# Patient Record
Sex: Male | Born: 1955 | Race: White | Hispanic: No | Marital: Married | Smoking: Former smoker
Health system: Southern US, Community
[De-identification: ages and names within clinical notes are randomized; demographics above are authoritative.]

## PROBLEM LIST (undated history)

## (undated) DIAGNOSIS — L409 Psoriasis, unspecified: Secondary | ICD-10-CM

## (undated) DIAGNOSIS — R350 Frequency of micturition: Secondary | ICD-10-CM

## (undated) DIAGNOSIS — I10 Essential (primary) hypertension: Secondary | ICD-10-CM

## (undated) DIAGNOSIS — F1020 Alcohol dependence, uncomplicated: Secondary | ICD-10-CM

## (undated) DIAGNOSIS — M79671 Pain in right foot: Secondary | ICD-10-CM

## (undated) DIAGNOSIS — M1990 Unspecified osteoarthritis, unspecified site: Secondary | ICD-10-CM

## (undated) DIAGNOSIS — Z8619 Personal history of other infectious and parasitic diseases: Secondary | ICD-10-CM

## (undated) DIAGNOSIS — Z8709 Personal history of other diseases of the respiratory system: Secondary | ICD-10-CM

## (undated) DIAGNOSIS — R351 Nocturia: Secondary | ICD-10-CM

## (undated) DIAGNOSIS — M4800 Spinal stenosis, site unspecified: Secondary | ICD-10-CM

## (undated) HISTORY — PX: APPENDECTOMY: SHX54

## (undated) HISTORY — DX: Pain in right foot: M79.671

## (undated) HISTORY — DX: Alcohol dependence, uncomplicated: F10.20

## (undated) HISTORY — DX: Spinal stenosis, site unspecified: M48.00

## (undated) HISTORY — DX: Psoriasis, unspecified: L40.9

## (undated) HISTORY — PX: WISDOM TOOTH EXTRACTION: SHX21

## (undated) HISTORY — DX: Personal history of other infectious and parasitic diseases: Z86.19

## (undated) HISTORY — PX: KNEE SURGERY: SHX244

## (undated) HISTORY — PX: HERNIA REPAIR: SHX51

## (undated) HISTORY — DX: Essential (primary) hypertension: I10

---

## 2010-11-11 ENCOUNTER — Encounter: Payer: Self-pay | Admitting: Internal Medicine

## 2010-11-11 ENCOUNTER — Ambulatory Visit (INDEPENDENT_AMBULATORY_CARE_PROVIDER_SITE_OTHER): Payer: PRIVATE HEALTH INSURANCE | Admitting: Internal Medicine

## 2010-11-11 DIAGNOSIS — M79671 Pain in right foot: Secondary | ICD-10-CM | POA: Insufficient documentation

## 2010-11-11 DIAGNOSIS — M79609 Pain in unspecified limb: Secondary | ICD-10-CM

## 2010-11-11 DIAGNOSIS — L408 Other psoriasis: Secondary | ICD-10-CM

## 2010-11-11 DIAGNOSIS — L409 Psoriasis, unspecified: Secondary | ICD-10-CM

## 2010-11-11 DIAGNOSIS — Z79899 Other long term (current) drug therapy: Secondary | ICD-10-CM

## 2010-11-11 DIAGNOSIS — G8929 Other chronic pain: Secondary | ICD-10-CM | POA: Insufficient documentation

## 2010-11-11 DIAGNOSIS — E7800 Pure hypercholesterolemia, unspecified: Secondary | ICD-10-CM

## 2010-11-11 DIAGNOSIS — Z125 Encounter for screening for malignant neoplasm of prostate: Secondary | ICD-10-CM

## 2010-11-11 MED ORDER — CALCIPOTRIENE 0.005 % EX CREA: TOPICAL_CREAM | Freq: Two times a day (BID) | CUTANEOUS | Status: DC

## 2010-11-11 MED ORDER — BETAMETHASONE DIPROPIONATE 0.05 % EX OINT: TOPICAL_OINTMENT | Freq: Two times a day (BID) | CUTANEOUS | Status: DC

## 2010-11-11 NOTE — Assessment & Plan Note (Signed)
Stable. Continue celebrex prn. Cautione regarding possible gi adverse effect. Obtain cbc, chem7.

## 2010-11-11 NOTE — Progress Notes (Signed)
  Subjective:    Patient ID: Mario Carpenter, male    DOB: 1955-12-12, 55 y.o.   MRN: [id number]  HPI Pt presents to clinic to establish primary care and for evaluation of hypercholesterolemia. Notes h/o mildly elevated cholesterol without need for medication. No formal hx of HTN but BP are intermittently high normal. Notes 1 d h/o ST, subjective fever and sick exposure. 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 Involved in AA.  Has chronic right foot pain s/p xrays and podiatry evaluation. Takes celebrex prn without GI adverse effect. Has h/o psoriasis mainly affecting elbows, umbilicus, knees and is treated successfully with dovonex and betamethasone topical. Has been evaluated by dermatology in the past. No other complaints.  Reviewed PMH, PSH, medications, allergies, social hx and family hx.    Review of Systems  Constitutional: Negative.   HENT: Negative.   Eyes: Negative.   Respiratory: Negative.   Cardiovascular: Negative.   Gastrointestinal: Negative.   Genitourinary: Negative.   Musculoskeletal: Negative.   Skin: Positive for rash. Negative for color change and pallor.  Neurological: Negative.   Hematological: Negative.   Psychiatric/Behavioral: Negative.        Objective:   Physical Exam  Nursing note and vitals reviewed. Constitutional: He appears well-developed and well-nourished. No distress.  HENT:  Head: Normocephalic and atraumatic.  Right Ear: External ear normal.  Left Ear: External ear normal.  Nose: Nose normal.  Eyes: Conjunctivae are normal. Right eye exhibits no discharge. Left eye exhibits no discharge. No scleral icterus.  Neck: Neck supple. No JVD present. No thyromegaly present.  Cardiovascular: Normal rate, regular rhythm and normal heart sounds.  Exam reveals no gallop and no friction rub.   No murmur heard. Pulmonary/Chest: Effort normal and breath sounds normal. No respiratory distress. He has no wheezes. He has no rales.  Abdominal:  Soft. Bowel sounds are normal. He exhibits no distension. There is no tenderness. There is no rebound and no guarding.  Lymphadenopathy:    He has no cervical adenopathy.  Neurological: He is alert.  Skin: Skin is warm and dry. Rash noted. He is not diaphoretic.  Psychiatric: He has a normal mood and affect.          Assessment & Plan:

## 2010-11-11 NOTE — Assessment & Plan Note (Signed)
Reportedly mild. Discussed low fat diet and regular exercise. Obtain FLP

## 2010-11-11 NOTE — Assessment & Plan Note (Signed)
Stable and mild. RF dovonex and betamethasone

## 2010-11-11 NOTE — Assessment & Plan Note (Signed)
Obtain psa 

## 2010-11-12 DIAGNOSIS — Z125 Encounter for screening for malignant neoplasm of prostate: Secondary | ICD-10-CM

## 2010-11-12 DIAGNOSIS — Z79899 Other long term (current) drug therapy: Secondary | ICD-10-CM

## 2010-11-12 DIAGNOSIS — E7800 Pure hypercholesterolemia, unspecified: Secondary | ICD-10-CM

## 2010-11-12 LAB — BASIC METABOLIC PANEL
BUN: 14 mg/dL (ref 6–23)
Chloride: 105 mEq/L (ref 96–112)
Creatinine, Ser: 1.1 mg/dL (ref 0.4–1.5)
GFR: 74.69 mL/min (ref >60.00)
Glucose, Bld: 101 mg/dL — ABNORMAL HIGH (ref 70–99)
Potassium: 4.7 mEq/L (ref 3.5–5.1)

## 2010-11-12 LAB — CBC WITH DIFFERENTIAL/PLATELET
Basophils Relative: 0.9 % (ref 0.0–3.0)
Eosinophils Absolute: 0.2 10*3/uL (ref 0.0–0.7)
Eosinophils Relative: 4.2 % (ref 0.0–5.0)
Hemoglobin: 14.7 g/dL (ref 13.0–17.0)
Lymphocytes Relative: 29.9 % (ref 12.0–46.0)
MCHC: 34.5 g/dL (ref 30.0–36.0)
Monocytes Relative: 16.6 % — ABNORMAL HIGH (ref 3.0–12.0)
Neutro Abs: 2.8 10*3/uL (ref 1.4–7.7)
Neutrophils Relative %: 48.4 % (ref 43.0–77.0)
RBC: 4.71 Mil/uL (ref 4.22–5.81)
WBC: 5.8 10*3/uL (ref 4.5–10.5)

## 2010-11-12 LAB — LIPID PANEL
Total CHOL/HDL Ratio: 4
Triglycerides: 59 mg/dL (ref 0.0–149.0)

## 2010-11-12 NOTE — Progress Notes (Signed)
Addended by: Rita Ohara on: 11/12/2010 08:11 AM   Modules accepted: Orders

## 2010-11-15 ENCOUNTER — Telehealth: Payer: Self-pay

## 2010-11-15 NOTE — Telephone Encounter (Signed)
Pt aware.

## 2010-11-15 NOTE — Telephone Encounter (Signed)
Message copied by Kyung Rudd on Mon Nov 15, 2010  2:12 PM ------      Message from: Letitia Libra, [place]      Created: Sun Nov 14, 2010  9:28 AM       Labs nl

## 2011-02-10 ENCOUNTER — Telehealth: Payer: Self-pay | Admitting: Internal Medicine

## 2011-02-10 NOTE — Telephone Encounter (Signed)
Triage vm-------------having for 2 days now lasting 30 minutes each, squiggly lines in his vision. Please advise of what to do?

## 2011-02-11 ENCOUNTER — Ambulatory Visit: Payer: PRIVATE HEALTH INSURANCE | Admitting: Internal Medicine

## 2011-02-11 NOTE — Telephone Encounter (Signed)
appt made

## 2011-02-14 ENCOUNTER — Ambulatory Visit: Payer: PRIVATE HEALTH INSURANCE | Admitting: Internal Medicine

## 2011-06-21 ENCOUNTER — Ambulatory Visit (INDEPENDENT_AMBULATORY_CARE_PROVIDER_SITE_OTHER): Payer: PRIVATE HEALTH INSURANCE | Admitting: Internal Medicine

## 2011-06-21 ENCOUNTER — Encounter: Payer: Self-pay | Admitting: Internal Medicine

## 2011-06-21 DIAGNOSIS — J069 Acute upper respiratory infection, unspecified: Secondary | ICD-10-CM

## 2011-06-21 DIAGNOSIS — K121 Other forms of stomatitis: Secondary | ICD-10-CM

## 2011-06-21 DIAGNOSIS — G8929 Other chronic pain: Secondary | ICD-10-CM

## 2011-06-21 DIAGNOSIS — M79609 Pain in unspecified limb: Secondary | ICD-10-CM

## 2011-06-21 DIAGNOSIS — K1370 Unspecified lesions of oral mucosa: Secondary | ICD-10-CM

## 2011-06-21 MED ORDER — TRIAMCINOLONE ACETONIDE 0.1 % MT PSTE: PASTE | OROMUCOSAL | Status: DC

## 2011-06-21 MED ORDER — AMOXICILLIN 875 MG PO TABS: 875.0000 mg | ORAL_TABLET | Freq: Two times a day (BID) | ORAL | Status: AC

## 2011-06-21 MED ORDER — MELOXICAM 7.5 MG PO TABS: ORAL_TABLET | ORAL | Status: AC

## 2011-06-25 DIAGNOSIS — J069 Acute upper respiratory infection, unspecified: Secondary | ICD-10-CM | POA: Insufficient documentation

## 2011-06-25 DIAGNOSIS — K121 Other forms of stomatitis: Secondary | ICD-10-CM | POA: Insufficient documentation

## 2011-06-25 NOTE — Progress Notes (Signed)
  Subjective:    Patient ID: Mario Carpenter, male    DOB: 03/18/1956, 55 y.o.   MRN: [id number]  HPI Pt presents to clinic for evaluation of ST. Notes two week h/o bilateral ear discomfort, fatigue and an oral ulcer located on upper hard palate. Took otc alkaseltzer and tylenol with some relief. No other alleviating or exacerbating factors. No fever, chills, cough, or known sick exposures. Has h/o chronic foot pain for which takes celebrex. Finds celebrex expensive. Tolerates without gi adverse effect. No known h/o pud or cri. No other complaints.  Past Medical History  Diagnosis Date  . Hypertension    Past Surgical History  Procedure Date  . Appendectomy   . Hernia repair   . Knee surgery     reports that he has quit smoking. He has never used smokeless tobacco. He reports that he does not drink alcohol. His drug history not on file. family history includes Diabetes in his son and Heart disease in his mother. No Known Allergies     Review of Systems see hpi     Objective:   Physical Exam  Nursing note and vitals reviewed. Constitutional: He appears well-developed and well-nourished. No distress.  HENT:  Head: Normocephalic and atraumatic.  Right Ear: Tympanic membrane, external ear and ear canal normal.  Left Ear: Tympanic membrane, external ear and ear canal normal.  Nose: Nose normal.  Mouth/Throat: Uvula is midline, oropharynx is clear and moist and mucous membranes are normal. No oropharyngeal exudate.       2mm oral ulcer left upper hard palate.  Eyes: Conjunctivae are normal. Right eye exhibits no discharge. Left eye exhibits no discharge. No scleral icterus.  Neck: Neck supple.  Lymphadenopathy:    He has no cervical adenopathy.  Neurological: He is alert.  Skin: Skin is warm and dry. No rash noted. He is not diaphoretic.  Psychiatric: He has a normal mood and affect.          Assessment & Plan:

## 2011-06-25 NOTE — Assessment & Plan Note (Signed)
Change celebrex to mobic prn for cost consideration.

## 2011-06-25 NOTE — Assessment & Plan Note (Signed)
Given duration of sx's begin abx tx. Followup if no improvement or worsening.

## 2011-06-25 NOTE — Assessment & Plan Note (Signed)
Attempt kenalog in orabase. Followup if no improvement or worsening.

## 2011-08-26 ENCOUNTER — Telehealth: Payer: Self-pay | Admitting: *Deleted

## 2011-08-26 NOTE — Telephone Encounter (Signed)
Patient returned phone call and left voice message and stated that he has not had any ear pain or fever. He denies dizziness. He just has a sensation of fluid in his ear. Call was returned to patient at [phone], no answer.  A detailed voice message was left  Advising patient to [place] a antihistamine per Dr Rodena Medin, and if there is no improvement he was advised to schedule appointment  For evaluation.

## 2011-08-26 NOTE — Telephone Encounter (Signed)
Patient called and left voice message stating he has had fluid in his ear for the past  2 days, and would like to know if Dr Rodena Medin would prescribe something for him

## 2011-08-26 NOTE — Telephone Encounter (Signed)
Call returned to patient at [phone], no answer. A detailed voice message was left for patient to return call with more details regarding his symptoms.

## 2011-09-08 ENCOUNTER — Encounter: Payer: Self-pay | Admitting: Internal Medicine

## 2011-09-08 ENCOUNTER — Ambulatory Visit (INDEPENDENT_AMBULATORY_CARE_PROVIDER_SITE_OTHER): Payer: PRIVATE HEALTH INSURANCE | Admitting: Internal Medicine

## 2011-09-08 DIAGNOSIS — J40 Bronchitis, not specified as acute or chronic: Secondary | ICD-10-CM

## 2011-09-08 MED ORDER — DOXYCYCLINE HYCLATE 100 MG PO TABS: 100.0000 mg | ORAL_TABLET | Freq: Two times a day (BID) | ORAL | Status: AC

## 2011-09-08 NOTE — Assessment & Plan Note (Signed)
Begin doxycycline. Followup if no improvement or worsening.  

## 2011-09-08 NOTE — Progress Notes (Signed)
  Subjective:    Patient ID: Mario Carpenter, male    DOB: 02/17/1956, 56 y.o.   MRN: [id number]  HPI Pt presents to clinic for evaluation of cough. Notes one + week h/o right ear popping and discomfort-improved temporarily with antihistamine. Then developed throat irritation and NP cough for at least the last 4 days. Cough was productive this am for brown sputum without hemoptysis. Taking otc alkaseltzer with some improvement of sx's. No fever, chills, dyspnea or wheezing. No other complaints.  Past Medical History  Diagnosis Date  . Hypertension    Past Surgical History  Procedure Date  . Appendectomy   . Hernia repair   . Knee surgery     reports that he has quit smoking. He has never used smokeless tobacco. He reports that he does not drink alcohol. His drug history not on file. family history includes Diabetes in his son and Heart disease in his mother. No Known Allergies   Review of Systems see hpi     Objective:   Physical Exam  Nursing note and vitals reviewed. Constitutional: He appears well-developed. No distress.  HENT:  Head: Normocephalic and atraumatic.  Right Ear: External ear and ear canal normal. A middle ear effusion is present.  Left Ear: Tympanic membrane, external ear and ear canal normal.  Nose: Nose normal.  Mouth/Throat: Uvula is midline and mucous membranes are normal. Posterior oropharyngeal erythema present. No oropharyngeal exudate, posterior oropharyngeal edema or tonsillar abscesses.  Eyes: Conjunctivae are normal. No scleral icterus.  Neck: Neck supple.  Cardiovascular: Normal rate and normal heart sounds.   Pulmonary/Chest: Effort normal and breath sounds normal. No respiratory distress. He has no wheezes. He has no rales.  Neurological: He is alert.  Skin: Skin is warm and dry. He is not diaphoretic.  Psychiatric: He has a normal mood and affect.          Assessment & Plan:

## 2011-10-20 ENCOUNTER — Telehealth: Payer: Self-pay | Admitting: Internal Medicine

## 2011-10-20 MED ORDER — SILDENAFIL CITRATE 100 MG PO TABS: 100.0000 mg | ORAL_TABLET | Freq: Every day | ORAL | Status: DC | PRN

## 2011-10-20 NOTE — Telephone Encounter (Signed)
Rx refill sent to pharmacy. 

## 2011-10-20 NOTE — Telephone Encounter (Signed)
Please send in refill

## 2011-11-15 ENCOUNTER — Ambulatory Visit: Payer: PRIVATE HEALTH INSURANCE | Admitting: Internal Medicine

## 2012-03-04 ENCOUNTER — Other Ambulatory Visit: Payer: Self-pay | Admitting: Internal Medicine

## 2012-03-05 NOTE — Telephone Encounter (Signed)
Refill sent to pharmacy.   

## 2012-03-05 NOTE — Telephone Encounter (Signed)
Please advise if ok to provide refills and if so, how many? Pt last seen by [place] on 09/08/11 and cancelled 11/2011 appt.   Medication name:  Name from pharmacy:  meloxicam (MOBIC) 7.5 MG tablet  MELOXICAM 7.5MG  TAB ZYDU Sig: TAKE ONE OR TWO TABLETS BY MOUTH DAILY AS NEEDED FOR PAIN. TAKE WITHFOOD Dispense: 60 tablet Refills: 2 Start: 03/04/2012 Class: Normal Requested on: 06/21/2011 Last refill: 02/09/2012

## 2012-03-05 NOTE — Telephone Encounter (Signed)
Ok one month with [employer] [facility]

## 2012-05-29 ENCOUNTER — Ambulatory Visit: Payer: PRIVATE HEALTH INSURANCE | Admitting: Internal Medicine

## 2012-06-22 ENCOUNTER — Telehealth: Payer: Self-pay | Admitting: Internal Medicine

## 2012-06-22 MED ORDER — SILDENAFIL CITRATE 100 MG PO TABS: 100.0000 mg | ORAL_TABLET | Freq: Every day | ORAL | Status: DC | PRN

## 2012-06-22 NOTE — Telephone Encounter (Signed)
Refill viagra 100 mg tab qty 10 take 1 by mouth daily as needed last fill 05-02-2012

## 2012-06-22 NOTE — Telephone Encounter (Signed)
Please advise; re- refills

## 2012-06-22 NOTE — Telephone Encounter (Signed)
OK to send 1 month with 5 refills.

## 2012-09-04 ENCOUNTER — Encounter: Payer: PRIVATE HEALTH INSURANCE | Admitting: Internal Medicine

## 2012-09-12 ENCOUNTER — Encounter: Payer: Self-pay | Admitting: *Deleted

## 2012-09-12 ENCOUNTER — Ambulatory Visit (INDEPENDENT_AMBULATORY_CARE_PROVIDER_SITE_OTHER): Payer: PRIVATE HEALTH INSURANCE | Admitting: Internal Medicine

## 2012-09-12 ENCOUNTER — Encounter: Payer: Self-pay | Admitting: Internal Medicine

## 2012-09-12 VITALS — BP 142/94 | HR 72 | Temp 98.2°F | Resp 16 | Ht 73.25 in | Wt 248.5 lb

## 2012-09-12 DIAGNOSIS — Z23 Encounter for immunization: Secondary | ICD-10-CM

## 2012-09-12 DIAGNOSIS — J40 Bronchitis, not specified as acute or chronic: Secondary | ICD-10-CM

## 2012-09-12 DIAGNOSIS — Z0000 Encounter for general adult medical examination without abnormal findings: Secondary | ICD-10-CM

## 2012-09-12 LAB — HEPATIC FUNCTION PANEL
AST: 28 U/L (ref 0–37)
Alkaline Phosphatase: 47 U/L (ref 39–117)
Bilirubin, Direct: 0.2 mg/dL (ref 0.0–0.3)
Indirect Bilirubin: 1.2 mg/dL — ABNORMAL HIGH (ref 0.0–0.9)
Total Bilirubin: 1.4 mg/dL — ABNORMAL HIGH (ref 0.3–1.2)

## 2012-09-12 LAB — CBC WITH DIFFERENTIAL/PLATELET
Eosinophils Absolute: 0.2 10*3/uL (ref 0.0–0.7)
Eosinophils Relative: 3 % (ref 0–5)
Lymphs Abs: 1.8 10*3/uL (ref 0.7–4.0)
MCH: 29.5 pg (ref 26.0–34.0)
MCV: 84.7 fL (ref 78.0–100.0)
Platelets: 270 10*3/uL (ref 150–400)
RBC: 4.98 MIL/uL (ref 4.22–5.81)

## 2012-09-12 LAB — BASIC METABOLIC PANEL
CO2: 27 mEq/L (ref 19–32)
Calcium: 9.7 mg/dL (ref 8.4–10.5)
Creat: 0.99 mg/dL (ref 0.50–1.35)
Glucose, Bld: 105 mg/dL — ABNORMAL HIGH (ref 70–99)

## 2012-09-12 LAB — PSA: PSA: 1.64 ng/mL (ref <=4.00)

## 2012-09-12 LAB — LIPID PANEL: HDL: 53 mg/dL (ref >39)

## 2012-09-12 LAB — HEMOGLOBIN A1C: Mean Plasma Glucose: 120 mg/dL — ABNORMAL HIGH (ref <117)

## 2012-09-12 MED ORDER — VARDENAFIL HCL 20 MG PO TABS: 20.0000 mg | ORAL_TABLET | Freq: Every day | ORAL | Status: DC | PRN

## 2012-09-13 LAB — URINALYSIS, ROUTINE W REFLEX MICROSCOPIC
Nitrite: NEGATIVE
Specific Gravity, Urine: 1.022 (ref 1.005–1.030)
Urobilinogen, UA: 0.2 mg/dL (ref 0.0–1.0)

## 2012-09-16 DIAGNOSIS — Z0000 Encounter for general adult medical examination without abnormal findings: Secondary | ICD-10-CM | POA: Insufficient documentation

## 2012-09-16 NOTE — Assessment & Plan Note (Signed)
Nl exam. ekg shows nsr with rate of 67. Nl intervals and axis. Obtain cpe labs. Recommend outpt bp log to be submitted for review

## 2012-09-16 NOTE — Progress Notes (Signed)
  Subjective:    Patient ID: Mario Carpenter, male    DOB: Jun 03, 1956, 57 y.o.   MRN: [id number]  HPI Pt presents to clinic for annual exam. BP mildly high without symptoms. Past h/o HTN and took medication but was able to stop. Is beginning cardio exercise program soon.  Past Medical History  Diagnosis Date  . Hypertension    Past Surgical History  Procedure Date  . Appendectomy   . Hernia repair   . Knee surgery     reports that he has quit smoking. He has never used smokeless tobacco. He reports that he does not drink alcohol. His drug history not on file. family history includes Diabetes in his son and Heart disease in his mother. No Known Allergies   Review of Systems see hpi     Objective:   Physical Exam  Physical Exam  Nursing note and vitals reviewed. Constitutional: He appears well-developed and well-nourished. No distress.  HENT:  Head: Normocephalic and atraumatic.  Right Ear: Tympanic membrane and external ear normal.  Left Ear: Tympanic membrane and external ear normal.  Nose: Nose normal.  Mouth/Throat: Uvula is midline, oropharynx is clear and moist and mucous membranes are normal. No oropharyngeal exudate.  Eyes: Conjunctivae and EOM are normal. Pupils are equal, round, and reactive to light. Right eye exhibits no discharge. Left eye exhibits no discharge. No scleral icterus.  Neck: Neck supple. Carotid bruit is not present. No thyromegaly present.  Cardiovascular: Normal rate, regular rhythm and normal heart sounds.  Exam reveals no gallop and no friction rub.   No murmur heard. Pulmonary/Chest: Effort normal and breath sounds normal. No respiratory distress. He has no wheezes. He has no rales.  Abdominal: Soft. He exhibits no distension and no mass. There is no hepatosplenomegaly. There is no tenderness. There is no rebound. Hernia confirmed negative in the right inguinal area and confirmed negative in the left inguinal area.  Lymphadenopathy:    He has  no cervical adenopathy.  Neurological: He is alert.  Skin: Skin is warm and dry. He is not diaphoretic.  Psychiatric: He has a normal mood and affect.        Assessment & Plan:

## 2013-03-13 ENCOUNTER — Ambulatory Visit: Payer: PRIVATE HEALTH INSURANCE | Admitting: Internal Medicine

## 2013-03-20 ENCOUNTER — Encounter: Payer: Self-pay | Admitting: Family

## 2013-03-20 ENCOUNTER — Ambulatory Visit (INDEPENDENT_AMBULATORY_CARE_PROVIDER_SITE_OTHER): Payer: PRIVATE HEALTH INSURANCE | Admitting: Family

## 2013-03-20 ENCOUNTER — Ambulatory Visit (HOSPITAL_BASED_OUTPATIENT_CLINIC_OR_DEPARTMENT_OTHER)
Admission: RE | Admit: 2013-03-20 | Discharge: 2013-03-20 | Disposition: A | Discharge status: Home / Self Care | Payer: PRIVATE HEALTH INSURANCE | Source: Ambulatory Visit | Attending: Family | Admitting: Family

## 2013-03-20 VITALS — BP 140/90 | HR 68 | Temp 98.0°F | Resp 16 | Ht 73.25 in | Wt 243.1 lb

## 2013-03-20 DIAGNOSIS — M542 Cervicalgia: Secondary | ICD-10-CM | POA: Insufficient documentation

## 2013-03-20 DIAGNOSIS — Q762 Congenital spondylolisthesis: Secondary | ICD-10-CM | POA: Insufficient documentation

## 2013-03-20 DIAGNOSIS — M47812 Spondylosis without myelopathy or radiculopathy, cervical region: Secondary | ICD-10-CM | POA: Insufficient documentation

## 2013-03-20 DIAGNOSIS — R209 Unspecified disturbances of skin sensation: Secondary | ICD-10-CM

## 2013-03-20 DIAGNOSIS — W57XXXA Bitten or stung by nonvenomous insect and other nonvenomous arthropods, initial encounter: Secondary | ICD-10-CM

## 2013-03-20 DIAGNOSIS — R200 Anesthesia of skin: Secondary | ICD-10-CM

## 2013-03-20 DIAGNOSIS — M5090 Cervical disc disorder, unspecified, unspecified cervical region: Secondary | ICD-10-CM

## 2013-03-20 DIAGNOSIS — T148 Other injury of unspecified body region: Secondary | ICD-10-CM

## 2013-03-20 NOTE — Patient Instructions (Addendum)
Please complete your neck x ray on the first floor. Follow up in 6 months for complete physical.   Work on low fat/low cholesterol diet.

## 2013-03-20 NOTE — Progress Notes (Signed)
  Subjective:    Patient ID: Arlen Dupuis, male    DOB: 15-Oct-1955, 57 y.o.   MRN: [id number]  HPI  Mr.  Jerolyn Center is a 57 yr old male who presents today with several concerns:  R thumb numbness x 6 months.  His orthopedic doctor told him that he thinks this is due to his neck.  Tick bite 2 months ago.  Thinks that there may be a small piece of the tick imbedded in his skin.  Denies joint pain, fever, rashes.    Review of Systems See HPI  Past Medical History  Diagnosis Date  . Hypertension     History   Social History  . Marital Status: Married    Spouse Name: N/A    Number of Children: N/A  . Years of Education: N/A   Occupational History  . Not on file.   Social History Main Topics  . Smoking status: Former Games developer  . Smokeless tobacco: Never Used  . Alcohol Use: No  . Drug Use: Not on file  . Sexually Active: Not on file   Other Topics Concern  . Not on file   Social History Narrative  . No narrative on file    Past Surgical History  Procedure Laterality Date  . Appendectomy    . Hernia repair    . Knee surgery      Family History  Problem Relation Age of Onset  . Heart disease Mother   . Diabetes Son     type 1    No Known Allergies  Current Outpatient Prescriptions on File Prior to Visit  Medication Sig Dispense Refill  . betamethasone dipropionate (DIPROLENE) 0.05 % ointment Apply topically 2 (two) times daily.  60 g  6  . calcipotriene (DOVONOX) 0.005 % cream Apply topically 2 (two) times daily.  60 g  6  . meloxicam (MOBIC) 7.5 MG tablet TAKE ONE OR TWO TABLETS BY MOUTH DAILY AS NEEDED FOR PAIN. TAKE WITHFOOD  60 tablet  4  . Multiple Vitamin (MULTIVITAMIN) tablet Take 1 tablet by mouth daily.      . vardenafil (LEVITRA) 20 MG tablet Take 1 tablet (20 mg total) by mouth daily as needed for erectile dysfunction.  10 tablet  6   No current facility-administered medications on file prior to visit.    BP 140/90  Pulse 68  Temp(Src) 98  F (36.7 C) (Oral)  Resp 16  Ht 6' 1.25" (1.861 m)  Wt 243 lb 1.3 oz (110.26 kg)  BMI 31.84 kg/m2  SpO2 99%       Objective:   Physical Exam  Constitutional: He appears well-developed and well-nourished. No distress.  Cardiovascular: Normal rate and regular rhythm.   No murmur heard. Pulmonary/Chest: Effort normal and breath sounds normal. No respiratory distress. He has no wheezes. He has no rales. He exhibits no tenderness.  Neurological:  bilatater UE strength is 5/5  Skin:  Small lesion beneath right axilla with dark piece of solid matter in center          Assessment & Plan:

## 2013-03-21 ENCOUNTER — Telehealth: Payer: Self-pay | Admitting: Family

## 2013-03-21 DIAGNOSIS — M5090 Cervical disc disorder, unspecified, unspecified cervical region: Secondary | ICD-10-CM

## 2013-03-21 NOTE — Telephone Encounter (Signed)
Notified pt. He states he does not want to proceed with referral at this time as he doesn't feel his symptoms are "bad enough" at present.

## 2013-03-21 NOTE — Telephone Encounter (Signed)
Pls let pt know that x ray shows some pinched nerves in his neck as well as arthrititis.  Since he is symptomatic, I would recommend that he be evaluated by neurosurgery- pended.

## 2013-03-21 NOTE — Telephone Encounter (Signed)
Noted  

## 2013-03-24 DIAGNOSIS — W57XXXA Bitten or stung by nonvenomous insect and other nonvenomous arthropods, initial encounter: Secondary | ICD-10-CM | POA: Insufficient documentation

## 2013-03-24 DIAGNOSIS — M5090 Cervical disc disorder, unspecified, unspecified cervical region: Secondary | ICD-10-CM | POA: Insufficient documentation

## 2013-03-24 NOTE — Assessment & Plan Note (Signed)
Tic residue was removed using a 22 gauge needle. Monitor.

## 2013-03-24 NOTE — Assessment & Plan Note (Signed)
Likely cause for thumb numbness. C spine x ray as below.  Recommended referral to NS, but pt refuses. See phone note.  1. 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  Disease is less severe at C4-5 and C6-7.  2. Osseous foraminal narrowing is present bilaterally at each of  these levels, most severe at C3-4.

## 2013-05-16 ENCOUNTER — Other Ambulatory Visit: Payer: Self-pay | Admitting: Family

## 2013-05-16 NOTE — Telephone Encounter (Signed)
Received refill from pharmacy for Viagra. Pt currently has levitra listed on med list. Need to verify request. Left message for pt to return my call.

## 2013-05-30 NOTE — Telephone Encounter (Signed)
Pt never returned my call. Looks like he has taken viagra in the past.  Please advise re: request.

## 2013-09-17 ENCOUNTER — Encounter: Payer: PRIVATE HEALTH INSURANCE | Admitting: Family

## 2013-09-20 ENCOUNTER — Ambulatory Visit (INDEPENDENT_AMBULATORY_CARE_PROVIDER_SITE_OTHER): Payer: PRIVATE HEALTH INSURANCE | Admitting: Family

## 2013-09-20 ENCOUNTER — Encounter: Payer: Self-pay | Admitting: Family

## 2013-09-20 VITALS — BP 130/84 | HR 65 | Temp 97.7°F | Resp 16 | Ht 74.5 in | Wt 250.0 lb

## 2013-09-20 DIAGNOSIS — Z0000 Encounter for general adult medical examination without abnormal findings: Secondary | ICD-10-CM

## 2013-09-20 DIAGNOSIS — Z23 Encounter for immunization: Secondary | ICD-10-CM

## 2013-09-20 DIAGNOSIS — Z136 Encounter for screening for cardiovascular disorders: Secondary | ICD-10-CM

## 2013-09-20 LAB — CBC WITH DIFFERENTIAL/PLATELET
BASOS ABS: 0.1 10*3/uL (ref 0.0–0.1)
Basophils Relative: 1 % (ref 0–1)
EOS PCT: 4 % (ref 0–5)
Eosinophils Absolute: 0.2 10*3/uL (ref 0.0–0.7)
HEMATOCRIT: 42.4 % (ref 39.0–52.0)
Hemoglobin: 14.6 g/dL (ref 13.0–17.0)
LYMPHS ABS: 1.6 10*3/uL (ref 0.7–4.0)
LYMPHS PCT: 29 % (ref 12–46)
MCH: 29.5 pg (ref 26.0–34.0)
MCHC: 34.4 g/dL (ref 30.0–36.0)
MCV: 85.7 fL (ref 78.0–100.0)
Monocytes Absolute: 0.6 10*3/uL (ref 0.1–1.0)
Monocytes Relative: 12 % (ref 3–12)
NEUTROS ABS: 2.9 10*3/uL (ref 1.7–7.7)
Neutrophils Relative %: 54 % (ref 43–77)
PLATELETS: 269 10*3/uL (ref 150–400)
RBC: 4.95 MIL/uL (ref 4.22–5.81)
RDW: 13 % (ref 11.5–15.5)
WBC: 5.3 10*3/uL (ref 4.0–10.5)

## 2013-09-20 LAB — BASIC METABOLIC PANEL WITH GFR
BUN: 14 mg/dL (ref 6–23)
CALCIUM: 9.4 mg/dL (ref 8.4–10.5)
CHLORIDE: 101 meq/L (ref 96–112)
CO2: 29 meq/L (ref 19–32)
Creat: 1.03 mg/dL (ref 0.50–1.35)
GFR, EST NON AFRICAN AMERICAN: 80 mL/min
GFR, Est African American: >89 mL/min
Glucose, Bld: 100 mg/dL — ABNORMAL HIGH (ref 70–99)
Potassium: 5 mEq/L (ref 3.5–5.3)
SODIUM: 138 meq/L (ref 135–145)

## 2013-09-20 LAB — HEPATIC FUNCTION PANEL
ALK PHOS: 42 U/L (ref 39–117)
ALT: 32 U/L (ref 0–53)
AST: 24 U/L (ref 0–37)
Albumin: 4.5 g/dL (ref 3.5–5.2)
BILIRUBIN DIRECT: 0.2 mg/dL (ref 0.0–0.3)
BILIRUBIN INDIRECT: 1.2 mg/dL — AB (ref 0.0–0.9)
TOTAL PROTEIN: 6.6 g/dL (ref 6.0–8.3)
Total Bilirubin: 1.4 mg/dL — ABNORMAL HIGH (ref 0.3–1.2)

## 2013-09-20 LAB — LIPID PANEL
CHOL/HDL RATIO: 3.8 ratio
Cholesterol: 175 mg/dL (ref 0–200)
HDL: 46 mg/dL (ref >39)
LDL CALC: 115 mg/dL — AB (ref 0–99)
TRIGLYCERIDES: 68 mg/dL (ref <150)
VLDL: 14 mg/dL (ref 0–40)

## 2013-09-20 NOTE — Progress Notes (Signed)
Pre visit review using our clinic review tool, if applicable. No additional management support is needed unless otherwise documented below in the visit note. 

## 2013-09-20 NOTE — Progress Notes (Signed)
Subjective:    Patient ID: Mario Carpenter, male    DOB: 10/14/1955, 58 y.o.   MRN: [id number]  HPI  Mario Carpenter is a 58 yr old male who presents today for cpx.  Immunizations: ? Last tetanus.  Declines flu vaccine. Last  Diet:diet is unhealthy.   Reports recovering alcoholic x 12 yrs, now eats too much sugar Exercise: got a bike for christmas, joined the gym Colonoscopy: last colo 7 yrs ago- normal per pt.     Review of Systems  Constitutional:       Wt Readings from Last 3 Encounters: 09/20/13 : 250 lb (113.399 kg) 03/20/13 : 243 lb 1.3 oz (110.26 kg) 09/12/12 : 248 lb 8 oz (112.719 kg)   HENT: Negative for hearing loss and rhinorrhea.   Eyes: Negative for visual disturbance.  Respiratory: Negative for cough and shortness of breath.   Cardiovascular: Negative for chest pain.  Gastrointestinal: Negative for nausea, vomiting, diarrhea and blood in stool.  Genitourinary: Negative for difficulty urinating.       Nocturia 1-2 times  Musculoskeletal: Negative for back pain.  Skin: Negative for rash.  Neurological: Negative for headaches.  Hematological:       Notes that his skin tears easily on his forearms with easy bruisibility  Psychiatric/Behavioral:       Denies depression/anxiety.    Past Medical History  Diagnosis Date  . Hypertension     History   Social History  . Marital Status: Married    Spouse Name: N/A    Number of Children: N/A  . Years of Education: N/A   Occupational History  . Not on file.   Social History Main Topics  . Smoking status: Former Research scientist (life sciences)  . Smokeless tobacco: Never Used  . Alcohol Use: No  . Drug Use: Not on file  . Sexual Activity: Not on file   Other Topics Concern  . Not on file   Social History Narrative  . No narrative on file    Past Surgical History  Procedure Laterality Date  . Appendectomy    . Hernia repair    . Knee surgery      Family History  Problem Relation Age of Onset  . Heart disease  Mother   . Diabetes Son     type 1    No Known Allergies  Current Outpatient Prescriptions on File Prior to Visit  Medication Sig Dispense Refill  . betamethasone dipropionate (DIPROLENE) 0.05 % ointment Apply topically 2 (two) times daily.  60 g  6  . calcipotriene (DOVONOX) 0.005 % cream Apply topically 2 (two) times daily.  60 g  6  . meloxicam (MOBIC) 7.5 MG tablet TAKE ONE OR TWO TABLETS BY MOUTH DAILY AS NEEDED FOR PAIN. TAKE WITHFOOD  60 tablet  4  . Multiple Vitamin (MULTIVITAMIN) tablet Take 1 tablet by mouth daily.      . vardenafil (LEVITRA) 20 MG tablet Take 1 tablet (20 mg total) by mouth daily as needed for erectile dysfunction.  10 tablet  6  . VIAGRA 100 MG tablet Take one tablet by mouth daily as needed  10 tablet  4   No current facility-administered medications on file prior to visit.    BP 130/84  Pulse 65  Temp(Src) 97.7 F (36.5 C) (Oral)  Resp 16  Ht 6' 2.5" (1.892 m)  Wt 250 lb (113.399 kg)  BMI 31.68 kg/m2  SpO2 99%       Objective:   Physical Exam  Physical Exam  Constitutional: He is oriented to person, place, and time. He appears well-developed and well-nourished. No distress.  HENT:  Head: Normocephalic and atraumatic.  Right Ear: Tympanic membrane and ear canal normal.  Left Ear: Tympanic membrane and ear canal normal.  Mouth/Throat: Oropharynx is clear and moist.  Eyes: Pupils are equal, round, and reactive to light. No scleral icterus.  Neck: Normal range of motion. No thyromegaly present.  Cardiovascular: Normal rate and regular rhythm.   No murmur heard. Pulmonary/Chest: Effort normal and breath sounds normal. No respiratory distress. He has no wheezes. He has no rales. He exhibits no tenderness.  Abdominal: Soft. Bowel sounds are normal. He exhibits no distension and no mass. There is no tenderness. There is no rebound and no guarding.  Musculoskeletal: He exhibits no edema.  Lymphadenopathy:    He has no cervical adenopathy.    Neurological: He is alert and oriented to person, place, and time. He has normal reflexes. He exhibits normal muscle tone. Coordination normal.  Skin: Skin is warm and dry.  Psychiatric: He has a normal mood and affect. His behavior is normal. Judgment and thought content normal.          Assessment & Plan:         Assessment & Plan:

## 2013-09-20 NOTE — Patient Instructions (Signed)
Please complete lab work prior to leaving. Please work on [employer], weight loss and continue exercise.  Follow up in 1 year, sooner if problems/concern.

## 2013-09-20 NOTE — Assessment & Plan Note (Signed)
Discussed healthy diet, exercise, weight loss. Tdap today.  Declines flu shot. Obtain fasting labs including PSA (discussed pros/cons).

## 2013-09-21 LAB — URINALYSIS
BILIRUBIN URINE: NEGATIVE
Glucose, UA: NEGATIVE mg/dL
HGB URINE DIPSTICK: NEGATIVE
KETONES UR: NEGATIVE mg/dL
Leukocytes, UA: NEGATIVE
Nitrite: NEGATIVE
PROTEIN: NEGATIVE mg/dL
SPECIFIC GRAVITY, URINE: 1.017 (ref 1.005–1.030)
UROBILINOGEN UA: 0.2 mg/dL (ref 0.0–1.0)
pH: 5.5 (ref 5.0–8.0)

## 2013-09-21 LAB — PSA: PSA: 1.45 ng/mL (ref <=4.00)

## 2013-09-21 LAB — TSH: TSH: 2.531 u[IU]/mL (ref 0.350–4.500)

## 2013-09-22 ENCOUNTER — Encounter: Payer: Self-pay | Admitting: Family

## 2014-08-15 ENCOUNTER — Other Ambulatory Visit: Payer: Self-pay | Admitting: Family

## 2014-09-22 ENCOUNTER — Ambulatory Visit (INDEPENDENT_AMBULATORY_CARE_PROVIDER_SITE_OTHER): Payer: Commercial Managed Care - PPO | Admitting: Physician Assistant

## 2014-09-22 ENCOUNTER — Ambulatory Visit (HOSPITAL_BASED_OUTPATIENT_CLINIC_OR_DEPARTMENT_OTHER)
Admission: RE | Admit: 2014-09-22 | Discharge: 2014-09-22 | Disposition: A | Discharge status: Home / Self Care | Payer: Commercial Managed Care - PPO | Source: Ambulatory Visit | Attending: Physician Assistant | Admitting: Physician Assistant

## 2014-09-22 ENCOUNTER — Encounter: Payer: Self-pay | Admitting: Physician Assistant

## 2014-09-22 VITALS — BP 147/78 | HR 68 | Temp 98.2°F | Resp 16 | Ht 73.5 in | Wt 254.5 lb

## 2014-09-22 DIAGNOSIS — M169 Osteoarthritis of hip, unspecified: Secondary | ICD-10-CM | POA: Insufficient documentation

## 2014-09-22 DIAGNOSIS — M25552 Pain in left hip: Secondary | ICD-10-CM

## 2014-09-22 DIAGNOSIS — Z0000 Encounter for general adult medical examination without abnormal findings: Secondary | ICD-10-CM | POA: Insufficient documentation

## 2014-09-22 DIAGNOSIS — L409 Psoriasis, unspecified: Secondary | ICD-10-CM | POA: Diagnosis not present

## 2014-09-22 DIAGNOSIS — Z125 Encounter for screening for malignant neoplasm of prostate: Secondary | ICD-10-CM | POA: Diagnosis not present

## 2014-09-22 DIAGNOSIS — N401 Enlarged prostate with lower urinary tract symptoms: Secondary | ICD-10-CM | POA: Diagnosis not present

## 2014-09-22 DIAGNOSIS — N138 Other obstructive and reflux uropathy: Secondary | ICD-10-CM | POA: Insufficient documentation

## 2014-09-22 LAB — HEPATIC FUNCTION PANEL
ALBUMIN: 4.2 g/dL (ref 3.5–5.2)
ALT: 29 U/L (ref 0–53)
AST: 25 U/L (ref 0–37)
Alkaline Phosphatase: 52 U/L (ref 39–117)
Bilirubin, Direct: 0.2 mg/dL (ref 0.0–0.3)
Total Bilirubin: 1 mg/dL (ref 0.2–1.2)
Total Protein: 6.8 g/dL (ref 6.0–8.3)

## 2014-09-22 LAB — LIPID PANEL
CHOL/HDL RATIO: 4
CHOLESTEROL: 181 mg/dL (ref 0–200)
HDL: 49.6 mg/dL (ref >39.00)
LDL CALC: 120 mg/dL — AB (ref 0–99)
NONHDL: 131.4
Triglycerides: 55 mg/dL (ref 0.0–149.0)
VLDL: 11 mg/dL (ref 0.0–40.0)

## 2014-09-22 LAB — BASIC METABOLIC PANEL
BUN: 16 mg/dL (ref 6–23)
CALCIUM: 9.4 mg/dL (ref 8.4–10.5)
CO2: 27 mEq/L (ref 19–32)
CREATININE: 1.01 mg/dL (ref 0.40–1.50)
Chloride: 105 mEq/L (ref 96–112)
GFR: 80.44 mL/min (ref >60.00)
GLUCOSE: 119 mg/dL — AB (ref 70–99)
Potassium: 4.3 mEq/L (ref 3.5–5.1)
Sodium: 137 mEq/L (ref 135–145)

## 2014-09-22 LAB — URINALYSIS, ROUTINE W REFLEX MICROSCOPIC
Bilirubin Urine: NEGATIVE
HGB URINE DIPSTICK: NEGATIVE
KETONES UR: NEGATIVE
LEUKOCYTES UA: NEGATIVE
Nitrite: NEGATIVE
PH: 6 (ref 5.0–8.0)
RBC / HPF: NONE SEEN (ref 0–?)
Specific Gravity, Urine: 1.02 (ref 1.000–1.030)
TOTAL PROTEIN, URINE-UPE24: NEGATIVE
UROBILINOGEN UA: 0.2 (ref 0.0–1.0)
Urine Glucose: NEGATIVE

## 2014-09-22 LAB — CBC
HEMATOCRIT: 43.9 % (ref 39.0–52.0)
Hemoglobin: 14.5 g/dL (ref 13.0–17.0)
MCHC: 32.9 g/dL (ref 30.0–36.0)
MCV: 89 fl (ref 78.0–100.0)
Platelets: 264 10*3/uL (ref 150.0–400.0)
RBC: 4.94 Mil/uL (ref 4.22–5.81)
RDW: 13.3 % (ref 11.5–15.5)
WBC: 6.1 10*3/uL (ref 4.0–10.5)

## 2014-09-22 LAB — TSH: TSH: 2.67 u[IU]/mL (ref 0.35–4.50)

## 2014-09-22 MED ORDER — MELOXICAM 7.5 MG PO TABS: ORAL_TABLET | ORAL | Status: DC

## 2014-09-22 MED ORDER — CALCIPOTRIENE 0.005 % EX CREA: TOPICAL_CREAM | Freq: Two times a day (BID) | CUTANEOUS | Status: DC

## 2014-09-22 MED ORDER — TAMSULOSIN HCL 0.4 MG PO CAPS: 0.4000 mg | ORAL_CAPSULE | Freq: Every day | ORAL | Status: DC

## 2014-09-22 MED ORDER — BETAMETHASONE DIPROPIONATE 0.05 % EX OINT: TOPICAL_OINTMENT | Freq: Two times a day (BID) | CUTANEOUS | Status: DC

## 2014-09-22 NOTE — Progress Notes (Signed)
Pre visit review using our clinic review tool, if applicable. No additional management support is needed unless otherwise documented below in the visit note/SLS  

## 2014-09-22 NOTE — Progress Notes (Signed)
Patient presents to clinic today for annual exam.  Patient is fasting for labs.  Acute Concerns: Patient complains of left hip pain x 6 months.  Pain is throbbing in nature and intermittent.  No pain at present.  Pain occurring a few times per week.  Is not worsened with exercise or ROM.  Denies numbness or tingling of extremities.  Chronic Issues: Hyperlipidemia -- Noted in chart.  Patient not currently on any medications. Is watching diet and attempting to exercise.  Is fasting for labs today.  Health Maintenance: Dental -- up-to-date Vision -- up-to-date Immunizations -- Tetanus up-to-date.  Patient declines flu shot today. Colonoscopy --  Endorses Colonoscopy in 2007. Due in 2017.  Past Medical History  Diagnosis Date  . Hypertension   . Psoriasis   . Foot pain, right   . History of chicken pox   . Alcoholism     Resolved    Past Surgical History  Procedure Laterality Date  . Appendectomy    . Hernia repair    . Knee surgery    . Wisdom tooth extraction      Current Outpatient Prescriptions on File Prior to Visit  Medication Sig Dispense Refill  . Multiple Vitamin (MULTIVITAMIN) tablet Take 1 tablet by mouth daily.    Marland Kitchen VIAGRA 100 MG tablet TAKE ONE TABLET BY MOUTH DAILY AS NEEDED  10 tablet 3   No current facility-administered medications on file prior to visit.    No Known Allergies  Family History  Problem Relation Age of Onset  . Heart disease Mother 59    Deceased  . Diabetes Son     type 1  . Heart defect Mother   . Diabetes Father 26    Deceased  . Healthy Brother     x1  . Healthy Sister     x2    History   Social History  . Marital Status: Married    Spouse Name: N/A    Number of Children: N/A  . Years of Education: N/A   Occupational History  . Not on file.   Social History Main Topics  . Smoking status: Former Research scientist (life sciences)  . Smokeless tobacco: Never Used  . Alcohol Use: No  . Drug Use: Not on file  . Sexual Activity: Not on file    Other Topics Concern  . Not on file   Social History Narrative   Review of Systems  Constitutional: Negative for fever and weight loss.  HENT: Negative for ear discharge, ear pain, hearing loss and tinnitus.   Eyes: Negative for blurred vision, double vision, photophobia and pain.  Respiratory: Negative for cough and shortness of breath.   Cardiovascular: Negative for chest pain and palpitations.  Gastrointestinal: Negative.   Genitourinary: Positive for frequency. Negative for dysuria, urgency, hematuria and flank pain.       Nocturia x 4-5 per night.  Skin: Negative for rash.  Neurological: Negative for dizziness, loss of consciousness and headaches.  Psychiatric/Behavioral: Negative.    BP 147/78 mmHg  Pulse 68  Temp(Src) 98.2 F (36.8 C) (Oral)  Resp 16  Ht 6' 1.5" (1.867 m)  Wt 254 lb 8 oz (115.44 kg)  BMI 33.12 kg/m2  SpO2 99%  Physical Exam  Constitutional: He is oriented to person, place, and time and well-developed, well-nourished, and in no distress.  HENT:  Head: Normocephalic and atraumatic.  Right Ear: External ear normal.  Left Ear: External ear normal.  Nose: Nose normal.  Mouth/Throat: Oropharynx is  clear and moist. No oropharyngeal exudate.  Eyes: Conjunctivae and EOM are normal. Pupils are equal, round, and reactive to light.  Neck: Neck supple.  Cardiovascular: Normal rate, regular rhythm, normal heart sounds and intact distal pulses.   Pulmonary/Chest: Effort normal and breath sounds normal. No respiratory distress. He has no wheezes. He has no rales. He exhibits no tenderness.  Abdominal: Soft. Bowel sounds are normal. He exhibits no distension and no mass. There is no tenderness. There is no rebound and no guarding.  Musculoskeletal:       Left hip: He exhibits decreased range of motion. He exhibits normal strength, no tenderness, no bony tenderness and no swelling.  Lymphadenopathy:    He has no cervical adenopathy.  Neurological: He is alert  and oriented to person, place, and time. No cranial nerve deficit.  Skin: Skin is warm and dry. No rash noted.  Psychiatric: Affect normal.  Vitals reviewed.    Assessment/Plan: Psoriasis Well-controlled with current regimen.  Medications refilled. Has upcoming appointment with new Dermatologist.   BPH with obstruction/lower urinary tract symptoms Will check PSA today.  DRE within normal limits.  Definite symptoms of enlarged prostate.  Will start Flomax 0.4 mg daily.  Instructions given regarding common side effects of medication.  Follow-up in 1 month.   Visit for preventive health examination I have reviewed the patient's medical history in detail and updated the computerized patient record.  Health Maintenance up-to-date overall.  Declines flu shot.  Will obtain fasting labs today to include PSA. Preventive care discussed with patient. Handout given.  Follow-up 1 month for BPH.   Prostate cancer screening With LUTS.  Symptoms fit BPH. Treatment initiated.  Will check UA and PSA today.   Left hip pain > 6 month duration.  Exam within normal limits except for some decreased lateral ROM of left hip.  Will obtain x-ray today.  Continue Meloxicam as directed.  Apply Aspercreme to the affected area.

## 2014-09-22 NOTE — Assessment & Plan Note (Signed)
Will check PSA today.  DRE within normal limits.  Definite symptoms of enlarged prostate.  Will start Flomax 0.4 mg daily.  Instructions given regarding common side effects of medication.  Follow-up in 1 month.

## 2014-09-22 NOTE — Patient Instructions (Signed)
Please go to the lab for blood work. I will call you with your results.  Continue medications as directed.    For the enlarged prostate symptoms, please take Flomax daily.  I recommend the first dose be at bedtime as the first dose can cause lightheadedness. Continue Viagra as directed.  Follow-up in 1 month.  If PSA level is elevated, we will set you up with Urology.  Please read information below on the DASH diet which will help lower your BP. Will recheck your pressure at your follow-up in 1 month.  DASH Eating Plan DASH stands for "Dietary Approaches to Stop Hypertension." The DASH eating plan is a healthy eating plan that has been shown to reduce high blood pressure (hypertension). Additional health benefits may include reducing the risk of type 2 diabetes mellitus, heart disease, and stroke. The DASH eating plan may also help with weight loss. WHAT DO I NEED TO KNOW ABOUT THE DASH EATING PLAN? For the DASH eating plan, you will follow these general guidelines:  Choose foods with a percent daily value for sodium of less than 5% (as listed on the food label).  Use salt-free seasonings or herbs instead of table salt or sea salt.  Check with your health care provider or pharmacist before using salt substitutes.  Eat lower-sodium products, often labeled as "lower sodium" or "no salt added."  Eat fresh foods.  Eat more vegetables, fruits, and low-fat dairy products.  Choose whole grains. Look for the word "whole" as the first word in the ingredient list.  Choose fish and skinless chicken or [place] more often than red meat. Limit fish, poultry, and meat to 6 oz (170 g) each day.  Limit sweets, desserts, sugars, and sugary drinks.  Choose heart-healthy fats.  Limit cheese to 1 oz (28 g) per day.  Eat more home-cooked food and less restaurant, buffet, and fast food.  Limit fried foods.  Cook foods using methods other than frying.  Limit canned vegetables. If you do use them, rinse  them well to decrease the sodium.  When eating at a restaurant, ask that your food be prepared with less salt, or no salt if possible. WHAT FOODS CAN I EAT? Seek help from a dietitian for individual calorie needs. Grains Whole grain or whole wheat bread. Brown rice. Whole grain or whole wheat pasta. Quinoa, bulgur, and whole grain cereals. Low-sodium cereals. Corn or whole wheat flour tortillas. Whole grain cornbread. Whole grain crackers. Low-sodium crackers. Vegetables Fresh or frozen vegetables (raw, steamed, roasted, or grilled). Low-sodium or reduced-sodium tomato and vegetable juices. Low-sodium or reduced-sodium tomato sauce and paste. Low-sodium or reduced-sodium canned vegetables.  Fruits All fresh, canned (in natural juice), or frozen fruits. Meat and Other Protein Products Ground beef (85% or leaner), grass-fed beef, or beef trimmed of fat. Skinless chicken or [place]. Ground chicken or [place]. Pork trimmed of fat. All fish and seafood. Eggs. Dried beans, peas, or lentils. Unsalted nuts and seeds. Unsalted canned beans. Dairy Low-fat dairy products, such as skim or 1% milk, 2% or reduced-fat cheeses, low-fat ricotta or cottage cheese, or plain low-fat yogurt. Low-sodium or reduced-sodium cheeses. Fats and Oils Tub margarines without trans fats. Light or reduced-fat mayonnaise and salad dressings (reduced sodium). Avocado. Safflower, olive, or canola oils. Natural peanut or almond butter. Other Unsalted popcorn and pretzels. The items listed above may not be a complete list of recommended foods or beverages. Contact your dietitian for more options. WHAT FOODS ARE NOT RECOMMENDED? Grains White bread. White pasta.  White rice. Refined cornbread. Bagels and croissants. Crackers that contain trans fat. Vegetables Creamed or fried vegetables. Vegetables in a cheese sauce. Regular canned vegetables. Regular canned tomato sauce and paste. Regular tomato and vegetable juices. Fruits Dried  fruits. Canned fruit in light or heavy syrup. Fruit juice. Meat and Other Protein Products Fatty cuts of meat. Ribs, chicken wings, bacon, sausage, bologna, salami, chitterlings, fatback, hot dogs, bratwurst, and packaged luncheon meats. Salted nuts and seeds. Canned beans with salt. Dairy Whole or 2% milk, cream, half-and-half, and cream cheese. Whole-fat or sweetened yogurt. Full-fat cheeses or blue cheese. Nondairy creamers and whipped toppings. Processed cheese, cheese spreads, or cheese curds. Condiments Onion and garlic salt, seasoned salt, table salt, and sea salt. Canned and packaged gravies. Worcestershire sauce. Tartar sauce. Barbecue sauce. Teriyaki sauce. Soy sauce, including reduced sodium. Steak sauce. Fish sauce. Oyster sauce. Cocktail sauce. Horseradish. Ketchup and mustard. Meat flavorings and tenderizers. Bouillon cubes. Hot sauce. Tabasco sauce. Marinades. Taco seasonings. Relishes. Fats and Oils Butter, stick margarine, lard, shortening, ghee, and bacon fat. Coconut, palm kernel, or palm oils. Regular salad dressings. Other Pickles and olives. Salted popcorn and pretzels. The items listed above may not be a complete list of foods and beverages to avoid. Contact your dietitian for more information. WHERE CAN I FIND MORE INFORMATION? National Heart, Lung, and Blood Institute: travelstabloid.com Document Released: 08/11/2011 Document Revised: 01/06/2014 Document Reviewed: 06/26/2013 [facility] Patient Information 2015 [place], [place]. This information is not intended to replace advice given to you by your health care provider. Make sure you discuss any questions you have with your health care provider.

## 2014-09-22 NOTE — Assessment & Plan Note (Signed)
Well-controlled with current regimen.  Medications refilled. Has upcoming appointment with new Dermatologist.

## 2014-09-22 NOTE — Assessment & Plan Note (Signed)
I have reviewed the patient's medical history in detail and updated the computerized patient record.  Health Maintenance up-to-date overall.  Declines flu shot.  Will obtain fasting labs today to include PSA. Preventive care discussed with patient. Handout given.  Follow-up 1 month for BPH.

## 2014-09-22 NOTE — Assessment & Plan Note (Signed)
>   6 month duration.  Exam within normal limits except for some decreased lateral ROM of left hip.  Will obtain x-ray today.  Continue Meloxicam as directed.  Apply Aspercreme to the affected area.

## 2014-09-22 NOTE — Assessment & Plan Note (Signed)
With LUTS.  Symptoms fit BPH. Treatment initiated.  Will check UA and PSA today.

## 2014-09-23 ENCOUNTER — Telehealth: Payer: Self-pay | Admitting: *Deleted

## 2014-09-23 ENCOUNTER — Other Ambulatory Visit (INDEPENDENT_AMBULATORY_CARE_PROVIDER_SITE_OTHER): Payer: Commercial Managed Care - PPO

## 2014-09-23 DIAGNOSIS — R739 Hyperglycemia, unspecified: Secondary | ICD-10-CM | POA: Diagnosis not present

## 2014-09-23 LAB — PSA, TOTAL AND FREE
PSA, Free Pct: 18 % — ABNORMAL LOW (ref >25)
PSA, Free: 0.35 ng/mL
PSA: 1.96 ng/mL (ref <=4.00)

## 2014-09-23 LAB — HEMOGLOBIN A1C: HEMOGLOBIN A1C: 6 % (ref 4.6–6.5)

## 2014-09-23 NOTE — Telephone Encounter (Signed)
-----   Message from Brunetta Jeans, PA-C sent at 09/23/2014  7:02 AM EST ----- Labs look good overall.  His fasting glucose was slightly elevated.  Can we add-on an A1C to this?  I want him to limit intake of carbs and increase aerobic exercise. Follow-up as scheduled.  Will call with A1C result once in.

## 2014-09-23 NOTE — Telephone Encounter (Signed)
Add on form faxed to [employer] and detailed message left on pt's voicemail re: below results and instructions and to call if any further questions.  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 Continue Mobic as directed. Apply topical Aspercreme to the area. Follow-up with Orthopedist.

## 2014-10-24 ENCOUNTER — Ambulatory Visit: Payer: Commercial Managed Care - PPO | Admitting: Physician Assistant

## 2014-11-07 ENCOUNTER — Ambulatory Visit: Payer: Commercial Managed Care - PPO | Admitting: Physician Assistant

## 2014-12-08 ENCOUNTER — Ambulatory Visit (INDEPENDENT_AMBULATORY_CARE_PROVIDER_SITE_OTHER): Payer: Commercial Managed Care - PPO | Admitting: Physician Assistant

## 2014-12-08 ENCOUNTER — Encounter: Payer: Self-pay | Admitting: Physician Assistant

## 2014-12-08 VITALS — HR 70 | Temp 98.1°F | Resp 16 | Ht 73.25 in | Wt 250.2 lb

## 2014-12-08 DIAGNOSIS — N138 Other obstructive and reflux uropathy: Secondary | ICD-10-CM

## 2014-12-08 DIAGNOSIS — R030 Elevated blood-pressure reading, without diagnosis of hypertension: Secondary | ICD-10-CM | POA: Diagnosis not present

## 2014-12-08 DIAGNOSIS — R05 Cough: Secondary | ICD-10-CM

## 2014-12-08 DIAGNOSIS — IMO0001 Reserved for inherently not codable concepts without codable children: Secondary | ICD-10-CM

## 2014-12-08 DIAGNOSIS — R058 Other specified cough: Secondary | ICD-10-CM

## 2014-12-08 DIAGNOSIS — I10 Essential (primary) hypertension: Secondary | ICD-10-CM | POA: Insufficient documentation

## 2014-12-08 DIAGNOSIS — N401 Enlarged prostate with lower urinary tract symptoms: Secondary | ICD-10-CM | POA: Diagnosis not present

## 2014-12-08 NOTE — Progress Notes (Signed)
Pre visit review using our clinic review tool, if applicable. No additional management support is needed unless otherwise documented below in the visit note/SLS  

## 2014-12-08 NOTE — Assessment & Plan Note (Signed)
Markedly improved. Will continue Flomax daily. Supportive measures discussed. We'll monitor PSA yearly.

## 2014-12-08 NOTE — Assessment & Plan Note (Signed)
Resolved. Supportive measures discussed with patient. Follow-up if symptoms recur.

## 2014-12-08 NOTE — Patient Instructions (Signed)
Please continue current medication regimen. I'm glad your urinary symptoms are much improved with the Flomax.  For your blood pressure, it is still a bit elevated. Please make sure to limit her salt intake and increase your aerobic exercise. Our goal is 30 minutes 3 times a week for now. Please read the information below.  Follow-up in 6 months for blood pressure

## 2014-12-08 NOTE — Progress Notes (Signed)
Patient presents to clinic today c/o mild, intermittent dry cough noticed over the past week associated with some mild rhinorrhea. Patient states that symptoms were still present when he scheduled his appointment yesterday, but now have completely resolved.  Denies fever, chills, chest congestion or shortness of breath symptoms. Denies recent travel or sick contact.  Patient also followed for elevated blood pressure. Endorses he has been trying to watch his diet. Has not been exercising area blood pressure mildly elevated today in clinic. Patient denies chest pain, palpitations, lightheadedness, dizziness, vision changes or frequent headaches.  Patient also followed for BPH after starting Flomax 0.4 mg daily. Patient endorses marked improvement in symptoms with this medication. Nocturia has reduced from 4 times per night to 1 denies urinary hesitancy or postvoid dribbling.  Past Medical History  Diagnosis Date  . Hypertension   . Psoriasis   . Foot pain, right   . History of chicken pox   . Alcoholism     Resolved    Current Outpatient Prescriptions on File Prior to Visit  Medication Sig Dispense Refill  . betamethasone dipropionate (DIPROLENE) 0.05 % ointment Apply topically 2 (two) times daily. (Patient taking differently: Apply topically 2 (two) times daily as needed. ) 60 g 6  . calcipotriene (DOVONOX) 0.005 % cream Apply topically 2 (two) times daily. (Patient taking differently: Apply topically 2 (two) times daily as needed. ) 60 g 6  . meloxicam (MOBIC) 7.5 MG tablet TAKE ONE OR TWO TABLETS BY MOUTH DAILY AS NEEDED FOR PAIN. TAKE WITHFOOD 60 tablet 0  . Multiple Vitamin (MULTIVITAMIN) tablet Take 1 tablet by mouth daily.    . Nutritional Supplements (COMPLETE PROTEIN/VITAMIN SHAKE PO) Take by mouth as directed.    . Probiotic Product (PROBIOTIC DAILY) CAPS Take by mouth daily.    . tamsulosin (FLOMAX) 0.4 MG CAPS capsule Take 1 capsule (0.4 mg total) by mouth daily. 30 capsule 3    . VIAGRA 100 MG tablet TAKE ONE TABLET BY MOUTH DAILY AS NEEDED  10 tablet 3   No current facility-administered medications on file prior to visit.    No Known Allergies  Family History  Problem Relation Age of Onset  . Heart disease Mother 52    Deceased  . Diabetes Son     type 1  . Heart defect Mother   . Diabetes Father 41    Deceased  . Healthy Brother     x1  . Healthy Sister     x2    History   Social History  . Marital Status: Married    Spouse Name: N/A  . Number of Children: N/A  . Years of Education: N/A   Social History Main Topics  . Smoking status: Former Research scientist (life sciences)  . Smokeless tobacco: Never Used  . Alcohol Use: No  . Drug Use: Not on file  . Sexual Activity: Not on file   Other Topics Concern  . None   Social History Narrative    Review of Systems - See HPI.  All other ROS are negative.  Pulse 70  Temp(Src) 98.1 F (36.7 C) (Oral)  Resp 16  Ht 6' 1.25" (1.861 m)  Wt 250 lb 4 oz (113.513 kg)  BMI 32.78 kg/m2  SpO2 99%  Physical Exam  Constitutional: He is oriented to person, place, and time and well-developed, well-nourished, and in no distress.  HENT:  Head: Normocephalic and atraumatic.  Right Ear: External ear normal.  Left Ear: External ear normal.  Nose: Nose  normal.  Mouth/Throat: Oropharynx is clear and moist. No oropharyngeal exudate.  Tympanic membranes within normal limits bilaterally.  Eyes: Conjunctivae are normal.  Neck: Neck supple.  Cardiovascular: Normal rate, regular rhythm, normal heart sounds and intact distal pulses.   Pulmonary/Chest: Effort normal and breath sounds normal. No respiratory distress. He has no wheezes. He has no rales. He exhibits no tenderness.  Neurological: He is alert and oriented to person, place, and time.  Skin: Skin is warm and dry. No rash noted.  Psychiatric: Affect normal.  Vitals reviewed.   Recent Results (from the past 2160 hour(s))  CBC     Status: None   Collection Time:  09/22/14  8:21 AM  Result Value Ref Range   WBC 6.1 4.0 - 10.5 K/uL   RBC 4.94 4.22 - 5.81 Mil/uL   Platelets 264.0 150.0 - 400.0 K/uL   Hemoglobin 14.5 13.0 - 17.0 g/dL   HCT 43.9 39.0 - 52.0 %   MCV 89.0 78.0 - 100.0 fl   MCHC 32.9 30.0 - 36.0 g/dL   RDW 13.3 11.5 - 40.9 %  Basic metabolic panel     Status: Abnormal   Collection Time: 09/22/14  8:21 AM  Result Value Ref Range   Sodium 137 135 - 145 mEq/L   Potassium 4.3 3.5 - 5.1 mEq/L   Chloride 105 96 - 112 mEq/L   CO2 27 19 - 32 mEq/L   Glucose, Bld 119 (H) 70 - 99 mg/dL   BUN 16 6 - 23 mg/dL   Creatinine, Ser 1.01 0.40 - 1.50 mg/dL   Calcium 9.4 8.4 - 10.5 mg/dL   GFR 80.44 >60.00 mL/min  Hepatic function panel     Status: None   Collection Time: 09/22/14  8:21 AM  Result Value Ref Range   Total Bilirubin 1.0 0.2 - 1.2 mg/dL   Bilirubin, Direct 0.2 0.0 - 0.3 mg/dL   Alkaline Phosphatase 52 39 - 117 U/L   AST 25 0 - 37 U/L   ALT 29 0 - 53 U/L   Total Protein 6.8 6.0 - 8.3 g/dL   Albumin 4.2 3.5 - 5.2 g/dL  Lipid panel     Status: Abnormal   Collection Time: 09/22/14  8:21 AM  Result Value Ref Range   Cholesterol 181 0 - 200 mg/dL    Comment: ATP III Classification       Desirable:  < 200 mg/dL               Borderline High:  200 - 239 mg/dL          High:  > = 240 mg/dL   Triglycerides 55.0 0.0 - 149.0 mg/dL    Comment: Normal:  <150 mg/dLBorderline High:  150 - 199 mg/dL   HDL 49.60 >39.00 mg/dL   VLDL 11.0 0.0 - 40.0 mg/dL   LDL Cholesterol 120 (H) 0 - 99 mg/dL   Total CHOL/HDL Ratio 4     Comment:                Men          Women1/2 Average Risk     3.4          3.3Average Risk          5.0          4.42X Average Risk          9.6          7.13X Average Risk  15.0          11.0                       NonHDL 131.40     Comment: NOTE:  Non-HDL goal should be 30 mg/dL higher than patient's LDL goal (i.e. LDL goal of < 70 mg/dL, would have non-HDL goal of < 100 mg/dL)  TSH     Status: None   Collection Time:  09/22/14  8:21 AM  Result Value Ref Range   TSH 2.67 0.35 - 4.50 uIU/mL  Urinalysis, Routine w reflex microscopic     Status: None   Collection Time: 09/22/14  8:21 AM  Result Value Ref Range   Color, Urine YELLOW Yellow;Lt. Yellow   APPearance CLEAR Clear   Specific Gravity, Urine 1.020 1.000-1.030   pH 6.0 5.0 - 8.0   Total Protein, Urine NEGATIVE Negative   Urine Glucose NEGATIVE Negative   Ketones, ur NEGATIVE Negative   Bilirubin Urine NEGATIVE Negative   Hgb urine dipstick NEGATIVE Negative   Urobilinogen, UA 0.2 0.0 - 1.0   Leukocytes, UA NEGATIVE Negative   Nitrite NEGATIVE Negative   WBC, UA 0-2/hpf 0-2/hpf   RBC / HPF none seen 0-2/hpf   Squamous Epithelial / LPF Rare(0-4/hpf) Rare(0-4/hpf)  PSA, total and free     Status: Abnormal   Collection Time: 09/22/14  8:26 AM  Result Value Ref Range   PSA 1.96 <=4.00 ng/mL    Comment: Test Methodology: ECLIA PSA (Electrochemiluminescence Immunoassay)   For PSA values from 2.5-4.0, particularly in younger men <45 years old, the AUA and NCCN suggest testing for % Free PSA (3515) and evaluation of the rate of increase in PSA (PSA velocity).    PSA, Free 0.35 ng/mL   PSA, Free Pct 18 (L) >25 %    Comment:                        Probability of Prostate Cancer   (For Men with Non-Suspicious DRE Results and PSA Between 4 and   10 ng/mL, By Patient Age)     % free PSA                          Patient Age                          48 to 2 Years  22 to 56 Years  >70 Years    <=10%                  49.2%           57.5%          64.5%    11 - 18%               26.9%           33.9%          40.8%    19 - 25%               18.3%           23.9%          29.7%    >25%                    9.1%           12.2%  15.8%   Hemoglobin A1c     Status: None   Collection Time: 09/23/14  3:07 PM  Result Value Ref Range   Hgb A1c MFr Bld 6.0 4.6 - 6.5 %    Comment: Glycemic Control Guidelines for People with Diabetes:Non Diabetic:   <6%Goal of Therapy: <7%Additional Action Suggested:  >8%     Assessment/Plan: BPH with obstruction/lower urinary tract symptoms Markedly improved. Will continue Flomax daily. Supportive measures discussed. We'll monitor PSA yearly.   Elevated BP Increase aerobic exercise. Current goal is 30 minutes of exercise 3 times per week. DASH diet discussed. Handout given. Will routinely monitor.   Allergic cough Resolved. Supportive measures discussed with patient. Follow-up if symptoms recur.

## 2014-12-08 NOTE — Assessment & Plan Note (Signed)
Increase aerobic exercise. Current goal is 30 minutes of exercise 3 times per week. DASH diet discussed. Handout given. Will routinely monitor.

## 2015-02-08 ENCOUNTER — Other Ambulatory Visit: Payer: Self-pay | Admitting: Physician Assistant

## 2015-02-17 ENCOUNTER — Other Ambulatory Visit: Payer: Self-pay | Admitting: Physician Assistant

## 2015-02-19 ENCOUNTER — Telehealth: Payer: Self-pay | Admitting: *Deleted

## 2015-02-19 MED ORDER — TADALAFIL 10 MG PO TABS
10.0000 mg | ORAL_TABLET | Freq: Every day | ORAL | Status: DC | PRN
Start: 2015-02-19 — End: 2015-09-08

## 2015-02-19 NOTE — Telephone Encounter (Signed)
Notified pt. He is agreeable to try Cialis. Rx sent. Med list updated.

## 2015-02-19 NOTE — Telephone Encounter (Signed)
Ok to send in Cialis 10 mg Quantity 8 with 2 refills.

## 2015-02-19 NOTE — Telephone Encounter (Signed)
Left message for pt to return my call.

## 2015-02-19 NOTE — Telephone Encounter (Signed)
Received fax from [facility]. that Viagra is not covered by insurance. Spoke with Fara Olden at [facility], [phone]  ID# [id number]. Viagra is not covered. Preferred alternative is Cialis #8 per 30 days, any strength.  Please advise.

## 2015-03-17 ENCOUNTER — Other Ambulatory Visit: Payer: Self-pay | Admitting: Physician Assistant

## 2015-03-17 NOTE — Telephone Encounter (Signed)
Will defer further refills to PCP. 

## 2015-05-07 ENCOUNTER — Other Ambulatory Visit: Payer: Self-pay | Admitting: Physician Assistant

## 2015-05-07 NOTE — Telephone Encounter (Signed)
Last filled: 03/17/15 Amt: 60, 0  Last OV: 12/08/14 Next appt: 06/09/15  Med filled x 30 days.

## 2015-06-09 ENCOUNTER — Ambulatory Visit: Payer: Commercial Managed Care - PPO | Admitting: Physician Assistant

## 2015-06-14 ENCOUNTER — Other Ambulatory Visit: Payer: Self-pay | Admitting: Family

## 2015-06-15 NOTE — Telephone Encounter (Signed)
Please advise below request:  Name from pharmacy:  In chart as:  MELOXICAM 7.5 MG TABLET meloxicam (MOBIC) 7.5 MG tablet     Sig: TAKE 1 TABLET BY MOUTH ONCE TO TWICE A DAY AS NEEDED    Dispense: 60 tablet   Refills: 0   Start: 06/14/2015   Class: Normal    Requested on: 05/07/2015    Originally ordered on: 03/05/2012 05/07/2015

## 2015-06-16 NOTE — Telephone Encounter (Signed)
Refill was sent, however he is due for follow up to check his BP. thanks

## 2015-06-16 NOTE — Telephone Encounter (Signed)
Please call pt to schedule below appt.  Thanks!

## 2015-06-17 ENCOUNTER — Telehealth: Payer: Self-pay | Admitting: Family

## 2015-06-17 NOTE — Telephone Encounter (Signed)
Pt left VM 06/17/15 10:05am that he needs appt. Called and left msg for pt to call back.

## 2015-06-17 NOTE — Telephone Encounter (Signed)
Lm for pt to call and schedule follow up appt.

## 2015-07-18 ENCOUNTER — Other Ambulatory Visit: Payer: Self-pay | Admitting: Family

## 2015-08-25 ENCOUNTER — Telehealth: Payer: Self-pay | Admitting: Physician Assistant

## 2015-08-25 NOTE — Telephone Encounter (Signed)
LVM for pt to call back to schedule appt

## 2015-08-25 NOTE — Telephone Encounter (Signed)
Please call pt tomorrow if he has not called back to schedule appt. Thanks!

## 2015-08-25 NOTE — Telephone Encounter (Signed)
30 day supply of Flomax sent to pharmacy. Pt past due for follow up of BP. Please call pt to schedule appt w/ Melissa soon.

## 2015-08-25 NOTE — Telephone Encounter (Signed)
Will defer further refills of patient's medications to PCP  

## 2015-08-28 NOTE — Telephone Encounter (Signed)
Scheduled pt for 09/08/15 at 8a.

## 2015-09-08 ENCOUNTER — Ambulatory Visit (INDEPENDENT_AMBULATORY_CARE_PROVIDER_SITE_OTHER): Payer: Commercial Managed Care - PPO | Admitting: Family

## 2015-09-08 ENCOUNTER — Other Ambulatory Visit: Payer: Self-pay | Admitting: Family

## 2015-09-08 ENCOUNTER — Encounter: Payer: Self-pay | Admitting: Family

## 2015-09-08 VITALS — BP 160/90 | HR 68 | Temp 98.0°F | Resp 18

## 2015-09-08 DIAGNOSIS — G8929 Other chronic pain: Secondary | ICD-10-CM

## 2015-09-08 DIAGNOSIS — M79671 Pain in right foot: Secondary | ICD-10-CM

## 2015-09-08 DIAGNOSIS — N138 Other obstructive and reflux uropathy: Secondary | ICD-10-CM

## 2015-09-08 DIAGNOSIS — N401 Enlarged prostate with lower urinary tract symptoms: Secondary | ICD-10-CM

## 2015-09-08 DIAGNOSIS — E7800 Pure hypercholesterolemia, unspecified: Secondary | ICD-10-CM

## 2015-09-08 DIAGNOSIS — I10 Essential (primary) hypertension: Secondary | ICD-10-CM

## 2015-09-08 MED ORDER — AMLODIPINE BESYLATE 5 MG PO TABS: 5.0000 mg | ORAL_TABLET | Freq: Every day | ORAL | Status: DC

## 2015-09-08 MED ORDER — ASPIRIN EC 81 MG PO TBEC: 81.0000 mg | DELAYED_RELEASE_TABLET | Freq: Every day | ORAL | Status: DC

## 2015-09-08 MED ORDER — VARDENAFIL HCL 10 MG PO TABS: 10.0000 mg | ORAL_TABLET | Freq: Every day | ORAL | Status: DC | PRN

## 2015-09-08 NOTE — Patient Instructions (Addendum)
Start amlodipine once daily for blood pressure. Add aspirin 81mg  once daily. Try levitra- let us know if insurance does not cover. Work on low sodium diet, exercise, weight loss.

## 2015-09-08 NOTE — Assessment & Plan Note (Signed)
Stable on flomax, continue same.  

## 2015-09-08 NOTE — Progress Notes (Signed)
Subjective:    Patient ID: Mario Carpenter, male    DOB: 04-Nov-1955, 60 y.o.   MRN: [id number]  HPI  [name] is a 60 yr old male who presents today for follow up.  1) HTN- not currently on meds. Admits to non-compliance with diet and exercise. BP Readings from Last 3 Encounters:  09/08/15 160/90  09/22/14 147/78  09/20/13 130/84   2) Hyperlipidemia- Not currently on statin.   Lab Results  Component Value Date   CHOL 181 09/22/2014   HDL 49.60 09/22/2014   LDLCALC 120* 09/22/2014   TRIG 55.0 09/22/2014   CHOLHDL 4 09/22/2014   3) BPH- maintained on flomax. Reports that it helps his urinary symptoms.   4) R foot pain- uses meloxicam 1-2 times daily which helps.    5) ED- no improvement with cialis, has had success in the past with levitra.   Review of Systems See HPI  Past Medical History  Diagnosis Date  . Hypertension   . Psoriasis   . Foot pain, right   . History of chicken pox   . Alcoholism [facility])     Resolved    Social History   Social History  . Marital Status: Married    Spouse Name: N/A  . Number of Children: N/A  . Years of Education: N/A   Occupational History  . Not on file.   Social History Main Topics  . Smoking status: Former Research scientist (life sciences)  . Smokeless tobacco: Never Used  . Alcohol Use: No  . Drug Use: Not on file  . Sexual Activity: Not on file   Other Topics Concern  . Not on file   Social History Narrative    Past Surgical History  Procedure Laterality Date  . Appendectomy    . Hernia repair    . Knee surgery    . Wisdom tooth extraction      Family History  Problem Relation Age of Onset  . Heart disease Mother 27    Deceased  . Diabetes Son     type 1  . Heart defect Mother   . Diabetes Father 29    Deceased  . Healthy Brother     x1  . Healthy Sister     x2    No Known Allergies  Current Outpatient Prescriptions on File Prior to Visit  Medication Sig Dispense Refill  . betamethasone dipropionate  (DIPROLENE) 0.05 % ointment Apply topically 2 (two) times daily. (Patient taking differently: Apply topically 2 (two) times daily as needed. ) 60 g 6  . calcipotriene (DOVONOX) 0.005 % cream Apply topically 2 (two) times daily. (Patient taking differently: Apply topically 2 (two) times daily as needed. ) 60 g 6  . meloxicam (MOBIC) 7.5 MG tablet TAKE 1 TABLET BY MOUTH ONCE TO TWICE A DAY AS NEEDED 60 tablet 0  . Probiotic Product (PROBIOTIC DAILY) CAPS Take by mouth daily.    . tamsulosin (FLOMAX) 0.4 MG CAPS capsule TAKE ONE CAPSULE BY MOUTH ONE TIME DAILY 30 capsule 1  . Multiple Vitamin (MULTIVITAMIN) tablet Take 1 tablet by mouth daily. Reported on 09/08/2015    . Nutritional Supplements (COMPLETE PROTEIN/VITAMIN SHAKE PO) Take by mouth as directed. Reported on 09/08/2015     No current facility-administered medications on file prior to visit.    BP 160/90 mmHg  Pulse 68  Temp(Src) 98 F (36.7 C) (Oral)  Resp 18  SpO2 98%       Objective:   Physical Exam  Constitutional: He is oriented to person, place, and time. He appears well-developed and well-nourished. No distress.  HENT:  Head: Normocephalic and atraumatic.  Cardiovascular: Normal rate and regular rhythm.   No murmur heard. Pulmonary/Chest: Effort normal and breath sounds normal. No respiratory distress. He has no wheezes. He has no rales.  Neurological: He is alert and oriented to person, place, and time.  Skin: Skin is warm and dry.  Psychiatric: He has a normal mood and affect. His behavior is normal. Thought content normal.          Assessment & Plan:

## 2015-09-08 NOTE — Progress Notes (Signed)
Pre visit review using our clinic review tool, if applicable. No additional management support is needed unless otherwise documented below in the visit note. 

## 2015-09-08 NOTE — Assessment & Plan Note (Signed)
Stable on meloxicam, continue same.  

## 2015-09-08 NOTE — Assessment & Plan Note (Signed)
Discussed diet/exercise/weight loss.

## 2015-09-08 NOTE — Assessment & Plan Note (Addendum)
Deteriorated. Will rx with amlodipine. Add ASA for CV prevention.

## 2015-09-16 ENCOUNTER — Telehealth: Payer: Self-pay | Admitting: Family

## 2015-09-16 MED ORDER — TADALAFIL 5 MG PO TABS: 5.0000 mg | ORAL_TABLET | Freq: Every day | ORAL | Status: DC

## 2015-09-16 NOTE — Telephone Encounter (Signed)
Notified pt and placed co-pay savings card at front desk.

## 2015-09-16 NOTE — Telephone Encounter (Signed)
rx sent, do we have a coupon to give him for daily cialis?

## 2015-09-16 NOTE — Telephone Encounter (Signed)
Pt said that his insurance will not cover the levitra. He said he is ok to try the once daily cialis you mentioned before

## 2015-09-23 ENCOUNTER — Ambulatory Visit (INDEPENDENT_AMBULATORY_CARE_PROVIDER_SITE_OTHER): Payer: Commercial Managed Care - PPO | Admitting: Family

## 2015-09-23 VITALS — BP 116/68 | HR 85

## 2015-09-23 DIAGNOSIS — I10 Essential (primary) hypertension: Secondary | ICD-10-CM | POA: Diagnosis not present

## 2015-09-23 NOTE — Progress Notes (Signed)
BP improved, continue current meds 

## 2015-09-23 NOTE — Progress Notes (Signed)
Pre visit review using our clinic review tool, if applicable. No additional management support is needed unless otherwise documented below in the visit note.  Patient presents today for a blood pressure check per office visit note 09/08/15. Readings were as follow: B/P 116/68  P 85.  Per O[place]ullivan: Continue current medication regimen. Informed patient of the provider's recommendation. He understood and did not have any questions or concerns prior to leaving the office.

## 2015-10-15 ENCOUNTER — Other Ambulatory Visit: Payer: Self-pay | Admitting: Physician Assistant

## 2015-11-13 ENCOUNTER — Telehealth: Payer: Self-pay | Admitting: Family

## 2015-11-13 MED ORDER — SILDENAFIL CITRATE 50 MG PO TABS: 50.0000 mg | ORAL_TABLET | Freq: Every day | ORAL | Status: DC | PRN

## 2015-11-13 NOTE — Telephone Encounter (Signed)
rx sent

## 2015-11-13 NOTE — Telephone Encounter (Signed)
Caller name:Blaker, Harrell Gave E Relation to [phone]  Call back number:[phone] [id number] , [place] [phone] (Phone) [phone] (Fax)         Reason for call:  Patient states tadalafil (CIALIS) 5 MG tablet is not working requesting viagra

## 2015-11-16 NOTE — Telephone Encounter (Signed)
Notified pt. 

## 2015-12-21 ENCOUNTER — Encounter: Payer: Self-pay | Admitting: Family

## 2015-12-21 ENCOUNTER — Ambulatory Visit (INDEPENDENT_AMBULATORY_CARE_PROVIDER_SITE_OTHER): Payer: Commercial Managed Care - PPO | Admitting: Family

## 2015-12-21 VITALS — BP 140/86 | HR 71 | Temp 98.3°F | Resp 18 | Ht 73.25 in | Wt 249.0 lb

## 2015-12-21 DIAGNOSIS — M79671 Pain in right foot: Secondary | ICD-10-CM

## 2015-12-21 DIAGNOSIS — G8929 Other chronic pain: Secondary | ICD-10-CM

## 2015-12-21 DIAGNOSIS — N138 Other obstructive and reflux uropathy: Secondary | ICD-10-CM

## 2015-12-21 DIAGNOSIS — N401 Enlarged prostate with lower urinary tract symptoms: Secondary | ICD-10-CM | POA: Diagnosis not present

## 2015-12-21 DIAGNOSIS — N529 Male erectile dysfunction, unspecified: Secondary | ICD-10-CM | POA: Insufficient documentation

## 2015-12-21 DIAGNOSIS — I10 Essential (primary) hypertension: Secondary | ICD-10-CM | POA: Diagnosis not present

## 2015-12-21 DIAGNOSIS — E7800 Pure hypercholesterolemia, unspecified: Secondary | ICD-10-CM

## 2015-12-21 DIAGNOSIS — N400 Enlarged prostate without lower urinary tract symptoms: Secondary | ICD-10-CM

## 2015-12-21 LAB — PSA: PSA: 1.9 ng/mL (ref 0.10–4.00)

## 2015-12-21 MED ORDER — SILDENAFIL CITRATE 100 MG PO TABS: 100.0000 mg | ORAL_TABLET | Freq: Every day | ORAL | Status: DC | PRN

## 2015-12-21 MED ORDER — AMLODIPINE BESYLATE 5 MG PO TABS: 5.0000 mg | ORAL_TABLET | Freq: Every day | ORAL | Status: DC

## 2015-12-21 MED ORDER — MELOXICAM 7.5 MG PO TABS: ORAL_TABLET | ORAL | Status: DC

## 2015-12-21 NOTE — Assessment & Plan Note (Signed)
BP up a bit.  Restart amlodipine. He does not feel bothered enough to d/c amlodipine due to swelling.

## 2015-12-21 NOTE — Assessment & Plan Note (Addendum)
Stable on flomax continue same. Requests follow up PSA.

## 2015-12-21 NOTE — Progress Notes (Signed)
Subjective:    Patient ID: Mario Carpenter, male    DOB: 1956/07/22, 60 y.o.   MRN: [id number]  HPI  Mario Carpenter is a 60 yr old male who presents today for follow up.  1) HTN- currently maintained on amlodipine 5mg .  Reports some swelling in ankles. Also reports some fatigue. He has been out of amlodipine in 3-4 days.  BP Readings from Last 3 Encounters:  12/21/15 140/86  09/23/15 116/68  09/08/15 160/90   2) Hyperlipidemia- Not on statin.  Lab Results  Component Value Date   CHOL 181 09/22/2014   HDL 49.60 09/22/2014   LDLCALC 120* 09/22/2014   TRIG 55.0 09/22/2014   CHOLHDL 4 09/22/2014   3) BPH- maintained on flomax.  Notes nocturia is improved the most when he takes flomax in the AM.  Lab Results  Component Value Date   PSA 1.96 09/22/2014   PSA 1.45 09/20/2013   PSA 1.64 09/12/2012   4) ED- pt is currently using viagra 50mg . Reports that he has better response to two tabs of the viagra 50mg .  Would like to increase viagra to 100mg .   5) Joint pain- currently using meloxicam 7.5 mg. Reports some chronic right foot pain "for years."  Pain is on the dorsal aspect of the right foot.  meloxicam helps.    Review of Systems See HPI  Past Medical History  Diagnosis Date  . Hypertension   . Psoriasis   . Foot pain, right   . History of chicken pox   . Alcoholism [facility])     Resolved     Social History   Social History  . Marital Status: Married    Spouse Name: N/A  . Number of Children: N/A  . Years of Education: N/A   Occupational History  . Not on file.   Social History Main Topics  . Smoking status: Former Research scientist (life sciences)  . Smokeless tobacco: Never Used  . Alcohol Use: No  . Drug Use: Not on file  . Sexual Activity: Not on file   Other Topics Concern  . Not on file   Social History Narrative    Past Surgical History  Procedure Laterality Date  . Appendectomy    . Hernia repair    . Knee surgery    . Wisdom tooth extraction      Family  History  Problem Relation Age of Onset  . Heart disease Mother 70    Deceased  . Diabetes Son     type 1  . Heart defect Mother   . Diabetes Father 56    Deceased  . Healthy Brother     x1  . Healthy Sister     x2    No Known Allergies  Current Outpatient Prescriptions on File Prior to Visit  Medication Sig Dispense Refill  . amLODipine (NORVASC) 5 MG tablet Take 1 tablet (5 mg total) by mouth daily. 30 tablet 2  . aspirin EC 81 MG tablet Take 1 tablet (81 mg total) by mouth daily.    . betamethasone dipropionate (DIPROLENE) 0.05 % ointment Apply topically 2 (two) times daily. (Patient taking differently: Apply topically 2 (two) times daily as needed. ) 60 g 6  . calcipotriene (DOVONOX) 0.005 % cream Apply topically 2 (two) times daily. (Patient taking differently: Apply topically 2 (two) times daily as needed. ) 60 g 6  . meloxicam (MOBIC) 7.5 MG tablet TAKE 1 TABLET BY MOUTH ONCE TO TWICE A DAY AS NEEDED 60 tablet  2  . Multiple Vitamin (MULTIVITAMIN) tablet Take 1 tablet by mouth daily. Reported on 09/08/2015    . Nutritional Supplements (COMPLETE PROTEIN/VITAMIN SHAKE PO) Take by mouth as directed. Reported on 09/08/2015    . Probiotic Product (PROBIOTIC DAILY) CAPS Take by mouth daily.    . sildenafil (VIAGRA) 50 MG tablet Take 1 tablet (50 mg total) by mouth daily as needed for erectile dysfunction. 10 tablet 2  . tamsulosin (FLOMAX) 0.4 MG CAPS capsule TAKE ONE CAPSULE BY MOUTH ONE TIME DAILY 30 capsule 5   No current facility-administered medications on file prior to visit.    BP 140/86 mmHg  Pulse 71  Temp(Src) 98.3 F (36.8 C) (Oral)  Resp 18  Ht 6' 1.25" (1.861 m)  Wt 249 lb (112.946 kg)  BMI 32.61 kg/m2  SpO2 97%       Objective:   Physical Exam  Constitutional: He is oriented to person, place, and time. He appears well-developed and well-nourished. No distress.  HENT:  Head: Normocephalic and atraumatic.  Cardiovascular: Normal rate and regular rhythm.   No  murmur heard. Pulmonary/Chest: Effort normal and breath sounds normal. No respiratory distress. He has no wheezes. He has no rales.  Musculoskeletal:  1+ bilateral LE edema.   Neurological: He is alert and oriented to person, place, and time.  Skin: Skin is warm and dry.  Psychiatric: He has a normal mood and affect. His behavior is normal. Thought content normal.          Assessment & Plan:

## 2015-12-21 NOTE — Patient Instructions (Addendum)
Please complete lab work prior to leaving. Increase viagra from 50mg  to 100mg . Change meloxicam from twice daily to once daily. Add tylenol as needed for pain. Restart amlodipine.

## 2015-12-21 NOTE — Assessment & Plan Note (Signed)
He notes that tylenol does help his pain. Advised pt to d/c second dose of meloxicam 7.5mg .  Instead use tylenol prn breakthrough pain but not to exceed 3000mg /24 hrs.

## 2015-12-21 NOTE — Assessment & Plan Note (Signed)
Last lipid panel looked good.  Continue low cholesterol diet.

## 2015-12-21 NOTE — Assessment & Plan Note (Signed)
Increase viagra from 50mg  to 100mg .

## 2015-12-28 ENCOUNTER — Telehealth: Payer: Self-pay | Admitting: Family

## 2015-12-28 NOTE — Telephone Encounter (Signed)
Can be reached: [phone]   Reason for call: Pt calling in for lab results from last week.

## 2015-12-28 NOTE — Telephone Encounter (Signed)
Notified pt per 12/21/15 lab letter.

## 2016-06-03 ENCOUNTER — Other Ambulatory Visit: Payer: Self-pay | Admitting: Family

## 2016-06-07 ENCOUNTER — Other Ambulatory Visit: Payer: Self-pay | Admitting: Family

## 2016-06-20 ENCOUNTER — Ambulatory Visit: Payer: Commercial Managed Care - PPO | Admitting: Family

## 2016-06-21 ENCOUNTER — Ambulatory Visit: Payer: Commercial Managed Care - PPO | Admitting: Family

## 2016-06-24 ENCOUNTER — Ambulatory Visit (INDEPENDENT_AMBULATORY_CARE_PROVIDER_SITE_OTHER): Payer: Commercial Managed Care - PPO | Admitting: Family

## 2016-06-24 ENCOUNTER — Encounter: Payer: Self-pay | Admitting: Family

## 2016-06-24 VITALS — BP 140/80 | HR 70 | Temp 97.9°F | Ht 73.0 in | Wt 248.0 lb

## 2016-06-24 DIAGNOSIS — N138 Other obstructive and reflux uropathy: Secondary | ICD-10-CM | POA: Diagnosis not present

## 2016-06-24 DIAGNOSIS — I10 Essential (primary) hypertension: Secondary | ICD-10-CM | POA: Diagnosis not present

## 2016-06-24 DIAGNOSIS — M1612 Unilateral primary osteoarthritis, left hip: Secondary | ICD-10-CM | POA: Diagnosis not present

## 2016-06-24 DIAGNOSIS — N401 Enlarged prostate with lower urinary tract symptoms: Secondary | ICD-10-CM | POA: Diagnosis not present

## 2016-06-24 LAB — BASIC METABOLIC PANEL
BUN: 18 mg/dL (ref 6–23)
CO2: 30 mEq/L (ref 19–32)
Calcium: 9.9 mg/dL (ref 8.4–10.5)
Chloride: 104 mEq/L (ref 96–112)
Creatinine, Ser: 1.02 mg/dL (ref 0.40–1.50)
GFR: 79.06 mL/min (ref >60.00)
Glucose, Bld: 92 mg/dL (ref 70–99)
Potassium: 4.5 mEq/L (ref 3.5–5.1)
Sodium: 140 mEq/L (ref 135–145)

## 2016-06-24 NOTE — Assessment & Plan Note (Addendum)
BP acceptable, continue current dose of amlodipine. Obtain follow up bmet.

## 2016-06-24 NOTE — Progress Notes (Signed)
Subjective:    Patient ID: Mario Carpenter, male    DOB: 10/03/1955, 60 y.o.   MRN: [id number]  HPI  [name] is a 60 yr old male who presents today for follow up.  DJD left hip- reports that he is scheduled for a Left THA in 2/18.  He continues meloxicam once daily in the AM. Uses tylenol as needed.    HTN- He continues amlodipine but reports that swelling is mild.  BP Readings from Last 3 Encounters:  06/24/16 140/80  12/21/15 140/86  09/23/15 116/68   BPH- continues flomax.  Notes that he is having nocturia 3-4 times a night. Reports intermittent urgency/frequency.  Generally feels like he can empty his bladder but stream is weak.    Review of Systems  Respiratory: Negative for shortness of breath.   Cardiovascular: Negative for chest pain.   Past Medical History:  Diagnosis Date  . Alcoholism ([place])    Resolved  . Foot pain, right   . History of chicken pox   . Hypertension   . Psoriasis      Social History   Social History  . Marital status: Married    Spouse name: N/A  . Number of children: N/A  . Years of education: N/A   Occupational History  . Not on file.   Social History Main Topics  . Smoking status: Former Research scientist (life sciences)  . Smokeless tobacco: Never Used  . Alcohol use No  . Drug use: Unknown  . Sexual activity: Not on file   Other Topics Concern  . Not on file   Social History Narrative  . No narrative on file    Past Surgical History:  Procedure Laterality Date  . APPENDECTOMY    . HERNIA REPAIR    . KNEE SURGERY    . WISDOM TOOTH EXTRACTION      Family History  Problem Relation Age of Onset  . Heart disease Mother 102    Deceased  . Diabetes Son     type 1  . Heart defect Mother   . Diabetes Father 19    Deceased  . Healthy Brother     x1  . Healthy Sister     x2    No Known Allergies  Current Outpatient Prescriptions on File Prior to Visit  Medication Sig Dispense Refill  . amLODipine (NORVASC) 5 MG tablet Take 1  tablet (5 mg total) by mouth daily. 30 tablet 5  . aspirin EC 81 MG tablet Take 1 tablet (81 mg total) by mouth daily.    . betamethasone dipropionate (DIPROLENE) 0.05 % ointment Apply topically 2 (two) times daily. (Patient taking differently: Apply topically 2 (two) times daily as needed. ) 60 g 6  . calcipotriene (DOVONOX) 0.005 % cream Apply topically 2 (two) times daily. (Patient taking differently: Apply topically 2 (two) times daily as needed. ) 60 g 6  . meloxicam (MOBIC) 7.5 MG tablet TAKE 1 TABLET BY MOUTH ONCE DAILY AS NEEDED 30 tablet 5  . Multiple Vitamin (MULTIVITAMIN) tablet Take 1 tablet by mouth daily. Reported on 09/08/2015    . Nutritional Supplements (COMPLETE PROTEIN/VITAMIN SHAKE PO) Take by mouth as directed. Reported on 09/08/2015    . Probiotic Product (PROBIOTIC DAILY) CAPS Take by mouth daily.    . sildenafil (VIAGRA) 100 MG tablet Take 1 tablet (100 mg total) by mouth daily as needed for erectile dysfunction. 5 tablet 11  . tamsulosin (FLOMAX) 0.4 MG CAPS capsule TAKE ONE CAPSULE BY MOUTH  EVERY DAY 30 capsule 5   No current facility-administered medications on file prior to visit.     BP 140/80 (BP Location: Left Arm, Patient Position: Sitting, Cuff Size: Normal)   Pulse 70   Temp 97.9 F (36.6 C) (Oral)   Ht 6\' 1"  (1.854 m)   Wt 248 lb (112.5 kg)   SpO2 98%   BMI 32.72 kg/m       Objective:   Physical Exam  Constitutional: He is oriented to person, place, and time. He appears well-developed and well-nourished. No distress.  HENT:  Head: Normocephalic and atraumatic.  Cardiovascular: Normal rate and regular rhythm.   No murmur heard. Pulmonary/Chest: Effort normal and breath sounds normal. No respiratory distress. He has no wheezes. He has no rales.  Musculoskeletal: He exhibits no edema.  Neurological: He is alert and oriented to person, place, and time.  Skin: Skin is warm and dry.  Psychiatric: He has a normal mood and affect. His behavior is normal.  Thought content normal.          Assessment & Plan:

## 2016-06-24 NOTE — Patient Instructions (Addendum)
You will be contacted about your referral to urology.  Please schedule a surgical clearance within 30 days from your scheduled surgery.  Complete lab work prior to leaving.

## 2016-06-24 NOTE — Progress Notes (Signed)
Pre visit review using our clinic review tool, if applicable. No additional management support is needed unless otherwise documented below in the visit note. 

## 2016-06-24 NOTE — Assessment & Plan Note (Signed)
Will have THA with Dr. Maureen Ralphs in February. Advised pt schedule a surgical clearance within 30 days of his anticipated surgery. He is agreeable.

## 2016-06-24 NOTE — Assessment & Plan Note (Signed)
Uncontrolled symptoms on flomax. Will refer to urology for consultation.

## 2016-06-28 ENCOUNTER — Other Ambulatory Visit: Payer: Self-pay | Admitting: Family

## 2016-07-07 ENCOUNTER — Telehealth: Payer: Self-pay | Admitting: Family

## 2016-07-07 NOTE — Telephone Encounter (Signed)
Patient called letting us know that he has switched pharmacies. He is now with [facility]. He would like 90 day refills from now on.   Patient's ID #: [id number] [facility] Phone: [phone] [facility] Fax: [phone]  Patient is out of meloxicam (MOBIC) 7.5 MG tablet  And [id number] [place], [address] one [facility] time because he needs it before [facility] can get it to him. Please advise.    Patient phone: [phone]

## 2016-07-08 MED ORDER — MELOXICAM 7.5 MG PO TABS: 7.5000 mg | ORAL_TABLET | Freq: Every day | ORAL | 1 refills | Status: DC | PRN

## 2016-07-08 MED ORDER — AMLODIPINE BESYLATE 5 MG PO TABS: 5.0000 mg | ORAL_TABLET | Freq: Every day | ORAL | 1 refills | Status: DC

## 2016-07-08 MED ORDER — TAMSULOSIN HCL 0.4 MG PO CAPS: 0.4000 mg | ORAL_CAPSULE | Freq: Every day | ORAL | 1 refills | Status: DC

## 2016-07-08 NOTE — Addendum Note (Signed)
Addended by: Magdalene Molly A on: 07/08/2016 02:01 PM   Modules accepted: Orders

## 2016-07-08 NOTE — Telephone Encounter (Signed)
Ok to refill meloxicam 

## 2016-07-08 NOTE — Telephone Encounter (Signed)
Meloxicam has been sent per PCP

## 2016-07-08 NOTE — Telephone Encounter (Signed)
Spoke with pt. [facility]. Requests that amlodipine, tamsulosin and meloxicam be sent to mail order. I sent amodipine and tamsulosin. Please advise meloxicam?

## 2016-07-08 NOTE — Addendum Note (Signed)
Addended by: Kelle Darting A on: 07/08/2016 08:29 AM   Modules accepted: Orders

## 2016-07-22 ENCOUNTER — Encounter: Payer: Self-pay | Admitting: Family

## 2016-07-22 ENCOUNTER — Ambulatory Visit (INDEPENDENT_AMBULATORY_CARE_PROVIDER_SITE_OTHER): Payer: Commercial Managed Care - PPO | Admitting: Family

## 2016-07-22 ENCOUNTER — Ambulatory Visit (HOSPITAL_BASED_OUTPATIENT_CLINIC_OR_DEPARTMENT_OTHER)
Admission: RE | Admit: 2016-07-22 | Discharge: 2016-07-22 | Disposition: A | Discharge status: Home / Self Care | Payer: Commercial Managed Care - PPO | Source: Ambulatory Visit | Attending: Family | Admitting: Family

## 2016-07-22 VITALS — BP 135/84 | HR 74 | Temp 98.0°F | Resp 18 | Ht 73.0 in | Wt 246.0 lb

## 2016-07-22 DIAGNOSIS — Z01818 Encounter for other preprocedural examination: Secondary | ICD-10-CM | POA: Insufficient documentation

## 2016-07-22 DIAGNOSIS — M1612 Unilateral primary osteoarthritis, left hip: Secondary | ICD-10-CM

## 2016-07-22 DIAGNOSIS — Z01810 Encounter for preprocedural cardiovascular examination: Secondary | ICD-10-CM

## 2016-07-22 DIAGNOSIS — Z23 Encounter for immunization: Secondary | ICD-10-CM

## 2016-07-22 LAB — BASIC METABOLIC PANEL
BUN: 14 mg/dL (ref 6–23)
CALCIUM: 9.7 mg/dL (ref 8.4–10.5)
CO2: 29 mEq/L (ref 19–32)
Chloride: 103 mEq/L (ref 96–112)
Creatinine, Ser: 1.01 mg/dL (ref 0.40–1.50)
GFR: 79.94 mL/min (ref >60.00)
Glucose, Bld: 78 mg/dL (ref 70–99)
Potassium: 4.6 mEq/L (ref 3.5–5.1)
Sodium: 138 mEq/L (ref 135–145)

## 2016-07-22 LAB — HEPATIC FUNCTION PANEL
ALT: 20 U/L (ref 0–53)
AST: 19 U/L (ref 0–37)
Albumin: 4.5 g/dL (ref 3.5–5.2)
Alkaline Phosphatase: 55 U/L (ref 39–117)
BILIRUBIN DIRECT: 0.1 mg/dL (ref 0.0–0.3)
Total Bilirubin: 0.9 mg/dL (ref 0.2–1.2)
Total Protein: 7 g/dL (ref 6.0–8.3)

## 2016-07-22 LAB — PROTIME-INR
INR: 1 ratio (ref 0.8–1.0)
PROTHROMBIN TIME: 10.6 s (ref 9.6–13.1)

## 2016-07-22 LAB — CBC WITH DIFFERENTIAL/PLATELET
BASOS ABS: 0.1 10*3/uL (ref 0.0–0.1)
Basophils Relative: 0.9 % (ref 0.0–3.0)
EOS ABS: 0.2 10*3/uL (ref 0.0–0.7)
Eosinophils Relative: 2.8 % (ref 0.0–5.0)
HEMATOCRIT: 42.8 % (ref 39.0–52.0)
Hemoglobin: 14.5 g/dL (ref 13.0–17.0)
LYMPHS PCT: 27.7 % (ref 12.0–46.0)
Lymphs Abs: 1.9 10*3/uL (ref 0.7–4.0)
MCHC: 33.8 g/dL (ref 30.0–36.0)
MCV: 86.8 fl (ref 78.0–100.0)
MONO ABS: 0.8 10*3/uL (ref 0.1–1.0)
Monocytes Relative: 11.4 % (ref 3.0–12.0)
NEUTROS ABS: 3.9 10*3/uL (ref 1.4–7.7)
NEUTROS PCT: 57.2 % (ref 43.0–77.0)
PLATELETS: 284 10*3/uL (ref 150.0–400.0)
RBC: 4.94 Mil/uL (ref 4.22–5.81)
RDW: 12.8 % (ref 11.5–15.5)
WBC: 6.9 10*3/uL (ref 4.0–10.5)

## 2016-07-22 LAB — APTT: APTT: 30.9 s (ref 23.4–32.7)

## 2016-07-22 NOTE — Addendum Note (Signed)
Addended by: Kelle Darting A on: 07/22/2016 12:02 PM   Modules accepted: Orders

## 2016-07-22 NOTE — Progress Notes (Signed)
Subjective:    Patient ID: Mario Carpenter, male    DOB: 24-May-1956, 60 y.o.   MRN: [id number]  HPI  Mario Carpenter is a 60 yr old male who presents today for pre-operative evaluation. He is scheduled for a left total hip arthroplasty with Dr. Wynelle Link on 08/22/16.    Review of Systems  Constitutional: Negative for unexpected weight change.  HENT: Negative for hearing loss.   Eyes: Negative for visual disturbance.  Respiratory: Negative for cough and shortness of breath.   Cardiovascular: Negative for chest pain.  Gastrointestinal: Negative for diarrhea, nausea and vomiting.  Genitourinary: Negative for dysuria and hematuria.       + frequency of urination due to bph- has upcoming consult with urology  Musculoskeletal: Positive for arthralgias. Negative for myalgias.  Skin: Negative for rash.  Neurological: Negative for headaches.  Hematological: Negative for adenopathy.  Psychiatric/Behavioral:       Denies depression/anxiety   See HPI  Past Medical History:  Diagnosis Date  . Alcoholism ([place])    Resolved  . Foot pain, right   . History of chicken pox   . Hypertension   . Psoriasis      Social History   Social History  . Marital status: Married    Spouse name: N/A  . Number of children: N/A  . Years of education: N/A   Occupational History  . Not on file.   Social History Main Topics  . Smoking status: Former Research scientist (life sciences)  . Smokeless tobacco: Never Used  . Alcohol use No  . Drug use: Unknown  . Sexual activity: Not on file   Other Topics Concern  . Not on file   Social History Narrative  . No narrative on file    Past Surgical History:  Procedure Laterality Date  . APPENDECTOMY    . HERNIA REPAIR    . KNEE SURGERY    . WISDOM TOOTH EXTRACTION      Family History  Problem Relation Age of Onset  . Heart disease Mother 49    Deceased  . Heart defect Mother   . Diabetes Son     type 1  . Diabetes Father 47    Deceased  . Healthy Brother    x1  . Healthy Sister     x2    No Known Allergies  Current Outpatient Prescriptions on File Prior to Visit  Medication Sig Dispense Refill  . amLODipine (NORVASC) 5 MG tablet Take 1 tablet (5 mg total) by mouth daily. 90 tablet 1  . aspirin EC 81 MG tablet Take 1 tablet (81 mg total) by mouth daily.    . betamethasone dipropionate (DIPROLENE) 0.05 % ointment Apply topically 2 (two) times daily. (Patient taking differently: Apply topically 2 (two) times daily as needed. ) 60 g 6  . calcipotriene (DOVONOX) 0.005 % cream Apply topically 2 (two) times daily. (Patient taking differently: Apply topically 2 (two) times daily as needed. ) 60 g 6  . meloxicam (MOBIC) 7.5 MG tablet Take 1 tablet (7.5 mg total) by mouth daily as needed. 90 tablet 1  . Multiple Vitamin (MULTIVITAMIN) tablet Take 1 tablet by mouth daily. Reported on 09/08/2015    . Nutritional Supplements (COMPLETE PROTEIN/VITAMIN SHAKE PO) Take by mouth as directed. Reported on 09/08/2015    . sildenafil (VIAGRA) 100 MG tablet Take 1 tablet (100 mg total) by mouth daily as needed for erectile dysfunction. 5 tablet 11  . tamsulosin (FLOMAX) 0.4 MG CAPS capsule Take 1  capsule (0.4 mg total) by mouth daily. 90 capsule 1   No current facility-administered medications on file prior to visit.     BP 135/84 (BP Location: Right Arm, Patient Position: Sitting, Cuff Size: Large)   Pulse 74   Temp 98 F (36.7 C) (Oral)   Resp 18   Ht 6\' 1"  (1.854 m)   Wt 246 lb (111.6 kg)   SpO2 98% Comment: RA  BMI 32.46 kg/m       Objective:   Physical Exam  Physical Exam  Constitutional: He is oriented to person, place, and time. He appears well-developed and well-nourished. No distress.  HENT:  Head: Normocephalic and atraumatic.  Right Ear: Tympanic membrane and ear canal normal.  Left Ear: Tympanic membrane and ear canal normal.  Mouth/Throat: Oropharynx is clear and moist.  Eyes: Pupils are equal, round, and reactive to light. No scleral  icterus.  Neck: Normal range of motion. No thyromegaly present.  Cardiovascular: Normal rate and regular rhythm.   No murmur heard. Pulmonary/Chest: Effort normal and breath sounds normal. No respiratory distress. He has no wheezes. He has no rales. He exhibits no tenderness.  Abdominal: Soft. Bowel sounds are normal. He exhibits no distension and no mass. There is no tenderness. There is no rebound and no guarding.  Musculoskeletal: He exhibits no edema.  Lymphadenopathy:    He has no cervical adenopathy.  Neurological: He is alert and oriented to person, place, and time. He has normal patellar reflexes. He exhibits normal muscle tone. Coordination normal.  Skin: Skin is warm and dry.  Psychiatric: He has a normal mood and affect. His behavior is normal. Judgment and thought content normal.           Assessment & Plan:   Degenerative joint disease of left hip- for THA scheduled on 08/12/16.  Will obtain baseline lab work and chest x-ray.  EKG tracing is personally reviewed.  EKG notes NSR.  No acute changes. Surgical clearance pending review of these tests.  He knows to hold aspirin/multivitamin and meloxicam x 5 days pre-op. Flu shot today.       Assessment & Plan:

## 2016-07-22 NOTE — Patient Instructions (Addendum)
Please complete lab work prior to leaving. Complete chest x-ray prior to leaving.  

## 2016-07-22 NOTE — Progress Notes (Signed)
Pre visit review using our clinic review tool, if applicable. No additional management support is needed unless otherwise documented below in the visit note. 

## 2016-07-23 ENCOUNTER — Telehealth: Payer: Self-pay | Admitting: Family

## 2016-07-23 DIAGNOSIS — S22000A Wedge compression fracture of unspecified thoracic vertebra, initial encounter for closed fracture: Secondary | ICD-10-CM

## 2016-07-23 NOTE — Telephone Encounter (Signed)
Please let pt know that I reviewed his CXR- it notes a mild compression fracture in his lower back. Is he having low back pain?  I think it would be a good idea to have him complete a bone density to look for osteoporosis.

## 2016-07-25 ENCOUNTER — Telehealth: Payer: Self-pay | Admitting: Family

## 2016-07-25 NOTE — Telephone Encounter (Signed)
Notified pt and he states he wants to wait until after the 1st of the year. Order signed.

## 2016-07-25 NOTE — Telephone Encounter (Signed)
Message sent to lab to check on status.  °

## 2016-07-25 NOTE — Telephone Encounter (Signed)
Reviewed lab work for his pre-op clearance. It looks like his Urine studies did not get sent. Can you please check on this?

## 2016-07-25 NOTE — Telephone Encounter (Signed)
Left message for pt to return my call.

## 2016-07-27 ENCOUNTER — Telehealth: Payer: Self-pay | Admitting: Family

## 2016-07-27 ENCOUNTER — Ambulatory Visit (INDEPENDENT_AMBULATORY_CARE_PROVIDER_SITE_OTHER): Payer: Commercial Managed Care - PPO | Admitting: Medical

## 2016-07-27 ENCOUNTER — Encounter: Payer: Self-pay | Admitting: Medical

## 2016-07-27 VITALS — BP 140/80 | HR 79 | Temp 98.2°F | Ht 73.0 in | Wt 249.0 lb

## 2016-07-27 DIAGNOSIS — H6690 Otitis media, unspecified, unspecified ear: Secondary | ICD-10-CM

## 2016-07-27 DIAGNOSIS — J029 Acute pharyngitis, unspecified: Secondary | ICD-10-CM

## 2016-07-27 LAB — POCT RAPID STREP A (OFFICE): RAPID STREP A SCREEN: NEGATIVE

## 2016-07-27 MED ORDER — AMOXICILLIN-POT CLAVULANATE 875-125 MG PO TABS: 1.0000 | ORAL_TABLET | Freq: Two times a day (BID) | ORAL | 0 refills | Status: DC

## 2016-07-27 NOTE — Progress Notes (Signed)
Subjective:    Patient ID: Mario Carpenter, male    DOB: 14-Sep-1955, 60 y.o.   MRN: [id number]  HPI  Pt in for feeling sick for about one week. He states he has mostly left ear pain and st. Both low level pain. Ear pain was first. No nasal congestion. No fever, no chills, no sweats, and body aches.   No sinus pain.  Does have history of occasional sinus infections.  Pt has tender left side neck lymph node.   Review of Systems  Constitutional: Negative for chills, fatigue and fever.  HENT: Positive for ear pain and sore throat. Negative for congestion, postnasal drip, rhinorrhea, sinus pain and sinus pressure.   Respiratory: Negative for cough.   Gastrointestinal: Negative for abdominal pain.  Musculoskeletal: Negative for back pain.  Neurological: Negative for dizziness, weakness, light-headedness, numbness and headaches.  Hematological: Positive for adenopathy. Does not bruise/bleed easily.    Past Medical History:  Diagnosis Date  . Alcoholism ([place])    Resolved  . Foot pain, right   . History of chicken pox   . Hypertension   . Psoriasis      Social History   Social History  . Marital status: Married    Spouse name: N/A  . Number of children: N/A  . Years of education: N/A   Occupational History  . Not on file.   Social History Main Topics  . Smoking status: Former Research scientist (life sciences)  . Smokeless tobacco: Never Used  . Alcohol use No  . Drug use: Unknown  . Sexual activity: Not on file   Other Topics Concern  . Not on file   Social History Narrative  . No narrative on file    Past Surgical History:  Procedure Laterality Date  . APPENDECTOMY    . HERNIA REPAIR    . KNEE SURGERY    . WISDOM TOOTH EXTRACTION      Family History  Problem Relation Age of Onset  . Heart disease Mother 79    Deceased  . Heart defect Mother   . Diabetes Son     type 1  . Diabetes Father 68    Deceased  . Healthy Brother     x1  . Healthy Sister     x2    No  Known Allergies  Current Outpatient Prescriptions on File Prior to Visit  Medication Sig Dispense Refill  . amLODipine (NORVASC) 5 MG tablet Take 1 tablet (5 mg total) by mouth daily. 90 tablet 1  . aspirin EC 81 MG tablet Take 1 tablet (81 mg total) by mouth daily.    . betamethasone dipropionate (DIPROLENE) 0.05 % ointment Apply topically 2 (two) times daily. (Patient taking differently: Apply topically 2 (two) times daily as needed. ) 60 g 6  . calcipotriene (DOVONOX) 0.005 % cream Apply topically 2 (two) times daily. (Patient taking differently: Apply topically 2 (two) times daily as needed. ) 60 g 6  . meloxicam (MOBIC) 7.5 MG tablet Take 1 tablet (7.5 mg total) by mouth daily as needed. 90 tablet 1  . Multiple Vitamin (MULTIVITAMIN) tablet Take 1 tablet by mouth daily. Reported on 09/08/2015    . Nutritional Supplements (COMPLETE PROTEIN/VITAMIN SHAKE PO) Take by mouth as directed. Reported on 09/08/2015    . sildenafil (VIAGRA) 100 MG tablet Take 1 tablet (100 mg total) by mouth daily as needed for erectile dysfunction. 5 tablet 11  . tamsulosin (FLOMAX) 0.4 MG CAPS capsule Take 1 capsule (0.4 mg total)  by mouth daily. 90 capsule 1   No current facility-administered medications on file prior to visit.     BP 140/80 (BP Location: Right Arm, Patient Position: Sitting, Cuff Size: Normal)   Pulse 79   Temp 98.2 F (36.8 C) (Oral)   Ht 6\' 1"  (1.854 m)   Wt 249 lb (112.9 kg)   SpO2 97%   BMI 32.85 kg/m       Objective:   Physical Exam  General  Mental Status - Alert. General Appearance - Well groomed. Not in acute distress.  Skin Rashes- No Rashes.  HEENT Head- Normal. Ear Auditory Canal - Left- Normal. Right - Normal.Tympanic Membrane- Left- moderate bright red central portion. Right- moderate bright red central portion. Eye Sclera/Conjunctiva- Left- Normal. Right- Normal. Nose & Sinuses Nasal Mucosa- Left-  Boggy and Congested. Right-  Boggy and  Congested.Bilatera no  maxillary and no  frontal sinus pressure. Mouth & Throat Lips: Upper Lip- Normal: no dryness, cracking, pallor, cyanosis, or vesicular eruption. Lower Lip-Normal: no dryness, cracking, pallor, cyanosis or vesicular eruption. Buccal Mucosa- Bilateral- No Aphthous ulcers. Oropharynx- No Discharge or Erythema. Tonsils: Characteristics- Bilateral- moderate  Erythema . Size/Enlargement- Bilateral- 1+ enlargement. Discharge- bilateral-None.  Neck Neck- Supple. No Masses. Moderate large and tender left submandibular node.   Chest and Lung Exam Auscultation: Breath Sounds:-Clear even and unlabored.  Cardiovascular Auscultation:Rythm- Regular, rate and rhythm. Murmurs & Other Heart Sounds:Ausculatation of the heart reveal- No Murmurs.  Lymphatic Head & Neck General Head & Neck Lymphatics: Bilateral: Description- No Localized lymphadenopathy.       Assessment & Plan:  Both your throat and ears are suspicious for infection despite rapid strep test negative. Also your enlarged tender neck lymph node is indicator of infection. We are approaching 4 day holiday and will go ahead and rx augmentin antibiotic.  Also will rx flonase to decrease pressure in your ear tubes.  Follow up in 7 days or as needed  Doc Mandala, Percell Miller, [employer]

## 2016-07-27 NOTE — Telephone Encounter (Signed)
Caller name: Gerald Stabs Relationship to patient: self Can be reached: [phone] Pharmacy: [id number] - [place], [place]  Reason for call: Pt said that he was here last week and since flu shot he is having sore throat and earache. Pt is requesting abx be sent in for him.

## 2016-07-27 NOTE — Telephone Encounter (Signed)
Spoke with pt. Also has sore throat and ear ache. Will see PA, Saguier today at 2pm and collect urine at that time.

## 2016-07-27 NOTE — Telephone Encounter (Signed)
I left message for patient to call me back yesterday so he could come back for recollect on urine. I heard from him yet, I will call him back. KMP

## 2016-07-27 NOTE — Progress Notes (Signed)
Has pre op scheduled for 08/04/16- please place surgical orders in epic  thanks

## 2016-07-27 NOTE — Telephone Encounter (Signed)
Spoke with pt and scheduled appt with Mackie Pai at 2pm today. He will also complete urine testing for surgicall clearance after that visit.

## 2016-07-27 NOTE — Patient Instructions (Signed)
Both your throat and ears are suspicious for infection despite rapid strep test negative. Also your enlarged tender neck lymph node is indicator of infection. We are approaching 4 day holiday and will go ahead and rx augmentin antibiotic.  Also will rx flonase to decrease pressure in your ear tubes.  Follow up in 7 days or as needed

## 2016-07-27 NOTE — Progress Notes (Signed)
Pre visit review using our clinic review tool, if applicable. No additional management support is needed unless otherwise documented below in the visit note. 

## 2016-07-27 NOTE — Telephone Encounter (Signed)
Received call from lab on 07/26/16. Specimen did not get sent our for testing. They will contact pt to re-collect.

## 2016-07-28 LAB — URINALYSIS, ROUTINE W REFLEX MICROSCOPIC
Bilirubin Urine: NEGATIVE
GLUCOSE, UA: NEGATIVE
HGB URINE DIPSTICK: NEGATIVE
KETONES UR: NEGATIVE
Leukocytes, UA: NEGATIVE
NITRITE: NEGATIVE
PH: 7 (ref 5.0–8.0)
Protein, ur: NEGATIVE
Specific Gravity, Urine: 1.01 (ref 1.001–1.035)

## 2016-07-29 ENCOUNTER — Telehealth: Payer: Self-pay | Admitting: Medical

## 2016-07-29 LAB — URINE CULTURE: ORGANISM ID, BACTERIA: NO GROWTH

## 2016-07-29 LAB — CULTURE, GROUP A STREP: Organism ID, Bacteria: NORMAL

## 2016-07-29 NOTE — Telephone Encounter (Signed)
Rapid strep done and then send out culture done. Did not request send out culture. This was second send one done without request. Is this new protocol?

## 2016-08-01 NOTE — Progress Notes (Signed)
Surgery on 12/8.  Proep on 11/30.  Need orders in EPIC Thank You

## 2016-08-01 NOTE — Telephone Encounter (Signed)
Noted  

## 2016-08-02 ENCOUNTER — Ambulatory Visit: Payer: Self-pay | Admitting: Orthopedic Surgery

## 2016-08-04 ENCOUNTER — Encounter (HOSPITAL_COMMUNITY)
Admission: RE | Admit: 2016-08-04 | Discharge: 2016-08-04 | Disposition: A | Discharge status: Home / Self Care | Payer: Commercial Managed Care - PPO | Source: Ambulatory Visit | Attending: Orthopedic Surgery | Admitting: Orthopedic Surgery

## 2016-08-04 ENCOUNTER — Encounter (HOSPITAL_COMMUNITY): Payer: Self-pay

## 2016-08-04 DIAGNOSIS — M8788 Other osteonecrosis, other site: Secondary | ICD-10-CM | POA: Insufficient documentation

## 2016-08-04 DIAGNOSIS — Z01818 Encounter for other preprocedural examination: Secondary | ICD-10-CM | POA: Diagnosis not present

## 2016-08-04 DIAGNOSIS — M1612 Unilateral primary osteoarthritis, left hip: Secondary | ICD-10-CM | POA: Diagnosis not present

## 2016-08-04 HISTORY — DX: Frequency of micturition: R35.0

## 2016-08-04 HISTORY — DX: Unspecified osteoarthritis, unspecified site: M19.90

## 2016-08-04 HISTORY — DX: Nocturia: R35.1

## 2016-08-04 HISTORY — DX: Personal history of other diseases of the respiratory system: Z87.09

## 2016-08-04 LAB — COMPREHENSIVE METABOLIC PANEL
ALBUMIN: 4.3 g/dL (ref 3.5–5.0)
ALK PHOS: 71 U/L (ref 38–126)
ALT: 47 U/L (ref 17–63)
AST: 35 U/L (ref 15–41)
Anion gap: 4 — ABNORMAL LOW (ref 5–15)
BILIRUBIN TOTAL: 1 mg/dL (ref 0.3–1.2)
BUN: 15 mg/dL (ref 6–20)
CALCIUM: 9.2 mg/dL (ref 8.9–10.3)
CO2: 28 mmol/L (ref 22–32)
Chloride: 105 mmol/L (ref 101–111)
Creatinine, Ser: 1.06 mg/dL (ref 0.61–1.24)
GFR calc Af Amer: >60 mL/min (ref >60)
GFR calc non Af Amer: >60 mL/min (ref >60)
GLUCOSE: 85 mg/dL (ref 65–99)
Potassium: 4.6 mmol/L (ref 3.5–5.1)
Sodium: 137 mmol/L (ref 135–145)
TOTAL PROTEIN: 7.5 g/dL (ref 6.5–8.1)

## 2016-08-04 LAB — SURGICAL PCR SCREEN
MRSA, PCR: NEGATIVE
STAPHYLOCOCCUS AUREUS: NEGATIVE

## 2016-08-04 LAB — ABO/RH: ABO/RH(D): O POS

## 2016-08-04 NOTE — Progress Notes (Signed)
Your patient has screened at an elevated risk for Obstructive Sleep Apnea using the Stop-Bang Tool during a pre-surgical visit. Pt scored at high risk.

## 2016-08-04 NOTE — Patient Instructions (Signed)
ERVEN WINKLE  08/04/2016   Your procedure is scheduled on: Friday August 12, 2016  Report to [facility] Main  Entrance take [place]  elevators to 3rd floor to  [place] at 12:30 PM.  Call this number if you have problems the morning of surgery [phone]   Remember: ONLY 1 PERSON MAY GO WITH YOU TO SHORT STAY TO GET  READY MORNING OF [place].  Do not eat food After Midnight but may take clear liquids till 8:30 am day of surgery then nothing by mouth.      Take these medicines the morning of surgery with A SIP OF WATER: Amlodipine; Tamsulosin (Flomax)                                You may not have any metal on your body including hair pins and              piercings  Do not wear jewelry, lotions, powders or colognes, deodorant                      Men may shave face and neck.   Do not bring valuables to the hospital. [place].  Contacts, dentures or bridgework may not be worn into surgery.  Leave suitcase in the car. After surgery it may be brought to your room.                Please read over the following fact sheets you were given:MRSA INFORMATION SHEET; ;INCENTIVE SPIROMETER; BLOOD TRANSFUSION INFORMATION SHEET  _____________________________________________________________________             [facility] - Preparing for Surgery Before surgery, you can play an important role.  Because skin is not sterile, your skin needs to be as free of germs as possible.  You can reduce the number of germs on your skin by washing with CHG (chlorahexidine gluconate) soap before surgery.  CHG is an antiseptic cleaner which kills germs and bonds with the skin to continue killing germs even after washing. Please DO NOT use if you have an allergy to CHG or antibacterial soaps.  If your skin becomes reddened/irritated stop using the CHG and inform your nurse when you arrive at Short Stay. Do not shave  (including legs and underarms) for at least 48 hours prior to the first CHG shower.  You may shave your face/neck. Please follow these instructions carefully:  1.  Shower with CHG Soap the night before surgery and the  morning of Surgery.  2.  If you choose to wash your hair, wash your hair first as usual with your  normal  shampoo.  3.  After you shampoo, rinse your hair and body thoroughly to remove the  shampoo.                           4.  Use CHG as you would any other liquid soap.  You can apply chg directly  to the skin and wash                       Gently with a scrungie or clean washcloth.  5.  Apply the CHG Soap to your body ONLY FROM THE NECK DOWN.   Do not use on face/ open                           Wound or open sores. Avoid contact with eyes, ears mouth and genitals (private parts).                       Wash face,  Genitals (private parts) with your normal soap.             6.  Wash thoroughly, paying special attention to the area where your surgery  will be performed.  7.  Thoroughly rinse your body with warm water from the neck down.  8.  DO NOT shower/wash with your normal soap after using and rinsing off  the CHG Soap.                9.  Pat yourself dry with a clean towel.            10.  Wear clean pajamas.            11.  Place clean sheets on your bed the night of your first shower and do not  sleep with pets. Day of Surgery : Do not apply any lotions/deodorants the morning of surgery.  Please wear clean clothes to the hospital/surgery center.  FAILURE TO FOLLOW THESE INSTRUCTIONS MAY RESULT IN THE CANCELLATION OF YOUR SURGERY PATIENT SIGNATURE_________________________________  NURSE SIGNATURE__________________________________  ________________________________________________________________________   Adam Phenix  An incentive spirometer is a tool that can help keep your lungs clear and active. This tool measures how well you are filling your lungs with  each breath. Taking long deep breaths may help reverse or decrease the chance of developing breathing (pulmonary) problems (especially infection) following:  A long period of time when you are unable to move or be active. BEFORE THE PROCEDURE   If the spirometer includes an indicator to show your best effort, your nurse or respiratory therapist will set it to a desired goal.  If possible, sit up straight or lean slightly forward. Try not to slouch.  Hold the incentive spirometer in an upright position. INSTRUCTIONS FOR USE  1. Sit on the edge of your bed if possible, or sit up as far as you can in bed or on a chair. 2. Hold the incentive spirometer in an upright position. 3. Breathe out normally. 4. Place the mouthpiece in your mouth and seal your lips tightly around it. 5. Breathe in slowly and as deeply as possible, raising the piston or the ball toward the top of the column. 6. Hold your breath for 3-5 seconds or for as long as possible. Allow the piston or ball to fall to the bottom of the column. 7. Remove the mouthpiece from your mouth and breathe out normally. 8. Rest for a few seconds and repeat Steps 1 through 7 at least 10 times every 1-2 hours when you are awake. Take your time and take a few normal breaths between deep breaths. 9. The spirometer may include an indicator to show your best effort. Use the indicator as a goal to work toward during each repetition. 10. After each set of 10 deep breaths, practice coughing to be sure your lungs are clear. If you have an incision (the cut made at the time of surgery), support your incision when coughing by placing a pillow or  rolled up towels firmly against it. Once you are able to get out of bed, walk around indoors and cough well. You may stop using the incentive spirometer when instructed by your caregiver.  RISKS AND COMPLICATIONS  Take your time so you do not get dizzy or light-headed.  If you are in pain, you may need to take or  ask for pain medication before doing incentive spirometry. It is harder to take a deep breath if you are having pain. AFTER USE  Rest and breathe slowly and easily.  It can be helpful to keep track of a log of your progress. Your caregiver can provide you with a simple table to help with this. If you are using the spirometer at home, follow these instructions: [place] IF:   You are having difficultly using the spirometer.  You have trouble using the spirometer as often as instructed.  Your pain medication is not giving enough relief while using the spirometer.  You develop fever of 100.5 F (38.1 C) or higher. SEEK IMMEDIATE MEDICAL CARE IF:   You cough up bloody sputum that had not been present before.  You develop fever of 102 F (38.9 C) or greater.  You develop worsening pain at or near the incision site. MAKE SURE YOU:   Understand these instructions.  Will watch your condition.  Will get help right away if you are not doing well or get worse. Document Released: 01/02/2007 Document Revised: 11/14/2011 Document Reviewed: 03/05/2007 ExitCare Patient Information 2014 ExitCare, [place].   ________________________________________________________________________  WHAT IS A BLOOD TRANSFUSION? Blood Transfusion Information  A transfusion is the replacement of blood or some of its parts. Blood is made up of multiple cells which provide different functions.  Red blood cells carry oxygen and are used for blood loss replacement.  White blood cells fight against infection.  Platelets control bleeding.  Plasma helps clot blood.  Other blood products are available for specialized needs, such as hemophilia or other clotting disorders. BEFORE THE TRANSFUSION  Who gives blood for transfusions?   Healthy volunteers who are fully evaluated to make sure their blood is safe. This is blood bank blood. Transfusion therapy is the safest it has ever been in the practice of  medicine. Before blood is taken from a donor, a complete history is taken to make sure that person has no history of diseases nor engages in risky social behavior (examples are intravenous drug use or sexual activity with multiple partners). The donor's travel history is screened to minimize risk of transmitting infections, such as malaria. The donated blood is tested for signs of infectious diseases, such as HIV and hepatitis. The blood is then tested to be sure it is compatible with you in order to minimize the chance of a transfusion reaction. If you or a relative donates blood, this is often done in anticipation of surgery and is not appropriate for emergency situations. It takes many days to process the donated blood. RISKS AND COMPLICATIONS Although transfusion therapy is very safe and saves many lives, the main dangers of transfusion include:   Getting an infectious disease.  Developing a transfusion reaction. This is an allergic reaction to something in the blood you were given. Every precaution is taken to prevent this. The decision to have a blood transfusion has been considered carefully by your caregiver before blood is given. Blood is not given unless the benefits outweigh the risks. AFTER THE TRANSFUSION  Right after receiving a blood transfusion, you will usually  feel much better and more energetic. This is especially true if your red blood cells have gotten low (anemic). The transfusion raises the level of the red blood cells which carry oxygen, and this usually causes an energy increase.  The nurse administering the transfusion will monitor you carefully for complications. HOME CARE INSTRUCTIONS  No special instructions are needed after a transfusion. You may find your energy is better. Speak with your caregiver about any limitations on activity for underlying diseases you may have. SEEK MEDICAL CARE IF:   Your condition is not improving after your transfusion.  You develop  redness or irritation at the intravenous (IV) site. SEEK IMMEDIATE MEDICAL CARE IF:  Any of the following symptoms occur over the next 12 hours:  Shaking chills.  You have a temperature by mouth above 102 F (38.9 C), not controlled by medicine.  Chest, back, or muscle pain.  People around you feel you are not acting correctly or are confused.  Shortness of breath or difficulty breathing.  Dizziness and fainting.  You get a rash or develop hives.  You have a decrease in urine output.  Your urine turns a dark color or changes to pink, red, or brown. Any of the following symptoms occur over the next 10 days:  You have a temperature by mouth above 102 F (38.9 C), not controlled by medicine.  Shortness of breath.  Weakness after normal activity.  The white part of the eye turns yellow (jaundice).  You have a decrease in the amount of urine or are urinating less often.  Your urine turns a dark color or changes to pink, red, or brown. Document Released: 08/19/2000 Document Revised: 11/14/2011 Document Reviewed: 04/07/2008 ExitCare Patient Information 2014 ExitCare, [place].  _______________________________________________________________________   CLEAR LIQUID DIET   Foods Allowed                                                                     Foods Excluded  Coffee and tea, regular and decaf                             liquids that you cannot  Plain Jell-O in any flavor                                             see through such as: Fruit ices (not with fruit pulp)                                     milk, soups, orange juice  Iced Popsicles                                    All solid food Carbonated beverages, regular and diet                                    Cranberry, grape  and apple juices Sports drinks like Gatorade Lightly seasoned clear broth or consume(fat free) Sugar, honey syrup  Sample Menu Breakfast                                Lunch                                      Supper Cranberry juice                    Beef broth                            Chicken broth Jell-O                                     Grape juice                           Apple juice Coffee or tea                        Jell-O                                      Popsicle                                                Coffee or tea                        Coffee or tea  _____________________________________________________________________

## 2016-08-05 NOTE — Progress Notes (Signed)
Clearance note per chart per Lemar Livings NP

## 2016-08-10 NOTE — H&P (Signed)
TOTAL HIP ADMISSION H&P  Patient is admitted for left total hip arthroplasty.  Subjective:  Chief Complaint: left hip pain  HPI: Mario Carpenter, 60 y.o. male, has a history of pain and functional disability in the left hip(s) due to avascular necrosis and patient has failed non-surgical conservative treatments for greater than 12 weeks to include NSAID's and/or analgesics, flexibility and strengthening excercises and activity modification.  Onset of symptoms was gradual starting 2 years ago with gradually worsening course since that time.The patient noted no past surgery on the left hip(s).  Patient currently rates pain in the left hip at 8 out of 10 with activity. Patient has night pain, worsening of pain with activity and weight bearing, pain that interfers with activities of daily living, pain with passive range of motion and crepitus. Patient has evidence of subchondral cysts, subchondral sclerosis, joint space narrowing and necrosis of the femoral head by imaging studies. This condition presents safety issues increasing the risk of falls. This patient has had avascular necrosis of the hip.  There is no current active infection.  Patient Active Problem List   Diagnosis Date Noted  . Erectile dysfunction 12/21/2015  . HTN (hypertension) 12/08/2014  . BPH with obstruction/lower urinary tract symptoms 09/22/2014  . Visit for preventive health examination 09/22/2014  . Degenerative joint disease (DJD) of hip 09/22/2014  . Routine general medical examination at a health care facility 09/20/2013  . Cervical disc disease 03/24/2013  . Annual physical exam 09/16/2012  . Pure hypercholesterolemia 11/11/2010  . Psoriasis 11/11/2010  . Chronic pain in right foot 11/11/2010   Past Medical History:  Diagnosis Date  . Alcoholism ([place])    Resolved  . Arthritis   . Foot pain, right   . History of bronchitis   . History of chicken pox   . Hypertension   . Nocturia   . Psoriasis   .  Urinary frequency     Past Surgical History:  Procedure Laterality Date  . APPENDECTOMY    . HERNIA REPAIR    . KNEE SURGERY     right times 2  . WISDOM TOOTH EXTRACTION       Current Outpatient Prescriptions:  .  amLODipine (NORVASC) 5 MG tablet, Take 1 tablet (5 mg total) by mouth daily., Disp: 90 tablet, Rfl: 1 .  amoxicillin-clavulanate (AUGMENTIN) 875-125 MG tablet, Take 1 tablet by mouth 2 (two) times daily., Disp: 20 tablet, Rfl: 0 .  aspirin EC 81 MG tablet, Take 1 tablet (81 mg total) by mouth daily., Disp: , Rfl:  .  betamethasone dipropionate (DIPROLENE) 0.05 % ointment, Apply topically 2 (two) times daily. (Patient taking differently: Apply 1 application topically 2 (two) times daily as needed (psoriasis). ), Disp: 60 g, Rfl: 6 .  calcipotriene (DOVONOX) 0.005 % cream, Apply topically 2 (two) times daily. (Patient taking differently: Apply 1 application topically 2 (two) times daily as needed (psoriasis). ), Disp: 60 g, Rfl: 6 .  meloxicam (MOBIC) 7.5 MG tablet, Take 1 tablet (7.5 mg total) by mouth daily as needed. (Patient taking differently: Take 7.5 mg by mouth 2 (two) times daily as needed for pain. ), Disp: 90 tablet, Rfl: 1 .  sildenafil (VIAGRA) 100 MG tablet, Take 1 tablet (100 mg total) by mouth daily as needed for erectile dysfunction., Disp: 5 tablet, Rfl: 11 .  tamsulosin (FLOMAX) 0.4 MG CAPS capsule, Take 1 capsule (0.4 mg total) by mouth daily., Disp: 90 capsule, Rfl: 1  No Known Allergies  Social History  Substance Use Topics  . Smoking status: Former Smoker    Packs/day: 2.00    Years: 25.00    Types: Cigarettes  . Smokeless tobacco: Never Used  . Alcohol use No     Comment: hx of alcoholism; quit 15 years ago     Family History  Problem Relation Age of Onset  . Heart disease Mother 28    Deceased  . Heart defect Mother   . Diabetes Son     type 1  . Diabetes Father 37    Deceased  . Healthy Brother     x1  . Healthy Sister     x2     Review  of Systems  Constitutional: Negative.   HENT: Negative.   Eyes: Negative.   Respiratory: Negative.   Cardiovascular: Negative.   Gastrointestinal: Positive for constipation. Negative for abdominal pain, blood in stool, diarrhea, heartburn, melena, nausea and vomiting.  Genitourinary: Positive for frequency. Negative for dysuria, flank pain, hematuria and urgency.  Musculoskeletal: Positive for joint pain and myalgias. Negative for back pain, falls and neck pain.  Skin: Negative.   Neurological: Negative.   Endo/Heme/Allergies: Negative.   Psychiatric/Behavioral: Negative.     Objective:  Physical Exam  Constitutional: He is oriented to person, place, and time. He appears well-developed. No distress.  Obese  HENT:  Head: Normocephalic and atraumatic.  Right Ear: External ear normal.  Left Ear: External ear normal.  Nose: Nose normal.  Mouth/Throat: Oropharynx is clear and moist.  Eyes: Conjunctivae and EOM are normal.  Neck: Normal range of motion. Neck supple.  Cardiovascular: Normal rate, regular rhythm, normal heart sounds and intact distal pulses.   No murmur heard. Respiratory: Effort normal and breath sounds normal. No respiratory distress. He has no wheezes.  GI: Soft. Bowel sounds are normal. He exhibits no distension. There is no tenderness.  Musculoskeletal:       Right hip: Normal.       Left hip: He exhibits decreased range of motion and crepitus.       Right knee: Normal.       Left knee: Normal.  His left hip can be flexed to 100, no internal rotation, about 20 external rotation and 20 abduction. He has slight tenderness over the greater trochanter. He has a significantly antalgic gait pattern.  Neurological: He is alert and oriented to person, place, and time. He has normal strength. No sensory deficit.  Skin: No rash noted. He is not diaphoretic. No erythema.  Psychiatric: He has a normal mood and affect. His behavior is normal.    Vitals  Weight: 250 lb  Height: 74in Body Surface Area: 2.39 m Body Mass Index: 32.1 kg/m  Pulse: 76 (Regular)  BP: 142/78 (Sitting, Left Arm, Standard)  Imaging Review Plain radiographs demonstrate severe degenerative joint disease of the left hip(s). The bone quality appears to be fair for age and reported activity level.  Assessment/Plan:  Avascular necrosis of femoral head, left hip(s)  The patient history, physical examination, clinical judgement of the provider and imaging studies are consistent with end stage degenerative joint disease of the left hip(s) and total hip arthroplasty is deemed medically necessary. The treatment options including medical management, injection therapy, arthroscopy and arthroplasty were discussed at length. The risks and benefits of total hip arthroplasty were presented and reviewed. The risks due to aseptic loosening, infection, stiffness, dislocation/subluxation,  thromboembolic complications and other imponderables were discussed.  The patient acknowledged the explanation, agreed to proceed with the plan  and consent was signed. Patient is being admitted for inpatient treatment for surgery, pain control, PT, OT, prophylactic antibiotics, VTE prophylaxis, progressive ambulation and ADL's and discharge planning.The patient is planning to be discharged home with home health services    PCP: Dr. [name] Home with wife Therapy Plans: HHPT Patient prefers no IV in hands   [employer], [place]

## 2016-08-12 ENCOUNTER — Encounter (HOSPITAL_COMMUNITY): Payer: Self-pay | Admitting: *Deleted

## 2016-08-12 ENCOUNTER — Inpatient Hospital Stay (HOSPITAL_COMMUNITY): Payer: Commercial Managed Care - PPO | Admitting: Anesthesiology

## 2016-08-12 ENCOUNTER — Inpatient Hospital Stay (HOSPITAL_COMMUNITY)
Admission: RE | Admit: 2016-08-12 | Discharge: 2016-08-13 | DRG: 470 | Disposition: A | Discharge status: Home Health | Payer: Commercial Managed Care - PPO | Source: Ambulatory Visit | Attending: Orthopedic Surgery | Admitting: Orthopedic Surgery

## 2016-08-12 ENCOUNTER — Inpatient Hospital Stay (HOSPITAL_COMMUNITY): Payer: Commercial Managed Care - PPO

## 2016-08-12 ENCOUNTER — Encounter (HOSPITAL_COMMUNITY)
Admission: RE | Disposition: A | Discharge status: Home Health | Payer: Self-pay | Source: Ambulatory Visit | Attending: Orthopedic Surgery

## 2016-08-12 DIAGNOSIS — M879 Osteonecrosis, unspecified: Principal | ICD-10-CM | POA: Diagnosis present

## 2016-08-12 DIAGNOSIS — N401 Enlarged prostate with lower urinary tract symptoms: Secondary | ICD-10-CM | POA: Diagnosis present

## 2016-08-12 DIAGNOSIS — N138 Other obstructive and reflux uropathy: Secondary | ICD-10-CM | POA: Diagnosis present

## 2016-08-12 DIAGNOSIS — M169 Osteoarthritis of hip, unspecified: Secondary | ICD-10-CM | POA: Diagnosis present

## 2016-08-12 DIAGNOSIS — Z7982 Long term (current) use of aspirin: Secondary | ICD-10-CM

## 2016-08-12 DIAGNOSIS — Z87891 Personal history of nicotine dependence: Secondary | ICD-10-CM

## 2016-08-12 DIAGNOSIS — M25552 Pain in left hip: Secondary | ICD-10-CM | POA: Diagnosis present

## 2016-08-12 DIAGNOSIS — M1612 Unilateral primary osteoarthritis, left hip: Secondary | ICD-10-CM | POA: Diagnosis present

## 2016-08-12 DIAGNOSIS — L409 Psoriasis, unspecified: Secondary | ICD-10-CM

## 2016-08-12 DIAGNOSIS — E7800 Pure hypercholesterolemia, unspecified: Secondary | ICD-10-CM | POA: Diagnosis present

## 2016-08-12 DIAGNOSIS — Z79899 Other long term (current) drug therapy: Secondary | ICD-10-CM

## 2016-08-12 DIAGNOSIS — I10 Essential (primary) hypertension: Secondary | ICD-10-CM | POA: Diagnosis present

## 2016-08-12 DIAGNOSIS — Z96649 Presence of unspecified artificial hip joint: Secondary | ICD-10-CM

## 2016-08-12 DIAGNOSIS — M87052 Idiopathic aseptic necrosis of left femur: Secondary | ICD-10-CM | POA: Diagnosis present

## 2016-08-12 HISTORY — PX: TOTAL HIP ARTHROPLASTY: SHX124

## 2016-08-12 SURGERY — ARTHROPLASTY, HIP, TOTAL, ANTERIOR APPROACH
Anesthesia: Monitor Anesthesia Care | Site: Hip | Laterality: Left

## 2016-08-12 MED ORDER — PHENOL 1.4 % MT LIQD
1.0000 | OROMUCOSAL | Status: DC | PRN
Start: 2016-08-12 — End: 2016-08-13

## 2016-08-12 MED ORDER — METOCLOPRAMIDE HCL 5 MG PO TABS: 5.0000 mg | ORAL_TABLET | Freq: Three times a day (TID) | ORAL | Status: DC | PRN

## 2016-08-12 MED ORDER — METHOCARBAMOL 500 MG PO TABS
500.0000 mg | ORAL_TABLET | Freq: Four times a day (QID) | ORAL | Status: DC | PRN
  Administered 2016-08-13: 500 mg via ORAL
  Filled 2016-08-12: qty 1

## 2016-08-12 MED ORDER — DIPHENHYDRAMINE HCL 12.5 MG/5ML PO ELIX: 12.5000 mg | ORAL_SOLUTION | ORAL | Status: DC | PRN

## 2016-08-12 MED ORDER — MIDAZOLAM HCL 2 MG/2ML IJ SOLN
INTRAMUSCULAR | Status: AC
  Filled 2016-08-12: qty 2

## 2016-08-12 MED ORDER — OXYCODONE HCL 5 MG/5ML PO SOLN: 5.0000 mg | Freq: Once | ORAL | Status: DC | PRN

## 2016-08-12 MED ORDER — PROPOFOL 10 MG/ML IV BOLUS
INTRAVENOUS | Status: AC
  Filled 2016-08-12: qty 20

## 2016-08-12 MED ORDER — PHENYLEPHRINE 40 MCG/ML (10ML) SYRINGE FOR IV PUSH (FOR BLOOD PRESSURE SUPPORT)
PREFILLED_SYRINGE | INTRAVENOUS | Status: AC
  Filled 2016-08-12: qty 10

## 2016-08-12 MED ORDER — PROPOFOL 10 MG/ML IV BOLUS
INTRAVENOUS | Status: DC | PRN
  Administered 2016-08-12: 10 mg via INTRAVENOUS
  Administered 2016-08-12: 20 mg via INTRAVENOUS

## 2016-08-12 MED ORDER — CHLORHEXIDINE GLUCONATE 4 % EX LIQD: 60.0000 mL | Freq: Once | CUTANEOUS | Status: DC

## 2016-08-12 MED ORDER — ACETAMINOPHEN 325 MG PO TABS: 650.0000 mg | ORAL_TABLET | Freq: Four times a day (QID) | ORAL | Status: DC | PRN

## 2016-08-12 MED ORDER — ONDANSETRON HCL 4 MG/2ML IJ SOLN: 4.0000 mg | Freq: Four times a day (QID) | INTRAMUSCULAR | Status: DC | PRN

## 2016-08-12 MED ORDER — ONDANSETRON HCL 4 MG PO TABS: 4.0000 mg | ORAL_TABLET | Freq: Four times a day (QID) | ORAL | Status: DC | PRN

## 2016-08-12 MED ORDER — ACETAMINOPHEN 650 MG RE SUPP: 650.0000 mg | Freq: Four times a day (QID) | RECTAL | Status: DC | PRN

## 2016-08-12 MED ORDER — FENTANYL CITRATE (PF) 100 MCG/2ML IJ SOLN
INTRAMUSCULAR | Status: DC | PRN
  Administered 2016-08-12 (×2): 50 ug via INTRAVENOUS

## 2016-08-12 MED ORDER — BUPIVACAINE HCL (PF) 0.25 % IJ SOLN
INTRAMUSCULAR | Status: AC
  Filled 2016-08-12: qty 30

## 2016-08-12 MED ORDER — ACETAMINOPHEN 500 MG PO TABS
1000.0000 mg | ORAL_TABLET | Freq: Four times a day (QID) | ORAL | Status: DC
  Administered 2016-08-12 – 2016-08-13 (×3): 1000 mg via ORAL
  Filled 2016-08-12 (×3): qty 2

## 2016-08-12 MED ORDER — CEFAZOLIN SODIUM-DEXTROSE 2-4 GM/100ML-% IV SOLN
INTRAVENOUS | Status: AC
  Filled 2016-08-12: qty 100

## 2016-08-12 MED ORDER — BUPIVACAINE HCL (PF) 0.5 % IJ SOLN
INTRAMUSCULAR | Status: DC | PRN
  Administered 2016-08-12: 3 mL via INTRATHECAL

## 2016-08-12 MED ORDER — OXYCODONE HCL 5 MG PO TABS: 5.0000 mg | ORAL_TABLET | Freq: Once | ORAL | Status: DC | PRN

## 2016-08-12 MED ORDER — TRANEXAMIC ACID 1000 MG/10ML IV SOLN
1000.0000 mg | INTRAVENOUS | Status: AC
  Administered 2016-08-12: 1000 mg via INTRAVENOUS
  Filled 2016-08-12: qty 1100

## 2016-08-12 MED ORDER — DEXAMETHASONE SODIUM PHOSPHATE 10 MG/ML IJ SOLN
10.0000 mg | Freq: Once | INTRAMUSCULAR | Status: AC
  Administered 2016-08-13: 10 mg via INTRAVENOUS
  Filled 2016-08-12: qty 1

## 2016-08-12 MED ORDER — ACETAMINOPHEN 10 MG/ML IV SOLN
INTRAVENOUS | Status: AC
  Filled 2016-08-12: qty 100

## 2016-08-12 MED ORDER — OXYCODONE HCL 5 MG PO TABS
5.0000 mg | ORAL_TABLET | ORAL | Status: DC | PRN
  Administered 2016-08-12: 5 mg via ORAL
  Administered 2016-08-13: 10 mg via ORAL
  Administered 2016-08-13 (×2): 5 mg via ORAL
  Filled 2016-08-12: qty 1
  Filled 2016-08-12: qty 2
  Filled 2016-08-12: qty 1
  Filled 2016-08-12: qty 2

## 2016-08-12 MED ORDER — LACTATED RINGERS IV SOLN: INTRAVENOUS | Status: DC

## 2016-08-12 MED ORDER — DOCUSATE SODIUM 100 MG PO CAPS
100.0000 mg | ORAL_CAPSULE | Freq: Two times a day (BID) | ORAL | Status: DC
  Administered 2016-08-12 – 2016-08-13 (×2): 100 mg via ORAL
  Filled 2016-08-12 (×2): qty 1

## 2016-08-12 MED ORDER — SODIUM CHLORIDE 0.9 % IV SOLN
INTRAVENOUS | Status: DC
  Administered 2016-08-12: 22:00:00 via INTRAVENOUS

## 2016-08-12 MED ORDER — PROPOFOL 10 MG/ML IV BOLUS
INTRAVENOUS | Status: AC
  Filled 2016-08-12: qty 40

## 2016-08-12 MED ORDER — FLEET ENEMA 7-19 GM/118ML RE ENEM: 1.0000 | ENEMA | Freq: Once | RECTAL | Status: DC | PRN

## 2016-08-12 MED ORDER — MENTHOL 3 MG MT LOZG
1.0000 | LOZENGE | OROMUCOSAL | Status: DC | PRN
Start: 2016-08-12 — End: 2016-08-13

## 2016-08-12 MED ORDER — ONDANSETRON HCL 4 MG/2ML IJ SOLN
INTRAMUSCULAR | Status: DC | PRN
Start: 2016-08-12 — End: 2016-08-12
  Administered 2016-08-12: 4 mg via INTRAVENOUS

## 2016-08-12 MED ORDER — METHOCARBAMOL 1000 MG/10ML IJ SOLN
500.0000 mg | Freq: Four times a day (QID) | INTRAVENOUS | Status: DC | PRN
  Administered 2016-08-12: 500 mg via INTRAVENOUS
  Filled 2016-08-12: qty 550
  Filled 2016-08-12: qty 5

## 2016-08-12 MED ORDER — POLYETHYLENE GLYCOL 3350 17 G PO PACK: 17.0000 g | PACK | Freq: Every day | ORAL | Status: DC | PRN

## 2016-08-12 MED ORDER — MORPHINE SULFATE (PF) 2 MG/ML IV SOLN: 1.0000 mg | INTRAVENOUS | Status: DC | PRN

## 2016-08-12 MED ORDER — LACTATED RINGERS IV SOLN
INTRAVENOUS | Status: DC
  Administered 2016-08-12 (×3): via INTRAVENOUS

## 2016-08-12 MED ORDER — HYDROMORPHONE HCL 1 MG/ML IJ SOLN: 0.2500 mg | INTRAMUSCULAR | Status: DC | PRN

## 2016-08-12 MED ORDER — CEFAZOLIN SODIUM-DEXTROSE 2-4 GM/100ML-% IV SOLN
2.0000 g | INTRAVENOUS | Status: AC
  Administered 2016-08-12: 2 g via INTRAVENOUS

## 2016-08-12 MED ORDER — AMLODIPINE BESYLATE 5 MG PO TABS
5.0000 mg | ORAL_TABLET | Freq: Every day | ORAL | Status: DC
  Administered 2016-08-13: 5 mg via ORAL
  Filled 2016-08-12: qty 1

## 2016-08-12 MED ORDER — BUPIVACAINE HCL (PF) 0.5 % IJ SOLN
INTRAMUSCULAR | Status: AC
  Filled 2016-08-12: qty 30

## 2016-08-12 MED ORDER — TRANEXAMIC ACID 1000 MG/10ML IV SOLN
1000.0000 mg | Freq: Once | INTRAVENOUS | Status: AC
  Administered 2016-08-12: 1000 mg via INTRAVENOUS
  Filled 2016-08-12: qty 10

## 2016-08-12 MED ORDER — TRAMADOL HCL 50 MG PO TABS: 50.0000 mg | ORAL_TABLET | Freq: Four times a day (QID) | ORAL | Status: DC | PRN

## 2016-08-12 MED ORDER — ACETAMINOPHEN 10 MG/ML IV SOLN
1000.0000 mg | Freq: Once | INTRAVENOUS | Status: AC
  Administered 2016-08-12: 1000 mg via INTRAVENOUS

## 2016-08-12 MED ORDER — DEXAMETHASONE SODIUM PHOSPHATE 10 MG/ML IJ SOLN
INTRAMUSCULAR | Status: AC
  Filled 2016-08-12: qty 1

## 2016-08-12 MED ORDER — BISACODYL 10 MG RE SUPP: 10.0000 mg | Freq: Every day | RECTAL | Status: DC | PRN

## 2016-08-12 MED ORDER — MIDAZOLAM HCL 5 MG/5ML IJ SOLN
INTRAMUSCULAR | Status: DC | PRN
  Administered 2016-08-12: 2 mg via INTRAVENOUS

## 2016-08-12 MED ORDER — CEFAZOLIN SODIUM-DEXTROSE 2-4 GM/100ML-% IV SOLN
2.0000 g | Freq: Four times a day (QID) | INTRAVENOUS | Status: AC
  Administered 2016-08-12 – 2016-08-13 (×2): 2 g via INTRAVENOUS
  Filled 2016-08-12 (×2): qty 100

## 2016-08-12 MED ORDER — BUPIVACAINE HCL (PF) 0.25 % IJ SOLN
INTRAMUSCULAR | Status: DC | PRN
  Administered 2016-08-12: 30 mL

## 2016-08-12 MED ORDER — PROPOFOL 500 MG/50ML IV EMUL
INTRAVENOUS | Status: DC | PRN
  Administered 2016-08-12: 75 ug/kg/min via INTRAVENOUS

## 2016-08-12 MED ORDER — PHENYLEPHRINE 40 MCG/ML (10ML) SYRINGE FOR IV PUSH (FOR BLOOD PRESSURE SUPPORT)
PREFILLED_SYRINGE | INTRAVENOUS | Status: DC | PRN
  Administered 2016-08-12: 120 ug via INTRAVENOUS
  Administered 2016-08-12 (×5): 80 ug via INTRAVENOUS

## 2016-08-12 MED ORDER — FENTANYL CITRATE (PF) 100 MCG/2ML IJ SOLN
INTRAMUSCULAR | Status: AC
  Filled 2016-08-12: qty 2

## 2016-08-12 MED ORDER — METOCLOPRAMIDE HCL 5 MG/ML IJ SOLN: 5.0000 mg | Freq: Three times a day (TID) | INTRAMUSCULAR | Status: DC | PRN

## 2016-08-12 MED ORDER — RIVAROXABAN 10 MG PO TABS
10.0000 mg | ORAL_TABLET | Freq: Every day | ORAL | Status: DC
  Administered 2016-08-13: 10 mg via ORAL
  Filled 2016-08-12: qty 1

## 2016-08-12 MED ORDER — DEXAMETHASONE SODIUM PHOSPHATE 10 MG/ML IJ SOLN
10.0000 mg | Freq: Once | INTRAMUSCULAR | Status: AC
Start: 2016-08-12 — End: 2016-08-12
  Administered 2016-08-12: 10 mg via INTRAVENOUS

## 2016-08-12 MED ORDER — 0.9 % SODIUM CHLORIDE (POUR BTL) OPTIME
TOPICAL | Status: DC | PRN
  Administered 2016-08-12: 1000 mL

## 2016-08-12 MED ORDER — TAMSULOSIN HCL 0.4 MG PO CAPS
0.4000 mg | ORAL_CAPSULE | Freq: Every day | ORAL | Status: DC
  Administered 2016-08-13: 0.4 mg via ORAL
  Filled 2016-08-12: qty 1

## 2016-08-12 SURGICAL SUPPLY — 32 items
BAG DECANTER FOR FLEXI CONT (MISCELLANEOUS) ×2 IMPLANT
BAG ZIPLOCK 12X15 (MISCELLANEOUS) IMPLANT
BLADE SAG 18X100X1.27 (BLADE) ×2 IMPLANT
CAPT HIP TOTAL 2 ×2 IMPLANT
CLOTH BEACON ORANGE TIMEOUT ST (SAFETY) ×2 IMPLANT
COVER PERINEAL POST (MISCELLANEOUS) ×2 IMPLANT
DECANTER SPIKE VIAL GLASS SM (MISCELLANEOUS) ×2 IMPLANT
DRAPE STERI IOBAN 125X83 (DRAPES) ×2 IMPLANT
DRAPE U-SHAPE 47X51 STRL (DRAPES) ×4 IMPLANT
DRSG ADAPTIC 3X8 NADH LF (GAUZE/BANDAGES/DRESSINGS) ×2 IMPLANT
DRSG MEPILEX BORDER 4X4 (GAUZE/BANDAGES/DRESSINGS) ×2 IMPLANT
DRSG MEPILEX BORDER 4X8 (GAUZE/BANDAGES/DRESSINGS) ×2 IMPLANT
DURAPREP 26ML APPLICATOR (WOUND CARE) ×2 IMPLANT
ELECT REM PT RETURN 9FT ADLT (ELECTROSURGICAL) ×2
ELECTRODE REM PT RTRN 9FT ADLT (ELECTROSURGICAL) ×1 IMPLANT
EVACUATOR 1/8 PVC DRAIN (DRAIN) ×2 IMPLANT
GLOVE BIO SURGEON STRL SZ7.5 (GLOVE) ×2 IMPLANT
GLOVE BIO SURGEON STRL SZ8 (GLOVE) ×2 IMPLANT
GLOVE BIOGEL PI IND STRL 8 (GLOVE) ×2 IMPLANT
GLOVE BIOGEL PI INDICATOR 8 (GLOVE) ×2
GOWN STRL REUS W/TWL LRG LVL3 (GOWN DISPOSABLE) ×2 IMPLANT
GOWN STRL REUS W/TWL XL LVL3 (GOWN DISPOSABLE) ×2 IMPLANT
PACK ANTERIOR HIP CUSTOM (KITS) ×2 IMPLANT
STRIP CLOSURE SKIN 1/2X4 (GAUZE/BANDAGES/DRESSINGS) ×4 IMPLANT
SUT ETHIBOND NAB CT1 #1 30IN (SUTURE) ×2 IMPLANT
SUT MNCRL AB 4-0 PS2 18 (SUTURE) ×2 IMPLANT
SUT VIC AB 2-0 CT1 27 (SUTURE) ×2
SUT VIC AB 2-0 CT1 TAPERPNT 27 (SUTURE) ×2 IMPLANT
SUT VLOC 180 0 24IN GS25 (SUTURE) ×2 IMPLANT
SYR 50ML LL SCALE MARK (SYRINGE) IMPLANT
TRAY FOLEY W/METER SILVER 16FR (SET/KITS/TRAYS/PACK) ×2 IMPLANT
YANKAUER SUCT BULB TIP 10FT TU (MISCELLANEOUS) ×2 IMPLANT

## 2016-08-12 NOTE — Anesthesia Procedure Notes (Signed)
Spinal  Patient location during procedure: OR Start time: 08/12/2016 3:05 PM End time: 08/12/2016 3:12 PM Staffing Anesthesiologist: Marcie Bal, ADAM Resident/CRNA: Darlys Gales R Performed: resident/CRNA  Preanesthetic Checklist Completed: patient identified, site marked, surgical consent, pre-op evaluation, timeout performed, IV checked, risks and benefits discussed and monitors and equipment checked Spinal Block Patient position: sitting Prep: DuraPrep Patient monitoring: heart rate, continuous pulse ox and blood pressure Approach: midline Location: L2-3 Needle Needle type: Pencan  Needle gauge: 24 G Needle length: 10 cm Needle insertion depth: 7.5 cm Assessment Sensory level: T6 Additional Notes Lot [id number] Exp 2017-09-04

## 2016-08-12 NOTE — Interval H&P Note (Signed)
History and Physical Interval Note:  08/12/2016 2:51 PM  Mario Carpenter  has presented today for surgery, with the diagnosis of LEFT HIP OA WITH AVN  The various methods of treatment have been discussed with the patient and family. After consideration of risks, benefits and other options for treatment, the patient has consented to  Procedure(s): LEFT TOTAL HIP ARTHROPLASTY ANTERIOR APPROACH (Left) as a surgical intervention .  The patient's history has been reviewed, patient examined, no change in status, stable for surgery.  I have reviewed the patient's chart and labs.  Questions were answered to the patient's satisfaction.     [name]

## 2016-08-12 NOTE — Anesthesia Preprocedure Evaluation (Signed)
Anesthesia Evaluation  Patient identified by MRN, date of birth, ID band Patient awake    Reviewed: Allergy & Precautions, H&P , NPO status , Patient's Chart, lab work & pertinent test results  Airway Mallampati: II   Neck ROM: full    Dental   Pulmonary former smoker,    breath sounds clear to auscultation       Cardiovascular hypertension,  Rhythm:regular Rate:Normal     Neuro/Psych    GI/Hepatic (+)     substance abuse  alcohol use,   Endo/Other    Renal/GU      Musculoskeletal  (+) Arthritis ,   Abdominal   Peds  Hematology   Anesthesia Other Findings   Reproductive/Obstetrics                             Anesthesia Physical Anesthesia Plan  ASA: II  Anesthesia Plan: MAC and Spinal   Post-op Pain Management:    Induction: Intravenous  Airway Management Planned: Simple Face Mask  Additional Equipment:   Intra-op Plan:   Post-operative Plan:   Informed Consent: I have reviewed the patients History and Physical, chart, labs and discussed the procedure including the risks, benefits and alternatives for the proposed anesthesia with the patient or authorized representative who has indicated his/her understanding and acceptance.     Plan Discussed with: CRNA, Anesthesiologist and Surgeon  Anesthesia Plan Comments:         Anesthesia Quick Evaluation

## 2016-08-12 NOTE — Transfer of Care (Signed)
Immediate Anesthesia Transfer of Care Note  Patient: Mario Carpenter  Procedure(s) Performed: Procedure(s): LEFT TOTAL HIP ARTHROPLASTY ANTERIOR APPROACH (Left)  Patient Location: PACU  Anesthesia Type:Spinal  Level of Consciousness:  sedated, patient cooperative and responds to stimulation  Airway & Oxygen Therapy:Patient Spontanous Breathing and Patient connected to face mask oxgen  Post-op Assessment:  Report given to PACU RN and Post -op Vital signs reviewed and stable  Post vital signs:  Reviewed and stable  Last Vitals:  Vitals:   08/12/16 1230  BP: (!) 152/90  Pulse: 89  Resp: 18  Temp: [id number] C    Complications: No apparent anesthesia complications

## 2016-08-12 NOTE — Op Note (Signed)
OPERATIVE REPORT- TOTAL HIP ARTHROPLASTY   PREOPERATIVE DIAGNOSIS: Osteonecrosis of the Left hip.   POSTOPERATIVE DIAGNOSIS: Osteonecrosis of the Left  hip.   PROCEDURE: Left total hip arthroplasty, anterior approach.   SURGEON: Gaynelle Arabian, MD   ASSISTANT: Arlee Muslim, PA-C  ANESTHESIA:  Spinal  ESTIMATED BLOOD LOSS:-300 ml   DRAINS: Hemovac x1.   COMPLICATIONS: None   CONDITION: PACU - hemodynamically stable.   BRIEF CLINICAL NOTE: Mario Carpenter is a 60 y.o. male who has advanced end-  stage osteonecrosis of their Left  hip with progressively worsening pain and  dysfunction.The patient has failed nonoperative management and presents for  total hip arthroplasty.   PROCEDURE IN DETAIL: After successful administration of spinal  anesthetic, the traction boots for the [facility] bed were placed on both  feet and the patient was placed onto the [facility] bed, boots placed into the leg  holders. The Left hip was then isolated from the perineum with plastic  drapes and prepped and draped in the usual sterile fashion. ASIS and  greater trochanter were marked and a oblique incision was made, starting  at about 1 cm lateral and 2 cm distal to the ASIS and coursing towards  the anterior cortex of the femur. The skin was cut with a 10 blade  through subcutaneous tissue to the level of the fascia overlying the  tensor fascia lata muscle. The fascia was then incised in line with the  incision at the junction of the anterior third and posterior 2/3rd. The  muscle was teased off the fascia and then the interval between the TFL  and the rectus was developed. The Hohmann retractor was then placed at  the top of the femoral neck over the capsule. The vessels overlying the  capsule were cauterized and the fat on top of the capsule was removed.  A Hohmann retractor was then placed anterior underneath the rectus  femoris to give exposure to the entire anterior capsule. A T-shaped   capsulotomy was performed. The edges were tagged and the femoral head  was identified.       Osteophytes are removed off the superior acetabulum.  The femoral neck was then cut in situ with an oscillating saw. Traction  was then applied to the left lower extremity utilizing the [facility]  traction. The femoral head was then removed. Retractors were placed  around the acetabulum and then circumferential removal of the labrum was  performed. Osteophytes were also removed. Reaming starts at 47 mm to  medialize and  Increased in 2 mm increments to 53 mm. We reamed in  approximately 40 degrees of abduction, 20 degrees anteversion. A 54 mm  pinnacle acetabular shell was then impacted in anatomic position under  fluoroscopic guidance with excellent purchase. We did not need to place  any additional dome screws. A 36 mm neutral + 4 marathon liner was then  placed into the acetabular shell.       The femoral lift was then placed along the lateral aspect of the femur  just distal to the vastus ridge. The leg was  externally rotated and capsule  was stripped off the inferior aspect of the femoral neck down to the  level of the lesser trochanter, this was done with electrocautery. The femur was lifted after this was performed. The  leg was then placed in an extended and adducted position essentially delivering the femur. We also removed the capsule superiorly and the piriformis from the piriformis fossa  to gain excellent exposure of the  proximal femur. Rongeur was used to remove some cancellous bone to get  into the lateral portion of the proximal femur for placement of the  initial starter reamer. The starter broaches was placed  the starter broach  and was shown to go down the center of the canal. Broaching  with the  Corail system was then performed starting at size 8, coursing  Up to size 13. A size 13 had excellent torsional and rotational  and axial stability. The trial high offset neck was then  placed  with a 36 + 1.5 trial head. The hip was then reduced. We confirmed that  the stem was in the canal both on AP and lateral x-rays. It also has excellent sizing. The hip was reduced with outstanding stability through full extension and full external rotation.. AP pelvis was taken and the leg lengths were measured and found to be equal. Hip was then dislocated again and the femoral head and neck removed. The  femoral broach was removed. Size 13 Corail stem with a high offset collarless (KHO) neck was then impacted into the femur following native anteversion. Has  excellent purchase in the canal. Excellent torsional and rotational and  axial stability. It is confirmed to be in the canal on AP and lateral  fluoroscopic views. The 36 + 1.5 ceramic head was placed and the hip  reduced with outstanding stability. Again AP pelvis was taken and it  confirmed that the leg lengths were equal. The wound was then copiously  irrigated with saline solution and the capsule reattached and repaired  with Ethibond suture. 30 ml of .25% Bupivicaine was  injected into the capsule and into the edge of the tensor fascia lata as well as subcutaneous tissue. The fascia overlying the tensor fascia lata was then closed with a running #1 V-Loc. Subcu was closed with interrupted 2-0 Vicryl and subcuticular running 4-0 Monocryl. Incision was cleaned  and dried. Steri-Strips and a bulky sterile dressing applied. Hemovac  drain was hooked to suction and then the patient was awakened and transported to  recovery in stable condition.        Please note that a surgical assistant was a medical necessity for this procedure to perform it in a safe and expeditious manner. Assistant was necessary to provide appropriate retraction of vital neurovascular structures and to prevent femoral fracture and allow for anatomic placement of the prosthesis.  Gaynelle Arabian, M.D.

## 2016-08-12 NOTE — Interval H&P Note (Signed)
History and Physical Interval Note:  08/12/2016 2:52 PM  Mario Carpenter  has presented today for surgery, with the diagnosis of LEFT HIP OA WITH AVN  The various methods of treatment have been discussed with the patient and family. After consideration of risks, benefits and other options for treatment, the patient has consented to  Procedure(s): LEFT TOTAL HIP ARTHROPLASTY ANTERIOR APPROACH (Left) as a surgical intervention .  The patient's history has been reviewed, patient examined, no change in status, stable for surgery.  I have reviewed the patient's chart and labs.  Questions were answered to the patient's satisfaction.     [name]

## 2016-08-13 LAB — BASIC METABOLIC PANEL WITH GFR
Anion gap: 7 (ref 5–15)
BUN: 14 mg/dL (ref 6–20)
CO2: 24 mmol/L (ref 22–32)
Calcium: 8.7 mg/dL — ABNORMAL LOW (ref 8.9–10.3)
Chloride: 104 mmol/L (ref 101–111)
Creatinine, Ser: 0.91 mg/dL (ref 0.61–1.24)
GFR calc Af Amer: >60 mL/min (ref >60)
GFR calc non Af Amer: >60 mL/min (ref >60)
Glucose, Bld: 171 mg/dL — ABNORMAL HIGH (ref 65–99)
Potassium: 4.4 mmol/L (ref 3.5–5.1)
Sodium: 135 mmol/L (ref 135–145)

## 2016-08-13 LAB — CBC
HEMATOCRIT: 38.3 % — AB (ref 39.0–52.0)
HEMOGLOBIN: 13 g/dL (ref 13.0–17.0)
MCH: 29.6 pg (ref 26.0–34.0)
MCHC: 33.9 g/dL (ref 30.0–36.0)
MCV: 87.2 fL (ref 78.0–100.0)
Platelets: 294 10*3/uL (ref 150–400)
RBC: 4.39 MIL/uL (ref 4.22–5.81)
RDW: 12.4 % (ref 11.5–15.5)
WBC: 12.7 10*3/uL — AB (ref 4.0–10.5)

## 2016-08-13 MED ORDER — CALCIPOTRIENE 0.005 % EX CREA: 1.0000 "application " | TOPICAL_CREAM | Freq: Two times a day (BID) | CUTANEOUS | 0 refills | Status: DC | PRN

## 2016-08-13 MED ORDER — RIVAROXABAN 10 MG PO TABS: 10.0000 mg | ORAL_TABLET | Freq: Every day | ORAL | 0 refills | Status: DC

## 2016-08-13 MED ORDER — METHOCARBAMOL 500 MG PO TABS: 500.0000 mg | ORAL_TABLET | Freq: Four times a day (QID) | ORAL | 1 refills | Status: DC | PRN

## 2016-08-13 MED ORDER — BETAMETHASONE DIPROPIONATE 0.05 % EX OINT: 1.0000 "application " | TOPICAL_OINTMENT | Freq: Two times a day (BID) | CUTANEOUS | 0 refills | Status: DC | PRN

## 2016-08-13 MED ORDER — OXYCODONE HCL 5 MG PO TABS: 5.0000 mg | ORAL_TABLET | ORAL | 0 refills | Status: DC | PRN

## 2016-08-13 MED ORDER — TRAMADOL HCL 50 MG PO TABS: 50.0000 mg | ORAL_TABLET | Freq: Four times a day (QID) | ORAL | 1 refills | Status: DC | PRN

## 2016-08-13 NOTE — Progress Notes (Signed)
   Subjective: 1 Day Post-Op Procedure(s) (LRB): LEFT TOTAL HIP ARTHROPLASTY ANTERIOR APPROACH (Left) Patient reports pain as mild.   We will start therapy today.  Plan is to go Home after hospital stay.  Objective: Vital signs in last 24 hours: Temp:  [97.8 F (36.6 C)-98.8 F (37.1 C)] 97.8 F (36.6 C) (12/09 0400) Pulse Rate:  [63-89] 74 (12/09 0400) Resp:  [11-20] 16 (12/09 0400) BP: (107-152)/(70-90) 143/75 (12/09 0400) SpO2:  [95 %-100 %] 96 % (12/09 0400) Weight:  [111.1 kg (245 lb)] 111.1 kg (245 lb) (12/08 1253)  Intake/Output from previous day:  Intake/Output Summary (Last 24 hours) at 08/13/16 0720 Last data filed at 08/13/16 0612  Gross per 24 hour  Intake          3513.33 ml  Output             3520 ml  Net            -6.67 ml    Intake/Output this shift: No intake/output data recorded.  Labs:  Recent Labs  08/13/16 0444  HGB 13.0    Recent Labs  08/13/16 0444  WBC 12.7*  RBC 4.39  HCT 38.3*  PLT 294    Recent Labs  08/13/16 0444  NA 135  K 4.4  CL 104  CO2 24  BUN 14  CREATININE 0.91  GLUCOSE 171*  CALCIUM 8.7*   No results for input(s): LABPT, INR in the last 72 hours.  EXAM General - Patient is Alert, Appropriate and Oriented Extremity - Neurologically intact Neurovascular intact No cellulitis present Compartment soft Dressing - dressing C/D/I Motor Function - intact, moving foot and toes well on exam.  Hemovac pulled without difficulty.  Past Medical History:  Diagnosis Date  . Alcoholism ([place])    Resolved  . Arthritis   . Foot pain, right   . History of bronchitis   . History of chicken pox   . Hypertension   . Nocturia   . Psoriasis   . Urinary frequency     Assessment/Plan: 1 Day Post-Op Procedure(s) (LRB): LEFT TOTAL HIP ARTHROPLASTY ANTERIOR APPROACH (Left) Principal Problem:   Avascular necrosis of bone of left hip (HCC) Active Problems:   OA (osteoarthritis) of hip   [facility]vance diet Up with  therapy D/C IV fluids Discharge home with home health  DVT Prophylaxis - Xarelto Weight Bearing As Tolerated left Leg Hemovac Pulled Begin Therapy  [name]

## 2016-08-13 NOTE — Progress Notes (Signed)
Patient set up with [facility] , ordered tall rolling walker from [facility] per Reggie.  Rayburn Ma RN, BSN, CM

## 2016-08-13 NOTE — Care Management Note (Signed)
Case Management Note  Patient Details  Name: [name] MRN: [id number] Date of Birth: 1955/09/20  Subjective/Objective:                  POSTOPERATIVE DIAGNOSIS: Osteonecrosis of the Left  hip.   PROCEDURE: Left total hip arthroplasty, anterior approach.   Action/Plan:  Discharge home with HHPT  Expected Discharge Date:  08/13/16               Expected Discharge Plan:  [place]  In-House Referral:  NA  Discharge planning Services  CM Consult  Post Acute Care Choice:  Durable Medical Equipment Choice offered to:  Patient  DME Arranged:  [facility]unda DME Agency:  [place]:  PT [place] Agency:  [facility] ([facility])  Status of Service:  Completed, signed off  If discussed at [employer] of Stay Meetings, dates discussed:    Additional Comments:  Spoke to patient and wife, verified contact information [facility] health rep contacted for HHPT set up. Reggie called and delivered tall rolling walker.  No other CM needs at this time.   Rayburn Ma RN, BSN, CM   Corine Shelter, RN 08/13/2016, 12:09 PM

## 2016-08-13 NOTE — Progress Notes (Signed)
Physical Therapy Treatment Patient Details Name: Mario Carpenter MRN: [id number] DOB: Nov 16, 1955 Today's Date: 08/13/2016    History of Present Illness L DATHA    PT Comments    Ready for  DC.  Follow Up Recommendations  Home health PT;Supervision/Assistance - 24 hour     Equipment Recommendations  Rolling walker with 5" wheels    Recommendations for Other Services       Precautions / Restrictions Precautions Precautions: Fall    Mobility  Bed Mobility Overal bed mobility: Needs Assistance Bed Mobility: Supine to Sit;Sit to Supine     Supine to sit: Supervision Sit to supine: Supervision   General bed mobility comments: used sheet to self assist the left leg.  Transfers   Equipment used: Rolling walker (2 wheeled) Transfers: Sit to/from Stand Sit to Stand: Supervision            Ambulation/Gait Ambulation/Gait assistance: Supervision Ambulation Distance (Feet): 80 Feet Assistive device: Rolling walker (2 wheeled) Gait Pattern/deviations: Step-to pattern;Step-through pattern     General Gait Details: cues for posture   Stairs Stairs: Yes   Stair Management: Two rails;Step to pattern;Forwards Number of Stairs: 4 General stair comments: cues for sequence  Wheelchair Mobility    Modified Rankin (Stroke Patients Only)       Balance                                    Cognition                            Exercises Total Joint Exercises Ankle Circles/Pumps: AROM;Left;10 reps Short Arc Quad: AROM;Left;10 reps Heel Slides: AROM;Left;10 reps Hip ABduction/ADduction: AROM;Left;10 reps Long Arc Quad: AROM;Left;10 reps    General Comments        Pertinent Vitals/Pain Pain Score: 4  Pain Location: at times  in groin Pain Descriptors / Indicators: Sharp Pain Intervention(s): Monitored during session;Premedicated before session    Home Living                      Prior Function             PT Goals (current goals can now be found in the care plan section) Acute Rehab PT Goals Patient Stated Goal: tp go home PT Goal Formulation: With patient/family Time For Goal Achievement: 08/14/16 Potential to Achieve Goals: Good Progress towards PT goals: Progressing toward goals    Frequency    7X/week      PT Plan Current plan remains appropriate    Co-evaluation             End of Session   Activity Tolerance: Patient tolerated treatment well Patient left: in bed;with call bell/phone within reach     Time: 1400-1430 PT Time Calculation (min) (ACUTE ONLY): 30 min  Charges:  $Gait Training: 8-22 mins                    G Codes:      Claretha Cooper 08/13/2016, 3:53 PM

## 2016-08-13 NOTE — Discharge Instructions (Signed)
° °Dr. Frank Aluisio °Total Joint Specialist °[facility] °[address] °[place], [place] [address] °[phone] ° °ANTERIOR APPROACH TOTAL HIP REPLACEMENT POSTOPERATIVE DIRECTIONS ° ° °Hip Rehabilitation, Guidelines Following Surgery  °The results of a hip operation are greatly improved after range of motion and muscle strengthening exercises. Follow all safety measures which are given to protect your hip. If any of these exercises cause increased pain or swelling in your joint, decrease the amount until you are comfortable again. Then slowly increase the exercises. Call your caregiver if you have problems or questions.  ° °HOME CARE INSTRUCTIONS  °Remove items at home which could result in a fall. This includes throw rugs or furniture in walking pathways.  °· ICE to the affected hip every three hours for 30 minutes at a time and then as needed for pain and swelling.  Continue to use ice on the hip for pain and swelling from surgery. You may notice swelling that will progress down to the foot and ankle.  This is normal after surgery.  Elevate the leg when you are not up walking on it.   °· Continue to use the breathing machine which will help keep your temperature down.  It is common for your temperature to cycle up and down following surgery, especially at night when you are not up moving around and exerting yourself.  The breathing machine keeps your lungs expanded and your temperature down. ° ° °DIET °You may resume your previous home diet once your are discharged from the hospital. ° °DRESSING / WOUND CARE / SHOWERING °You may start showering once you are discharged home but do not submerge the incision under water. Just pat the incision dry and apply a dry gauze dressing on daily. °Change the surgical dressing daily and reapply a dry dressing each time. ° °ACTIVITY °Walk with your walker as instructed. °Use walker as long as suggested by your caregivers. °Avoid periods of inactivity  such as sitting longer than an hour when not asleep. This helps prevent blood clots.  °You may resume a sexual relationship in one month or when given the OK by your doctor.  °You may return to work once you are cleared by your doctor.  °Do not drive a car for 6 weeks or until released by you surgeon.  °Do not drive while taking narcotics. ° °WEIGHT BEARING °Weight bearing as tolerated with assist device (walker, cane, etc) as directed, use it as long as suggested by your surgeon or therapist, typically at least 4-6 weeks. ° °POSTOPERATIVE CONSTIPATION PROTOCOL °Constipation - defined medically as fewer than three stools per week and severe constipation as less than one stool per week. ° °One of the most common issues patients have following surgery is constipation.  Even if you have a regular bowel pattern at home, your normal regimen is likely to be disrupted due to multiple reasons following surgery.  Combination of anesthesia, postoperative narcotics, change in appetite and fluid intake all can affect your bowels.  In order to avoid complications following surgery, here are some recommendations in order to help you during your recovery period. ° °Colace (docusate) - Pick up an over-the-counter form of Colace or another stool softener and take twice a day as long as you are requiring postoperative pain medications.  Take with a full glass of water daily.  If you experience loose stools or diarrhea, hold the colace until you stool forms back up.  If your symptoms do not get better within 1   week or if they get worse, check with your doctor. ° °Dulcolax (bisacodyl) - Pick up over-the-counter and take as directed by the product packaging as needed to assist with the movement of your bowels.  Take with a full glass of water.  Use this product as needed if not relieved by Colace only.  ° °MiraLax (polyethylene glycol) - Pick up over-the-counter to have on hand.  MiraLax is a solution that will increase the amount of  water in your bowels to assist with bowel movements.  Take as directed and can mix with a glass of water, juice, soda, coffee, or tea.  Take if you go more than two days without a movement. °Do not use MiraLax more than once per day. Call your doctor if you are still constipated or irregular after using this medication for 7 days in a row. ° °If you continue to have problems with postoperative constipation, please contact the office for further assistance and recommendations.  If you experience "the worst abdominal pain ever" or develop nausea or vomiting, please contact the office immediatly for further recommendations for treatment. ° °ITCHING ° If you experience itching with your medications, try taking only a single pain pill, or even half a pain pill at a time.  You can also use Benadryl over the counter for itching or also to help with sleep.  ° °TED HOSE STOCKINGS °Wear the elastic stockings on both legs for three weeks following surgery during the day but you may remove then at night for sleeping. ° °MEDICATIONS °See your medication summary on the “After Visit Summary” that the nursing staff will review with you prior to discharge.  You may have some home medications which will be placed on hold until you complete the course of blood thinner medication.  It is important for you to complete the blood thinner medication as prescribed by your surgeon.  Continue your approved medications as instructed at time of discharge. ° °PRECAUTIONS °If you experience chest pain or shortness of breath - call 911 immediately for transfer to the hospital emergency department.  °If you develop a fever greater that 101 F, purulent drainage from wound, increased redness or drainage from wound, foul odor from the wound/dressing, or calf pain - CONTACT YOUR SURGEON.   °                                                °FOLLOW-UP APPOINTMENTS °Make sure you keep all of your appointments after your operation with your surgeon and  caregivers. You should call the office at the above phone number and make an appointment for approximately two weeks after the date of your surgery or on the date instructed by your surgeon outlined in the "After Visit Summary". ° °RANGE OF MOTION AND STRENGTHENING EXERCISES  °These exercises are designed to help you keep full movement of your hip joint. Follow your caregiver's or physical therapist's instructions. Perform all exercises about fifteen times, three times per day or as directed. Exercise both hips, even if you have had only one joint replacement. These exercises can be done on a training (exercise) mat, on the floor, on a table or on a bed. Use whatever works the best and is most comfortable for you. Use music or television while you are exercising so that the exercises are a pleasant break in your day. This   will make your life better with the exercises acting as a break in routine you can look forward to.  °Lying on your back, slowly slide your foot toward your buttocks, raising your knee up off the floor. Then slowly slide your foot back down until your leg is straight again.  °Lying on your back spread your legs as far apart as you can without causing discomfort.  °Lying on your side, raise your upper leg and foot straight up from the floor as far as is comfortable. Slowly lower the leg and repeat.  °Lying on your back, tighten up the muscle in the front of your thigh (quadriceps muscles). You can do this by keeping your leg straight and trying to raise your heel off the floor. This helps strengthen the largest muscle supporting your knee.  °Lying on your back, tighten up the muscles of your buttocks both with the legs straight and with the knee bent at a comfortable angle while keeping your heel on the floor.  ° °IF YOU ARE TRANSFERRED TO A SKILLED REHAB FACILITY °If the patient is transferred to a skilled rehab facility following release from the hospital, a list of the current medications will be  sent to the facility for the patient to continue.  When discharged from the skilled rehab facility, please have the facility set up the patient's Home Health Physical Therapy prior to being released. Also, the skilled facility will be responsible for providing the patient with their medications at time of release from the facility to include their pain medication, the muscle relaxants, and their blood thinner medication. If the patient is still at the rehab facility at time of the two week follow up appointment, the skilled rehab facility will also need to assist the patient in arranging follow up appointment in our office and any transportation needs. ° °MAKE SURE YOU:  °Understand these instructions.  °Get help right away if you are not doing well or get worse.  ° ° °Pick up stool softner and laxative for home use following surgery while on pain medications. °Do not submerge incision under water. °Please use good hand washing techniques while changing dressing each day. °May shower starting three days after surgery. °Please use a clean towel to pat the incision dry following showers. °Continue to use ice for pain and swelling after surgery. °Do not use any lotions or creams on the incision until instructed by your surgeon. ° °Information on my medicine - XARELTO® (Rivaroxaban) ° °This medication education was reviewed with me or my healthcare representative as part of my discharge preparation.  The pharmacist that spoke with me during my hospital stay was:  Corday Wyka A, RPH ° °Why was Xarelto® prescribed for you? °Xarelto® was prescribed for you to reduce the risk of blood clots forming after orthopedic surgery. The medical term for these abnormal blood clots is venous thromboembolism (VTE). ° °What do you need to know about xarelto® ? °Take your Xarelto® ONCE DAILY at the same time every day. °You may take it either with or without food. ° °If you have difficulty swallowing the tablet whole, you may crush it  and mix in applesauce just prior to taking your dose. ° °Take Xarelto® exactly as prescribed by your doctor and DO NOT stop taking Xarelto® without talking to the doctor who prescribed the medication.  Stopping without other VTE prevention medication to take the place of Xarelto® may increase your risk of developing a clot. ° °After discharge, you should have regular   check-up appointments with your healthcare provider that is prescribing your Xarelto®.   ° °What do you do if you miss a dose? °If you miss a dose, take it as soon as you remember on the same day then continue your regularly scheduled once daily regimen the next day. Do not take two doses of Xarelto® on the same day.  ° °Important Safety Information °A possible side effect of Xarelto® is bleeding. You should call your healthcare provider right away if you experience any of the following: °? Bleeding from an injury or your nose that does not stop. °? Unusual colored urine (red or dark brown) or unusual colored stools (red or black). °? Unusual bruising for unknown reasons. °? A serious fall or if you hit your head (even if there is no bleeding). ° °Some medicines may interact with Xarelto® and might increase your risk of bleeding while on Xarelto®. To help avoid this, consult your healthcare provider or pharmacist prior to using any new prescription or non-prescription medications, including herbals, vitamins, non-steroidal anti-inflammatory drugs (NSAIDs) and supplements. ° °This website has more information on Xarelto®: www.xarelto.com. ° ° ° °

## 2016-08-13 NOTE — Evaluation (Signed)
Occupational Therapy Evaluation Patient Details Name: Mario Carpenter MRN: [id number] DOB: 07/29/1956 Today's Date: 08/13/2016    History of Present Illness L DATHA   Clinical Impression   OT education complete regarding ADL activity s/p THA.    Follow Up Recommendations  No OT follow up    Equipment Recommendations  None recommended by OT       Precautions / Restrictions Precautions Precautions: Fall Restrictions Weight Bearing Restrictions: No      Mobility Bed Mobility Overal bed mobility: Needs Assistance Bed Mobility: Supine to Sit     Supine to sit: Min assist     General bed mobility comments: used sheet to self assist the left leg.  Transfers Overall transfer level: Needs assistance Equipment used: Rolling walker (2 wheeled) Transfers: Sit to/from Stand Sit to Stand: Min guard         General transfer comment: cues  for hand placement and left leg placement         ADL Overall ADL's : Needs assistance/impaired Eating/Feeding: Set up;Sitting   Grooming: Set up;Standing   Upper Body Bathing: Set up;Sitting   Lower Body Bathing: Minimal assistance;Sit to/from stand;Cueing for safety;Cueing for sequencing   Upper Body Dressing : Set up;Sitting   Lower Body Dressing: Minimal assistance;Sit to/from stand;Cueing for safety;Cueing for sequencing Lower Body Dressing Details (indicate cue type and reason): wife will A     Toileting- Clothing Manipulation and Hygiene: Min guard;Sit to/from stand;Cueing for safety;Cueing for sequencing                         Pertinent Vitals/Pain Pain Assessment: 0-10 Pain Score: 4  Pain Location: "pop" about the hip when ambulating that was 10 pain which decreased t04. RN notified.  Pain Descriptors / Indicators: Sore Pain Intervention(s): Monitored during session;Repositioned;Ice applied;RN gave pain meds during session     Hand Dominance     Extremity/Trunk Assessment Upper Extremity  Assessment Upper Extremity Assessment: Generalized weakness   Lower Extremity Assessment Lower Extremity Assessment: LLE deficits/detail LLE Deficits / Details: able to advance the leg       Communication Communication Communication: No difficulties   Cognition Arousal/Alertness: Awake/alert Behavior During Therapy: WFL for tasks assessed/performed Overall Cognitive Status: Within Functional Limits for tasks assessed                                Home Living Family/patient expects to be discharged to:: Private residence Living Arrangements: Spouse/significant other Available Help at Discharge: Family Type of Home: House Home Access: Stairs to enter Technical brewer of Steps: 4 Entrance Stairs-Rails: Right;Left Home Layout: One level     Bathroom Shower/Tub: Occupational psychologist: Handicapped height     Home Equipment: Toilet riser          Prior Functioning/Environment Level of Independence: Independent                                        End of Session Nurse Communication: Mobility status  Activity Tolerance: Patient tolerated treatment well Patient left: in bed;with call bell/phone within reach;with nursing/sitter in room;with family/visitor present   Time: [id number] OT Time Calculation (min): 12 min Charges:  OT General Charges $OT Visit: 1 Procedure OT Evaluation $OT Eval Low Complexity: 1 Procedure G-Codes:    Lilliahna Schubring,  Thereasa Parkin 08/13/2016, 11:38 AM

## 2016-08-13 NOTE — Evaluation (Signed)
Physical Therapy Evaluation Patient Details Name: Mario Carpenter MRN: [id number] DOB: 10/13/1955 Today's Date: 08/13/2016   History of Present Illness  L DATHA  Clinical Impression  The patient felt a pop about the left hip while ambulating. Sat down for a rst and ambulated without further  Problems. Pt admitted with above diagnosis. Pt currently with functional limitations due to the deficits listed below (see PT Problem List).  Pt will benefit from skilled PT to increase their independence and safety with mobility to allow discharge to the venue listed below.       Follow Up Recommendations Home health PT;Supervision/Assistance - 24 hour    Equipment Recommendations  Rolling walker with 5" wheels    Recommendations for Other Services       Precautions / Restrictions Precautions Precautions: Fall      Mobility  Bed Mobility Overal bed mobility: Needs Assistance Bed Mobility: Supine to Sit     Supine to sit: Min assist     General bed mobility comments: used sheet to self assist the left leg.  Transfers Overall transfer level: Needs assistance Equipment used: Rolling walker (2 wheeled) Transfers: Sit to/from Stand Sit to Stand: Min guard         General transfer comment: cues  for hand placement and left leg placement  Ambulation/Gait Ambulation/Gait assistance: Min assist Ambulation Distance (Feet): 20 Feet Assistive device: Rolling walker (2 wheeled) Gait Pattern/deviations: Step-to pattern;Step-through pattern;Antalgic     General Gait Details: then 40, then 20 x 2  Stairs            Wheelchair Mobility    Modified Rankin (Stroke Patients Only)       Balance                                             Pertinent Vitals/Pain Pain Assessment: 0-10 Pain Score: 4  Pain Location: "pop" about the hip when ambulating that was 10 pain which decreased t04. RN notified.  Pain Descriptors / Indicators: Sore Pain  Intervention(s): Monitored during session;Premedicated before session    Home Living Family/patient expects to be discharged to:: Private residence Living Arrangements: Spouse/significant other Available Help at Discharge: Family Type of Home: House Home Access: Stairs to enter Entrance Stairs-Rails: Psychiatric nurse of Steps: 4 Home Layout: One level Home Equipment: Toilet riser      Prior Function Level of Independence: Independent               Hand Dominance        Extremity/Trunk Assessment   Upper Extremity Assessment: Overall WFL for tasks assessed           Lower Extremity Assessment: LLE deficits/detail   LLE Deficits / Details: able to advance the leg     Communication   Communication: No difficulties  Cognition Arousal/Alertness: Awake/alert Behavior During Therapy: WFL for tasks assessed/performed Overall Cognitive Status: Within Functional Limits for tasks assessed                      General Comments      Exercises Total Joint Exercises Ankle Circles/Pumps: AROM;Left;10 reps Short Arc Quad: AROM;Left;10 reps Heel Slides: AROM;Left;10 reps Hip ABduction/ADduction: AROM;Left;10 reps Long Arc Quad: AROM;Left;10 reps   Assessment/Plan    PT Assessment Patient needs continued PT services  PT Problem List Decreased strength;Decreased range of motion;Decreased activity  tolerance;Decreased mobility;Pain;Decreased safety awareness          PT Treatment Interventions DME instruction;Gait training;Stair training;Functional mobility training;Therapeutic activities;Patient/family education    PT Goals (Current goals can be found in the Care Plan section)  Acute Rehab PT Goals Patient Stated Goal: tp go home PT Goal Formulation: With patient/family Time For Goal Achievement: 08/14/16 Potential to Achieve Goals: Good    Frequency 7X/week   Barriers to discharge        Co-evaluation               End of  Session   Activity Tolerance: Patient tolerated treatment well Patient left: in chair;with call bell/phone within reach;with family/visitor present Nurse Communication: Mobility status    Functional Limitation: Mobility: Walking and moving around    Time: [id number] PT Time Calculation (min) (ACUTE ONLY): 32 min   Charges:   PT Evaluation $PT Eval Low Complexity: 1 Procedure PT Treatments $Gait Training: 8-22 mins   PT G Codes:   PT G-Codes **NOT FOR INPATIENT CLASS** Functional Limitation: Mobility: Walking and moving around    [place], Shella Maxim 08/13/2016, 1:43 PM

## 2016-08-13 NOTE — Progress Notes (Signed)
Discharged from floor via w/c for transport home by car. Wife & belongings with pt. No changes in assessment. Pamela Maddy   

## 2016-08-15 ENCOUNTER — Encounter (HOSPITAL_COMMUNITY): Payer: Self-pay | Admitting: Orthopedic Surgery

## 2016-08-15 LAB — TYPE AND SCREEN
ABO/RH(D): O POS
Antibody Screen: NEGATIVE

## 2016-08-15 NOTE — Anesthesia Postprocedure Evaluation (Signed)
Anesthesia Post Note  Patient: Mario Carpenter  Procedure(s) Performed: Procedure(s) (LRB): LEFT TOTAL HIP ARTHROPLASTY ANTERIOR APPROACH (Left)  Patient location during evaluation: PACU Anesthesia Type: Spinal Level of consciousness: oriented and awake and alert Pain management: pain level controlled Vital Signs Assessment: post-procedure vital signs reviewed and stable Respiratory status: spontaneous breathing, respiratory function stable and patient connected to nasal cannula oxygen Cardiovascular status: blood pressure returned to baseline and stable Postop Assessment: no headache and no backache Anesthetic complications: no    Last Vitals:  Vitals:   08/13/16 0928 08/13/16 1352  BP: (!) 160/90 (!) 174/81  Pulse: (!) 56 86  Resp: 18   Temp: 36.7 C 37 C    Last Pain:  Vitals:   08/13/16 1352  TempSrc: Oral  PainSc:                  [name]

## 2016-09-07 NOTE — Discharge Summary (Signed)
Physician Discharge Summary   Patient ID: AVAN GULLETT MRN: [id number] DOB/AGE: 61/12/1955 61 y.o.  Admit date: 08/12/2016 Discharge date: 08/13/2016  Primary Diagnosis:  Osteonecrosis of the Left hip.  Admission Diagnoses:  Past Medical History:  Diagnosis Date  . Alcoholism ([place])    Resolved  . Arthritis   . Foot pain, right   . History of bronchitis   . History of chicken pox   . Hypertension   . Nocturia   . Psoriasis   . Urinary frequency    Discharge Diagnoses:   Principal Problem:   Avascular necrosis of bone of left hip (HCC) Active Problems:   OA (osteoarthritis) of hip  Estimated body mass index is 31.46 kg/m as calculated from the following:   Height as of this encounter: _0  (1.88 m).   Weight as of this encounter: 111.1 kg (245 lb).  Procedure(s) (LRB): LEFT TOTAL HIP ARTHROPLASTY ANTERIOR APPROACH (Left)   Consults: None  HPI: Mario Carpenter is a 61 y.o. male who has advanced end-  stage osteonecrosis of their Left  hip with progressively worsening pain and  dysfunction.The patient has failed nonoperative management and presents for  total hip arthroplasty.  Laboratory Data: Admission on 08/12/2016, Discharged on 08/13/2016  Component Date Value Ref Range Status  . WBC 08/13/2016 12.7* 4.0 - 10.5 K/uL Final  . RBC 08/13/2016 4.39  4.22 - 5.81 MIL/uL Final  . Hemoglobin 08/13/2016 13.0  13.0 - 17.0 g/dL Final  . HCT 08/13/2016 38.3* 39.0 - 52.0 % Final  . MCV 08/13/2016 87.2  78.0 - 100.0 fL Final  . MCH 08/13/2016 29.6  26.0 - 34.0 pg Final  . MCHC 08/13/2016 33.9  30.0 - 36.0 g/dL Final  . RDW 08/13/2016 12.4  11.5 - 15.5 % Final  . Platelets 08/13/2016 294  150 - 400 K/uL Final  . Sodium 08/13/2016 135  135 - 145 mmol/L Final  . Potassium 08/13/2016 4.4  3.5 - 5.1 mmol/L Final  . Chloride 08/13/2016 104  101 - 111 mmol/L Final  . CO2 08/13/2016 24  22 - 32 mmol/L Final  . Glucose, Bld 08/13/2016 171* 65 - 99 mg/dL Final  .  BUN 08/13/2016 14  6 - 20 mg/dL Final  . Creatinine, Ser 08/13/2016 0.91  0.61 - 1.24 mg/dL Final  . Calcium 08/13/2016 8.7* 8.9 - 10.3 mg/dL Final  . GFR calc non Af Amer 08/13/2016 >60  >60 mL/min Final  . GFR calc Af Amer 08/13/2016 >60  >60 mL/min Final   Comment: (NOTE) The eGFR has been calculated using the CKD EPI equation. This calculation has not been validated in all clinical situations. eGFR's persistently <60 mL/min signify possible Chronic Kidney Disease.   Georgiann Hahn gap 08/13/2016 7  5 - 15 Final  Hospital Outpatient Visit on 08/04/2016  Component Date Value Ref Range Status  . Sodium 08/04/2016 137  135 - 145 mmol/L Final  . Potassium 08/04/2016 4.6  3.5 - 5.1 mmol/L Final  . Chloride 08/04/2016 105  101 - 111 mmol/L Final  . CO2 08/04/2016 28  22 - 32 mmol/L Final  . Glucose, Bld 08/04/2016 85  65 - 99 mg/dL Final  . BUN 08/04/2016 15  6 - 20 mg/dL Final  . Creatinine, Ser 08/04/2016 1.06  0.61 - 1.24 mg/dL Final  . Calcium 08/04/2016 9.2  8.9 - 10.3 mg/dL Final  . Total Protein 08/04/2016 7.5  6.5 - 8.1 g/dL Final  . Albumin 08/04/2016 4.3  3.5 -  5.0 g/dL Final  . AST 08/04/2016 35  15 - 41 U/L Final  . ALT 08/04/2016 47  17 - 63 U/L Final  . Alkaline Phosphatase 08/04/2016 71  38 - 126 U/L Final  . Total Bilirubin 08/04/2016 1.0  0.3 - 1.2 mg/dL Final  . GFR calc non Af Amer 08/04/2016 >60  >60 mL/min Final  . GFR calc Af Amer 08/04/2016 >60  >60 mL/min Final   Comment: (NOTE) The eGFR has been calculated using the CKD EPI equation. This calculation has not been validated in all clinical situations. eGFR's persistently <60 mL/min signify possible Chronic Kidney Disease.   . Anion gap 08/04/2016 4* 5 - 15 Final  . ABO/RH(D) 08/15/2016 O POS   Final  . Antibody Screen 08/15/2016 NEG   Final  . Sample Expiration 08/15/2016 08/15/2016   Final  . Extend sample reason 08/15/2016 NO TRANSFUSIONS OR PREGNANCY IN THE PAST 3 MONTHS   Final  . MRSA, PCR 08/04/2016  NEGATIVE  NEGATIVE Final  . Staphylococcus aureus 08/04/2016 NEGATIVE  NEGATIVE Final   Comment:        The Xpert SA Assay (FDA approved for NASAL specimens in patients over 41 years of age), is one component of a comprehensive surveillance program.  Test performance has been validated by [facility] for patients greater than or equal to 7 year old. It is not intended to diagnose infection nor to guide or monitor treatment.   . ABO/RH(D) 08/04/2016 O POS   Final  Office Visit on 07/27/2016  Component Date Value Ref Range Status  . Rapid Strep A Screen 07/27/2016 Negative  Negative Final  . Organism ID, Bacteria 07/29/2016    Final                   Value:Normal Upper Respiratory Flora No Beta Hemolytic Streptococci Isolated   Office Visit on 07/22/2016  Component Date Value Ref Range Status  . Sodium 07/22/2016 138  135 - 145 mEq/L Final  . Potassium 07/22/2016 4.6  3.5 - 5.1 mEq/L Final  . Chloride 07/22/2016 103  96 - 112 mEq/L Final  . CO2 07/22/2016 29  19 - 32 mEq/L Final  . Glucose, Bld 07/22/2016 78  70 - 99 mg/dL Final  . BUN 07/22/2016 14  6 - 23 mg/dL Final  . Creatinine, Ser 07/22/2016 1.01  0.40 - 1.50 mg/dL Final  . Calcium 07/22/2016 9.7  8.4 - 10.5 mg/dL Final  . GFR 07/22/2016 79.94  >60.00 mL/min Final  . WBC 07/22/2016 6.9  4.0 - 10.5 K/uL Final  . RBC 07/22/2016 4.94  4.22 - 5.81 Mil/uL Final  . Hemoglobin 07/22/2016 14.5  13.0 - 17.0 g/dL Final  . HCT 07/22/2016 42.8  39.0 - 52.0 % Final  . MCV 07/22/2016 86.8  78.0 - 100.0 fl Final  . MCHC 07/22/2016 33.8  30.0 - 36.0 g/dL Final  . RDW 07/22/2016 12.8  11.5 - 15.5 % Final  . Platelets 07/22/2016 284.0  150.0 - 400.0 K/uL Final  . Neutrophils Relative % 07/22/2016 57.2  43.0 - 77.0 % Final  . Lymphocytes Relative 07/22/2016 27.7  12.0 - 46.0 % Final  . Monocytes Relative 07/22/2016 11.4  3.0 - 12.0 % Final  . Eosinophils Relative 07/22/2016 2.8  0.0 - 5.0 % Final  . Basophils Relative 07/22/2016 0.9   0.0 - 3.0 % Final  . Neutro Abs 07/22/2016 3.9  1.4 - 7.7 K/uL Final  . Lymphs Abs 07/22/2016 1.9  0.7 -  4.0 K/uL Final  . Monocytes Absolute 07/22/2016 0.8  0.1 - 1.0 K/uL Final  . Eosinophils Absolute 07/22/2016 0.2  0.0 - 0.7 K/uL Final  . Basophils Absolute 07/22/2016 0.1  0.0 - 0.1 K/uL Final  . Total Bilirubin 07/22/2016 0.9  0.2 - 1.2 mg/dL Final  . Bilirubin, Direct 07/22/2016 0.1  0.0 - 0.3 mg/dL Final  . Alkaline Phosphatase 07/22/2016 55  39 - 117 U/L Final  . AST 07/22/2016 19  0 - 37 U/L Final  . ALT 07/22/2016 20  0 - 53 U/L Final  . Total Protein 07/22/2016 7.0  6.0 - 8.3 g/dL Final  . Albumin 07/22/2016 4.5  3.5 - 5.2 g/dL Final  . INR 07/22/2016 1.0  0.8 - 1.0 ratio Final  . Prothrombin Time 07/22/2016 10.6  9.6 - 13.1 sec Final  . aPTT 07/22/2016 30.9  23.4 - 32.7 SEC Final  . Color, Urine 07/28/2016 YELLOW  YELLOW Final   Comment: The preferred specimen for urinalysis testing is the [employer] urine collection tube. Effective 05/28/2016, [employer], a [employer], will reject any urine cup received that is not transferred into the preferred [employer] tube. Using this device, specimen components remain stable in transit up to 72 hours at ambient temperatures. Urine may be collected in a clean, unused cup and transferred to the yellow cap transfer tube. Supply order number is: [id number]. If you have any questions, please contact your [facility] Account Representative directly, or call our Customer Service Department at [phone].   Marland Kitchen APPearance 07/28/2016 CLEAR  CLEAR Final  . Specific Gravity, Urine 07/28/2016 1.010  1.001 - 1.035 Final  . pH 07/28/2016 7.0  5.0 - 8.0 Final  . Glucose, UA 07/28/2016 NEGATIVE  NEGATIVE Final  . Bilirubin Urine 07/28/2016 NEGATIVE  NEGATIVE Final  . Ketones, ur 07/28/2016 NEGATIVE  NEGATIVE Final  . Hgb urine dipstick 07/28/2016 NEGATIVE  NEGATIVE Final  . Protein, ur 07/28/2016 NEGATIVE   NEGATIVE Final  . Nitrite 07/28/2016 NEGATIVE  NEGATIVE Final  . Leukocytes, UA 07/28/2016 NEGATIVE  NEGATIVE Final  . Organism ID, Bacteria 07/29/2016 NO GROWTH   Final  Office Visit on 06/24/2016  Component Date Value Ref Range Status  . Sodium 06/24/2016 140  135 - 145 mEq/L Final  . Potassium 06/24/2016 4.5  3.5 - 5.1 mEq/L Final  . Chloride 06/24/2016 104  96 - 112 mEq/L Final  . CO2 06/24/2016 30  19 - 32 mEq/L Final  . Glucose, Bld 06/24/2016 92  70 - 99 mg/dL Final  . BUN 06/24/2016 18  6 - 23 mg/dL Final  . Creatinine, Ser 06/24/2016 1.02  0.40 - 1.50 mg/dL Final  . Calcium 06/24/2016 9.9  8.4 - 10.5 mg/dL Final  . GFR 06/24/2016 79.06  >60.00 mL/min Final     X-Rays:Dg Pelvis Portable  Result Date: 08/12/2016 CLINICAL DATA:  Postop left hip replacement. EXAM: PORTABLE PELVIS 1-2 VIEWS COMPARISON:  09/22/2014 FINDINGS: Examination demonstrates a left total hip arthroplasty intact and normally located. Surgical drain present over the superolateral soft tissues of the left hip. Mild to moderate degenerative change of the right hip. IMPRESSION: Evidence of patient's recent left total hip arthroplasty without complicating features. Electronically Signed   By: Marin Olp M.D.   On: 08/12/2016 17:18   Dg C-arm 61-120 Min-no Report  Result Date: 08/12/2016 There is no Radiologist interpretation  for this exam.   EKG: Orders placed or performed in visit on 07/22/16  . EKG 12-Lead  Hospital Course: Patient was admitted to [facility] and taken to the OR and underwent the above state procedure without complications.  Patient tolerated the procedure well and was later transferred to the recovery room and then to the orthopaedic floor for postoperative care.  They were given PO and IV analgesics for pain control following their surgery.  They were given 24 hours of postoperative antibiotics of  Anti-infectives    Start     Dose/Rate Route Frequency Ordered Stop    08/12/16 2200  ceFAZolin (ANCEF) IVPB 2g/100 mL premix     2 g 200 mL/hr over 30 Minutes Intravenous Every 6 hours 08/12/16 2037 08/13/16 0429   08/12/16 1235  ceFAZolin (ANCEF) IVPB 2g/100 mL premix     2 g 200 mL/hr over 30 Minutes Intravenous 30 min pre-op 08/12/16 1229 08/12/16 1515     and started on DVT prophylaxis in the form of Xarelto.   PT and OT were ordered for total hip protocol.  The patient was allowed to be WBAT with therapy. Discharge planning was consulted to help with postop disposition and equipment needs.  Patient had a good night on the evening of surgery.  They started to get up OOB with therapy on day one.  Hemovac drain was pulled without difficulty.   Patient was seen in rounds and was ready to go home later that same day after therapy.  Diet: Regular diet Activity:WBAT Follow-up:in 2 weeks Disposition - Home Discharged Condition: good    Allergies as of 08/13/2016   No Known Allergies     Medication List    STOP taking these medications   amoxicillin-clavulanate 875-125 MG tablet Commonly known as:  AUGMENTIN   aspirin EC 81 MG tablet   meloxicam 7.5 MG tablet Commonly known as:  MOBIC     TAKE these medications   amLODipine 5 MG tablet Commonly known as:  NORVASC Take 1 tablet (5 mg total) by mouth daily.   betamethasone dipropionate 0.05 % ointment Commonly known as:  DIPROLENE Apply 1 application topically 2 (two) times daily as needed (psoriasis).   calcipotriene 0.005 % cream Commonly known as:  DOVONOX Apply 1 application topically 2 (two) times daily as needed (psoriasis).   methocarbamol 500 MG tablet Commonly known as:  ROBAXIN Take 1 tablet (500 mg total) by mouth every 6 (six) hours as needed for muscle spasms.   oxyCODONE 5 MG immediate release tablet Commonly known as:  Oxy IR/ROXICODONE Take 1-2 tablets (5-10 mg total) by mouth every 3 (three) hours as needed for breakthrough pain.   rivaroxaban 10 MG Tabs tablet Commonly  known as:  XARELTO Take 1 tablet (10 mg total) by mouth daily with breakfast.   sildenafil 100 MG tablet Commonly known as:  VIAGRA Take 1 tablet (100 mg total) by mouth daily as needed for erectile dysfunction.   tamsulosin 0.4 MG Caps capsule Commonly known as:  FLOMAX Take 1 capsule (0.4 mg total) by mouth daily.   traMADol 50 MG tablet Commonly known as:  ULTRAM Take 1-2 tablets (50-100 mg total) by mouth every 6 (six) hours as needed for moderate pain.      Follow-up Information    [name], MD. Schedule an appointment as soon as possible for a visit on 08/25/2016.   Specialty:  Orthopedic Surgery Why:  Call [phone] Monday to make the appointment Contact information: [address] [place] [address] [phone]           Signed:  Arlee Muslim, PA-C Orthopaedic Surgery 09/07/2016, 9:34 PM

## 2016-09-22 DIAGNOSIS — Z471 Aftercare following joint replacement surgery: Secondary | ICD-10-CM | POA: Diagnosis not present

## 2016-09-22 DIAGNOSIS — Z96642 Presence of left artificial hip joint: Secondary | ICD-10-CM | POA: Diagnosis not present

## 2016-10-13 DIAGNOSIS — N401 Enlarged prostate with lower urinary tract symptoms: Secondary | ICD-10-CM | POA: Diagnosis not present

## 2016-10-20 DIAGNOSIS — M5137 Other intervertebral disc degeneration, lumbosacral region: Secondary | ICD-10-CM | POA: Diagnosis not present

## 2016-10-30 ENCOUNTER — Other Ambulatory Visit: Payer: Self-pay | Admitting: Family

## 2016-10-31 NOTE — Telephone Encounter (Signed)
Last OV:  07/2016 acute visit Next OV:  01/2017 Amlodipine refill sent. Pt also requesting 90 days of meloxicam.  Please advise.

## 2016-11-02 ENCOUNTER — Encounter: Payer: Self-pay | Admitting: *Deleted

## 2016-11-02 MED ORDER — MELOXICAM 7.5 MG PO TABS: 7.5000 mg | ORAL_TABLET | Freq: Every day | ORAL | 0 refills | Status: DC

## 2016-11-02 NOTE — Telephone Encounter (Signed)
Opened in error

## 2016-11-02 NOTE — Telephone Encounter (Signed)
He cannot take meloxicam with xarelto. I recommend tylenol instead.

## 2016-11-02 NOTE — Telephone Encounter (Signed)
Left message on pt's voicemail to check mychart acct. Message sent.

## 2016-11-02 NOTE — Telephone Encounter (Signed)
Meloxicam was d/c'd 08/2016 with note to stop taking at discharge.  Please advise meloxicam request?

## 2016-11-04 ENCOUNTER — Telehealth: Payer: Self-pay | Admitting: Family

## 2016-11-04 ENCOUNTER — Encounter: Payer: Self-pay | Admitting: Nurse Practitioner

## 2016-11-04 DIAGNOSIS — Z1211 Encounter for screening for malignant neoplasm of colon: Secondary | ICD-10-CM

## 2016-11-04 NOTE — Telephone Encounter (Signed)
Patient is due for colonoscopy.  Referral placed.

## 2016-11-04 NOTE — Telephone Encounter (Signed)
Patient sent My Chart message stating it is time for him to have a Colonoscopy. Plse adv

## 2016-11-18 IMAGING — CR DG HIP (WITH OR WITHOUT PELVIS) 2-3V*L*
3 series · 3 of 3 positions shown · non-contrast
Comparison: None.

CLINICAL DATA: Left hip pain for 6 months. Worsening after a
massage. Otherwise, no known injury.

EXAM:
DG HIP W/ PELVIS 2-3V*L*

[w cross table hip left *]
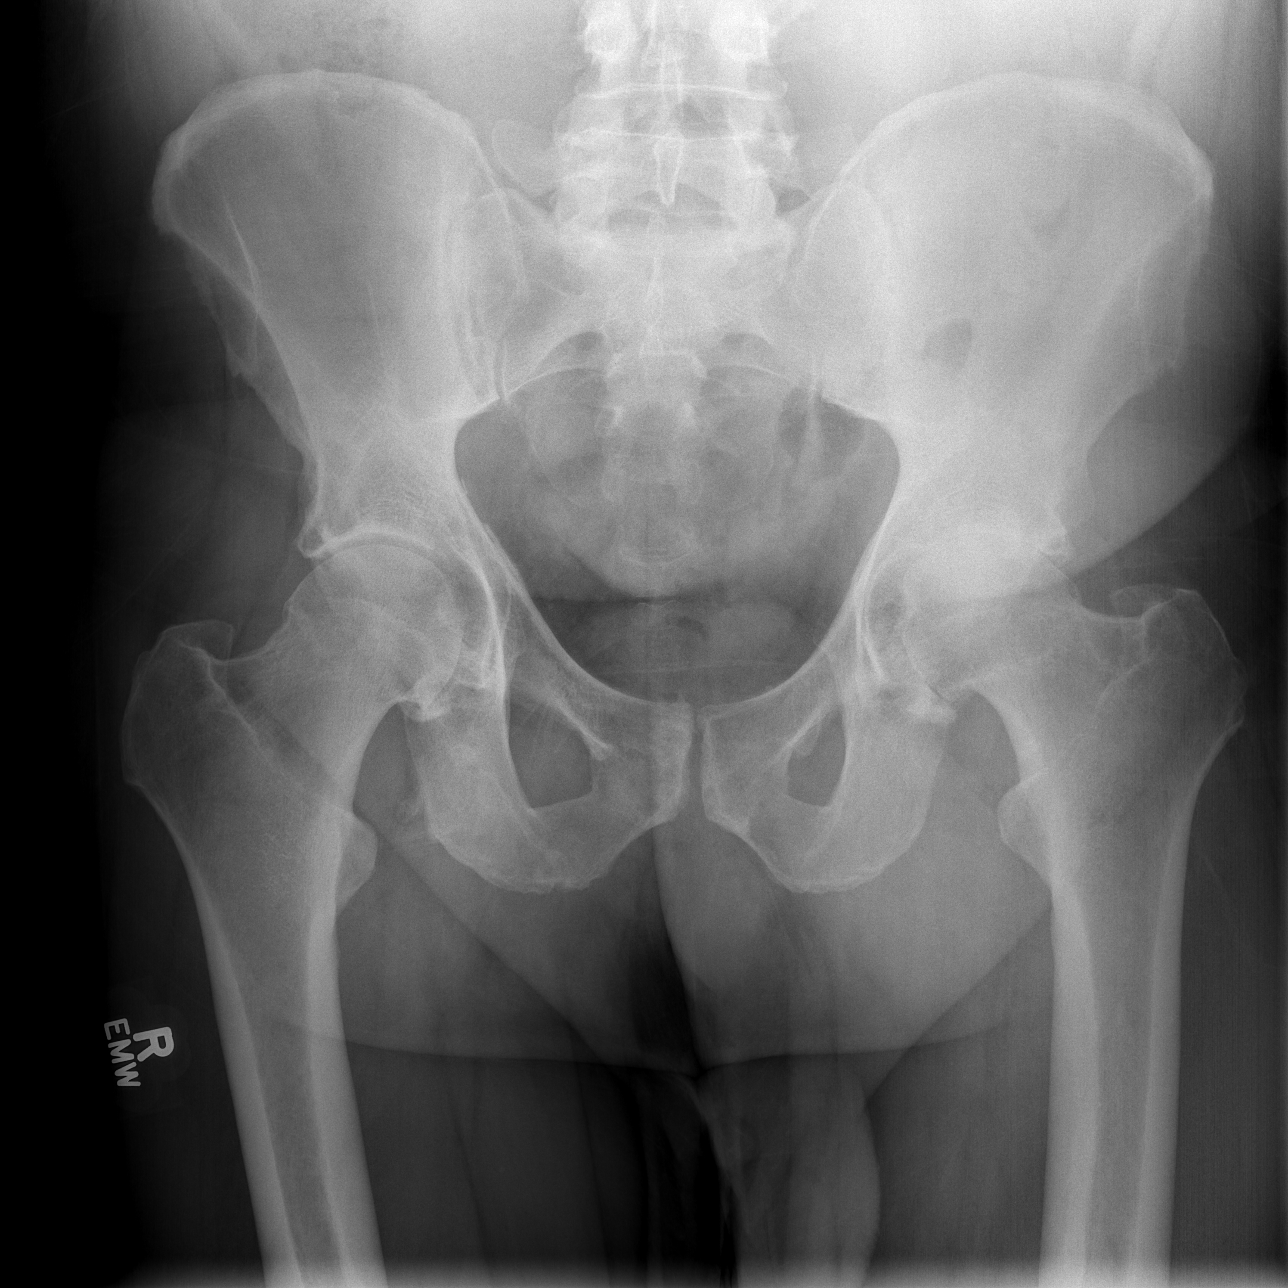

[w cross table hip left]
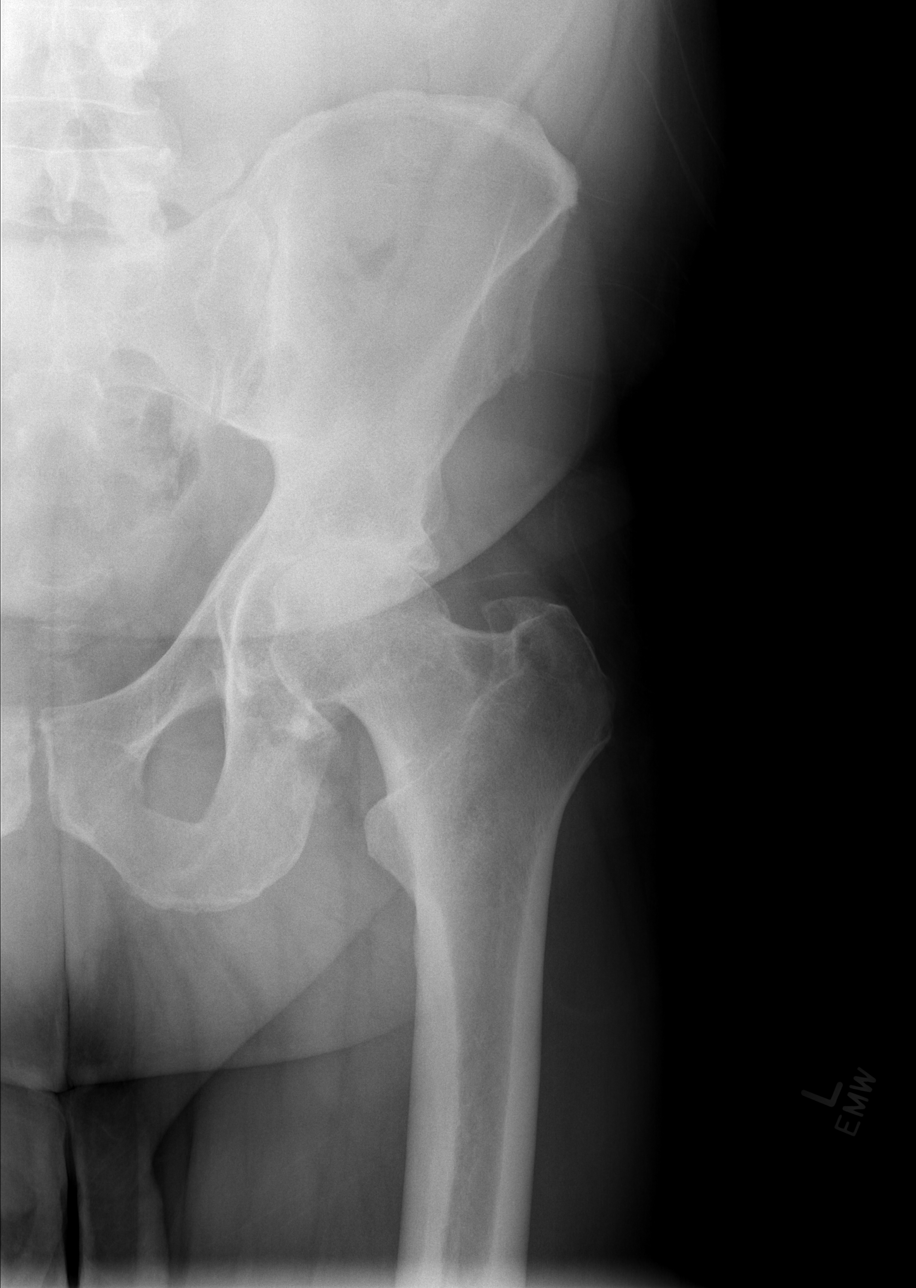

[t pelvis a.p.]
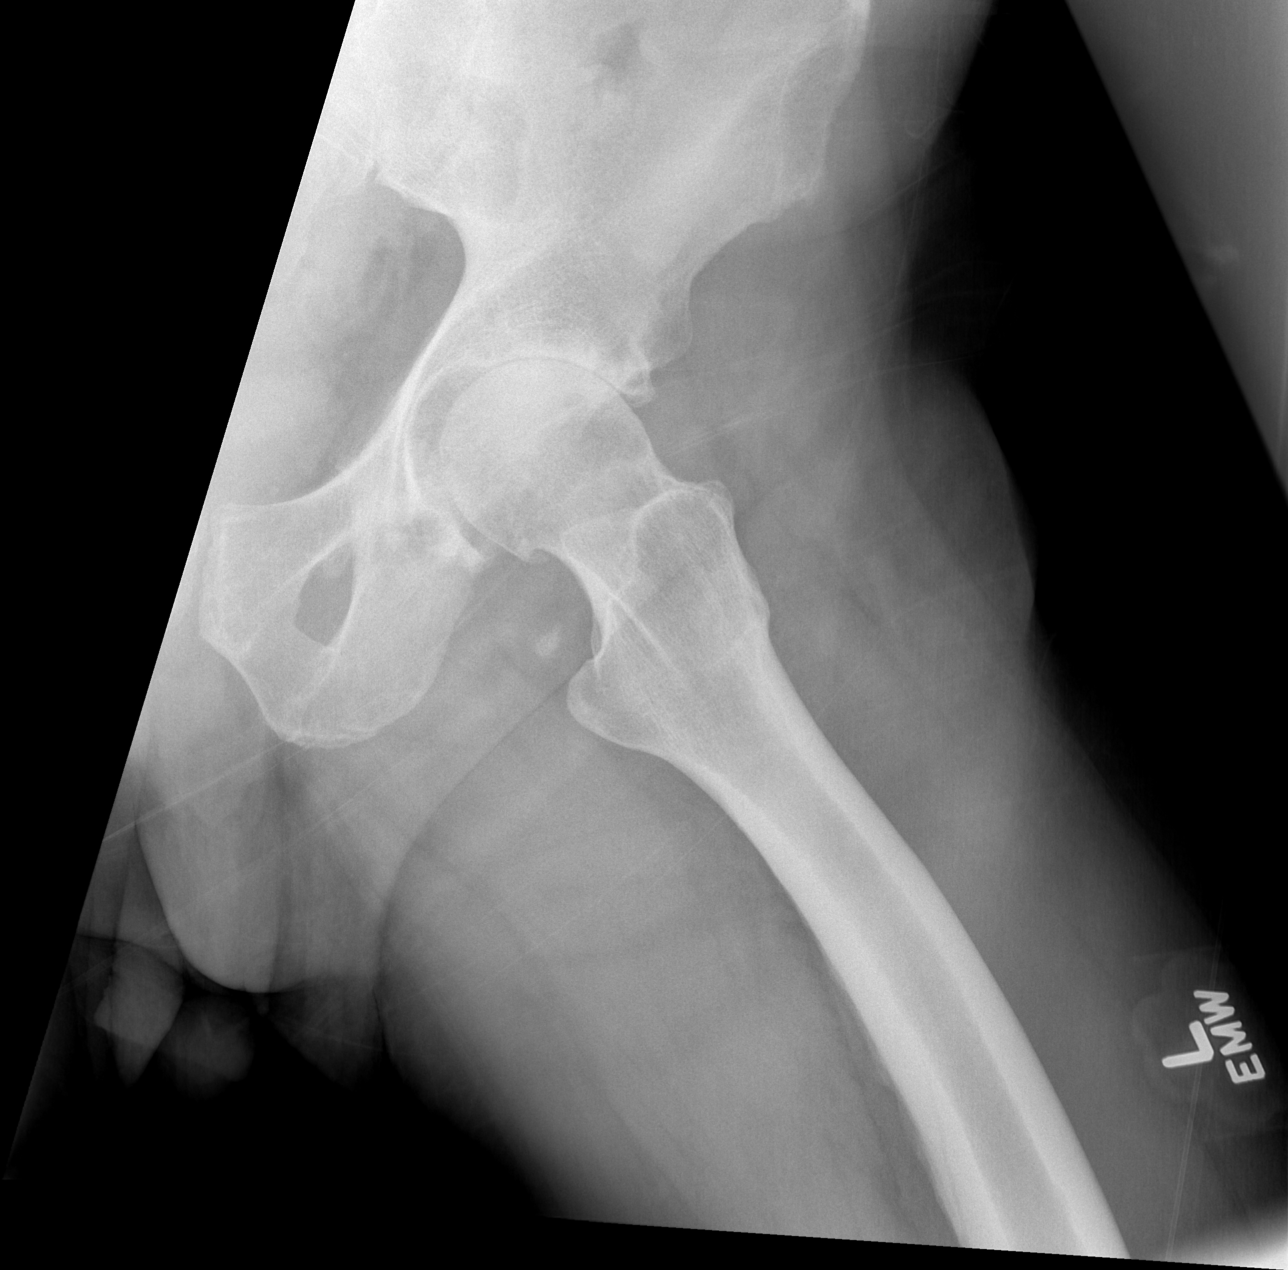

[3 of 3 positions shown; findings below may reference images not displayed]

FINDINGS: No fracture or dislocation.

Severe degenerative change of the left hip with near complete joint
space loss and bone-on-bone articulation of the femoral head and the
acetabulum. No definite articular surface irregularity or evidence
of avascular necrosis. Subchondral sclerosis and osteophytosis.
Query small donor site about the anterior inferior aspect of the
left hip joint space though a discrete donor site is not identified.

Limited visualization of the contralateral right hip suggests very
mild degenerative change.

No radiopaque foreign body.
IMPRESSION: 1. No definite acute findings.
2. Severe degenerative change of the left hip.

## 2016-11-24 ENCOUNTER — Encounter: Payer: Self-pay | Admitting: Nurse Practitioner

## 2016-11-24 ENCOUNTER — Ambulatory Visit (INDEPENDENT_AMBULATORY_CARE_PROVIDER_SITE_OTHER): Payer: Commercial Managed Care - PPO | Admitting: Nurse Practitioner

## 2016-11-24 VITALS — BP 144/80 | Ht 74.0 in | Wt 251.0 lb

## 2016-11-24 DIAGNOSIS — Z1211 Encounter for screening for malignant neoplasm of colon: Secondary | ICD-10-CM

## 2016-11-24 DIAGNOSIS — K5900 Constipation, unspecified: Secondary | ICD-10-CM | POA: Diagnosis not present

## 2016-11-24 MED ORDER — NA SULFATE-K SULFATE-MG SULF 17.5-3.13-1.6 GM/177ML PO SOLN: ORAL | 0 refills | Status: DC

## 2016-11-24 NOTE — Patient Instructions (Signed)
You have been scheduled for a colonoscopy. Please follow written instructions given to you at your visit today.  Please pick up your prep supplies at the pharmacy within the next 1-3 days. If you use inhalers (even only as needed), please bring them with you on the day of your procedure. Your physician has requested that you go to www.[email or web address].com and enter the access code given to you at your visit today. This web site gives a general overview about your procedure. However, you should still follow specific instructions given to you by our office regarding your preparation for the procedure.  Please purchase the following medications over the counter and take as directed: Citrucel daily  If you are age 32 or older, your body mass index should be between 23-30. Your Body mass index is 32.23 kg/m. If this is out of the aforementioned range listed, please consider follow up with your Primary Care Provider.  If you are age 23 or younger, your body mass index should be between 19-25. Your Body mass index is 32.23 kg/m. If this is out of the aformentioned range listed, please consider follow up with your Primary Care Provider.

## 2016-11-24 NOTE — Progress Notes (Addendum)
Addendum 12/20/16 Records from [facility]:  Screening colonoscopy July 2007 by Dr. Michail Sermon. Extent to cecum, colon prep was adequate. A patchy area of mildly nodular mucosa was found in the ileocecal valve. Biopsies taken. 2 of the rectum was normal. Ileocecal valve biopsies benign, both fragments demonstrated prominent submucosal mature adipose tissue. Adenomatous change.      HPI:  Patient is a 61 year old male, new to this practice, referred by PCP [name], NP for colon cancer screening. He had an colonoscopy with [facility] 10+ years ago. He has no bowel changes, no blood in stool. No unexpected weight loss. He has occasional constipation, especially when traveling. He admits that his diet is not great and bowel habits depend on what he eats. He has occasional minor perianal discomfort when constipated. He uses MiraLAX as needed.   Past Medical History:  Diagnosis Date  . Alcoholism ([place])    Resolved  . Arthritis   . Foot pain, right   . History of bronchitis   . History of chicken pox   . Hypertension   . Nocturia   . Psoriasis   . Urinary frequency     Past Surgical History:  Procedure Laterality Date  . APPENDECTOMY    . HERNIA REPAIR    . KNEE SURGERY     right times 2  . 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 WISDOM TOOTH EXTRACTION     Family History  Problem Relation Age of Onset  . Heart disease Mother 15    Deceased  . Heart defect Mother   . Diabetes Son     type 1  . Diabetes Father 68    Deceased  . Healthy Brother     x1  . Healthy Sister     x2   Social History  Substance Use Topics  . Smoking status: Former Smoker    Packs/day: 2.00    Years: 25.00    Types: Cigarettes  . Smokeless tobacco: Never Used  . Alcohol use No     Comment: hx of alcoholism; quit 15 years ago    Current  Outpatient Prescriptions  Medication Sig Dispense Refill  . amLODipine (NORVASC) 5 MG tablet TAKE 1 TABLET BY MOUTH  DAILY 90 tablet 1  . betamethasone dipropionate (DIPROLENE) 0.05 % ointment Apply 1 application topically 2 (two) times daily as needed (psoriasis). 1 g 0  . calcipotriene (DOVONOX) 0.005 % cream Apply 1 application topically 2 (two) times daily as needed (psoriasis). 1 g 0  . meloxicam (MOBIC) 7.5 MG tablet Take 1 tablet (7.5 mg total) by mouth daily. 90 tablet 0  . sildenafil (VIAGRA) 100 MG tablet Take 1 tablet (100 mg total) by mouth daily as needed for erectile dysfunction. 5 tablet 11  . tamsulosin (FLOMAX) 0.4 MG CAPS capsule Take 1 capsule (0.4 mg total) by mouth daily. 90 capsule 1  . TURMERIC PO Take 1,000 mg by mouth daily.     No current facility-administered medications for this visit.    No Known Allergies   Review of Systems: All systems reviewed and negative except where noted in HPI.    Physical Exam: BP (!) 144/80   Ht 6\' 2"  (1.88 m)   Wt 251 lb (113.9 kg)   BMI 32.23 kg/m  Constitutional:  Well-developed, white male male in no acute distress. Psychiatric: Normal mood  and affect. Behavior is normal. HEENT: Normocephalic and atraumatic. Conjunctivae are normal. No scleral icterus. Neck supple.  Cardiovascular: Normal rate, regular rhythm.  Pulmonary/chest: Effort normal and breath sounds normal. No wheezing, rales or rhonchi. Abdominal: Soft, nondistended, nontender. Bowel sounds active throughout. There are no masses palpable. No hepatomegaly. Extremities: no edema Lymphadenopathy: No cervical adenopathy noted. Neurological: Alert and oriented to person place and time. Skin: Skin is warm and dry. No rashes noted.   ASSESSMENT AND PLAN:  1. Colon cancer screening. Patient will be scheduled for a screening colonoscopy with possible polypectomy.  The risks and benefits of the procedure were discussed and the patient agrees to proceed.   2.  Occasional constipation, especially when travelling. Takes Miralax as needed. Admits to diet not being that great at times.  -daily Citrucel, Miralax as needed  3. Minor perianal discomfort with BMs, especially when constipated. He wants to defer rectal exam until colonoscopy.    Tye Savoy, NP  11/24/2016, 8:52 AM  Cc: [name], NP

## 2016-11-29 NOTE — Progress Notes (Signed)
Reviewed and agree with documentation and assessment and plan. K. Veena Brynne Doane , MD   

## 2016-12-01 ENCOUNTER — Encounter: Payer: Self-pay | Admitting: Medical

## 2016-12-01 ENCOUNTER — Ambulatory Visit (INDEPENDENT_AMBULATORY_CARE_PROVIDER_SITE_OTHER): Payer: Commercial Managed Care - PPO | Admitting: Medical

## 2016-12-01 VITALS — BP 133/69 | HR 77 | Temp 98.0°F | Resp 16 | Ht 74.0 in | Wt 249.0 lb

## 2016-12-01 DIAGNOSIS — D179 Benign lipomatous neoplasm, unspecified: Secondary | ICD-10-CM

## 2016-12-01 DIAGNOSIS — R21 Rash and other nonspecific skin eruption: Secondary | ICD-10-CM | POA: Diagnosis not present

## 2016-12-01 DIAGNOSIS — L739 Follicular disorder, unspecified: Secondary | ICD-10-CM

## 2016-12-01 DIAGNOSIS — B001 Herpesviral vesicular dermatitis: Secondary | ICD-10-CM

## 2016-12-01 MED ORDER — DOXYCYCLINE HYCLATE 100 MG PO TABS: 100.0000 mg | ORAL_TABLET | Freq: Two times a day (BID) | ORAL | 0 refills | Status: DC

## 2016-12-01 MED ORDER — FAMCICLOVIR 500 MG PO TABS: 500.0000 mg | ORAL_TABLET | Freq: Three times a day (TID) | ORAL | 0 refills | Status: DC

## 2016-12-01 NOTE — Progress Notes (Signed)
Pre visit review using our clinic review tool, if applicable. No additional management support is needed unless otherwise documented below in the visit note/SLS  

## 2016-12-01 NOTE — Patient Instructions (Addendum)
For skin follicles inflammed rx doxycycline antibiotic.  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 Rx famvir for hsv future eruption.  Educated on lipoma. If enlarges can refer to surgeon   Follow up 7-10 days or as needed  Counseled pt on his last complaint of rt sided chest pain one week ago. Advise if reoccurs let us know. If he were to get this again with cardiac type signs or symptoms then ED evaluation. He agreed. He expressed no concern for heart and indicated did not want work up.  I though pain may have been muscular or costochondritis type pain.

## 2016-12-01 NOTE — Progress Notes (Signed)
Subjective:    Patient ID: Mario Carpenter, male    DOB: 1956/05/25, 61 y.o.   MRN: [id number]  HPI  Pt reports small lump on his left upper lip the other day/last week. Pt has hx of fever blister in past. He took wife valtrex on past Monday and for several days. Lip now feels normal.  Also pt reports 2 small bumps on his head just recently. Itches a little bit on scalp. He took benadryl 2 days ago for small bump in scalp but did not help.   Small lump rt side hip/anterior area felt last couple of days not pain. On review dad had large back lipoma.  Occassional small bump on his penis last week. Lasted only one day. Now resolved. Pt declined exam today stating area completely normal now. He did mention that years ago provider mentioned blood test for herpes was positive.  Also mentioned rt side faint chest discomfort. Lateral to rt costochondral junction. No associated cardiac type signs or symptoms. Pt ekg in 07-2016 nsr. Pt doubts cardiac. No problems breathing when this occurred. Chest xray 2017 showed clear lung fields.    Review of Systems  Constitutional: Negative for chills and fatigue.  HENT: Negative for dental problem.   Respiratory: Negative for cough, chest tightness, shortness of breath and wheezing.   Cardiovascular: Negative for chest pain and palpitations.  Gastrointestinal: Negative for abdominal distention, anal bleeding, constipation, diarrhea and nausea.  Musculoskeletal:       Probable pectoralis/costochondral pain past week. See hip. No symptoms for one week.  Skin: Negative for rash.       See hp. Regarding lip, scalp and hip.  Neurological: Negative for dizziness, seizures, light-headedness and numbness.  Hematological: Negative for adenopathy. Does not bruise/bleed easily.  Psychiatric/Behavioral: Negative for behavioral problems, confusion, sleep disturbance and suicidal ideas.    Past Medical History:  Diagnosis Date  . Alcoholism ([place])    Resolved  . Arthritis   . Foot pain, right   . History of bronchitis   . History of chicken pox   . Hypertension   . Nocturia   . Psoriasis   . Urinary frequency      Social History   Social History  . Marital status: Married    Spouse name: N/A  . Number of children: N/A  . Years of education: N/A   Occupational History  . Not on file.   Social History Main Topics  . Smoking status: Former Smoker    Packs/day: 2.00    Years: 25.00    Types: Cigarettes  . Smokeless tobacco: Never Used  . Alcohol use No     Comment: hx of alcoholism; quit 15 years ago   . Drug use: No     Comment: cocaine,marijuana use in past quit 20 years ago   . Sexual activity: Not on file   Other Topics Concern  . Not on file   Social History Narrative  . No narrative on file    Past Surgical History:  Procedure Laterality Date  . APPENDECTOMY    . HERNIA REPAIR    . KNEE SURGERY     right times 2  . 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 WISDOM TOOTH EXTRACTION      Family History  Problem Relation Age of Onset  . Heart disease Mother 50  Deceased  . Heart defect Mother   . Diabetes Son     type 1  . Diabetes Father 44    Deceased  . Healthy Brother     x1  . Healthy Sister     x2    No Known Allergies  Current Outpatient Prescriptions on File Prior to Visit  Medication Sig Dispense Refill  . amLODipine (NORVASC) 5 MG tablet TAKE 1 TABLET BY MOUTH  DAILY 90 tablet 1  . betamethasone dipropionate (DIPROLENE) 0.05 % ointment Apply 1 application topically 2 (two) times daily as needed (psoriasis). 1 g 0  . calcipotriene (DOVONOX) 0.005 % cream Apply 1 application topically 2 (two) times daily as needed (psoriasis). 1 g 0  . meloxicam (MOBIC) 7.5 MG tablet Take 1 tablet (7.5 mg total) by mouth daily. 90 tablet 0  .  sildenafil (VIAGRA) 100 MG tablet Take 1 tablet (100 mg total) by mouth daily as needed for erectile dysfunction. 5 tablet 11  . tamsulosin (FLOMAX) 0.4 MG CAPS capsule Take 1 capsule (0.4 mg total) by mouth daily. 90 capsule 1  . TURMERIC PO Take 1,000 mg by mouth daily.    . Na Sulfate-K Sulfate-Mg Sulf 17.5-3.13-1.6 GM/180ML SOLN Suprep-Use as directed (Patient not taking: Reported on 12/01/2016) 354 mL 0   No current facility-administered medications on file prior to visit.     BP 133/69 (BP Location: Right Arm, Patient Position: Sitting, Cuff Size: Large)   Pulse 77   Temp 98 F (36.7 C) (Oral)   Resp 16   Ht 6\' 2"  (1.88 m)   Wt 249 lb (112.9 kg)   SpO2 97%   BMI 31.97 kg/m       Objective:   Physical Exam   General Mental Status- Alert. General Appearance- Not in acute distress.   Skin General: Color- Normal Color. Moisture- Normal Moisture. Top of scalp on palpation like 2 small follicles inflamed.   Lip- no cold sore presently.  Neck Carotid Arteries- Normal color. Moisture- Normal Moisture. No carotid bruits. No JVD.  Chest and Lung Exam Auscultation: Breath Sounds:-Normal.  Cardiovascular Auscultation:Rythm- Regular. Murmurs & Other Heart Sounds:Auscultation of the heart reveals- No Murmurs.  Abdomen Inspection:-Inspeection Normal. Palpation/Percussion:Note:No mass. Palpation and Percussion of the abdomen reveal- Non Tender, Non Distended + BS, no rebound or guarding.    Neurologic Cranial Nerve exam:- CN III-XII intact(No nystagmus), symmetric smile. Strength:- 5/5 equal and symmetric strength both upper and lower extremities.  Rt iliac crest area- feel like 1.5 cm oblong shaped lipoma.  Genital exam- differed since he states area normal now.  Anterior thorax- no pain on palpation presently.     Assessment & Plan:  For skin follicles inflammed rx doxycycline antibiotic.  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 Rx famvir for hsv future eruption.  Educated on lipoma. If enlarges can refer to surgeon   Follow up 7-10 days or as needed  Counseled pt on his last complaint of rt sided chest pain one week ago. Advise if reoccurs let us know. If he were to get this again with cardiac type signs or symptoms then ED evaluation. He agreed. He expressed no concern for heart and indicated did not want work up.  I though pain may have been muscular or costochondritis type pain.  [place] Hagarty, Percell Miller, PA-C

## 2016-12-02 LAB — HSV(HERPES SIMPLEX VRS) I + II AB-IGG: HSV 1 GLYCOPROTEIN G AB, IGG: 28.5 {index} — AB (ref <0.90)

## 2016-12-04 LAB — HSV 1/2 AB (IGM), IFA W/RFLX TITER
HSV 1 IgM Screen: NEGATIVE
HSV 2 IGM SCREEN: NEGATIVE

## 2016-12-06 ENCOUNTER — Other Ambulatory Visit: Payer: Self-pay | Admitting: Family

## 2016-12-06 ENCOUNTER — Encounter: Payer: Self-pay | Admitting: *Deleted

## 2016-12-06 NOTE — Progress Notes (Signed)
Letter Mailed/SLS 04/03  

## 2016-12-09 ENCOUNTER — Encounter: Payer: Self-pay | Admitting: Family Medicine

## 2016-12-09 ENCOUNTER — Ambulatory Visit (INDEPENDENT_AMBULATORY_CARE_PROVIDER_SITE_OTHER): Payer: Commercial Managed Care - PPO | Admitting: Family Medicine

## 2016-12-09 VITALS — BP 130/70 | HR 80 | Temp 97.6°F | Ht 74.0 in | Wt 249.4 lb

## 2016-12-09 DIAGNOSIS — L989 Disorder of the skin and subcutaneous tissue, unspecified: Secondary | ICD-10-CM

## 2016-12-09 DIAGNOSIS — R0789 Other chest pain: Secondary | ICD-10-CM | POA: Diagnosis not present

## 2016-12-09 NOTE — Patient Instructions (Addendum)
If you do not hear anything about your referral in the next 1-2 weeks, call our office and ask for an update.  Things that make me think heart related chest pain: central chest pressure, related to physical exertion, shortness of breath, jaw pain/arm pain  Zyrtec may be beneficial if Benadryl makes your drowsy.

## 2016-12-09 NOTE — Progress Notes (Signed)
Pre visit review using our clinic review tool, if applicable. No additional management support is needed unless otherwise documented below in the visit note. 

## 2016-12-09 NOTE — Progress Notes (Signed)
Chief Complaint  Patient presents with  . Follow-up    on skin rash-pt states he is not any better  . Chest Pain    (R) side-2 weeks (on and off)    Mario Carpenter is a 61 y.o. male here for evaluation of R sided chest pain.  Duration of issue: 2 weeks Quality: Dull Palliation: Tramadol, Tylenol Provocation: None Severity: 3/10 Radiation: R side of back Duration of chest pain: 2 hours Associated symptoms: None Cardiac history: HTN Family heart history: mom had valve replacement Smoker? No   Skin lesion He has bumps and migrating lesions over past 2-3 weeks. Was seen on 3/29 and treated for folliculitis with Doxy. No changes, keeps migrating. Currently on R thigh and posterior scalp. Painful to touch, no itching, spreading redness or drainage. No new soaps, lotions, topicals or detergents. No sick contacts.  ROS:  Cardiac: No current chest pain Lungs: No SOB  Past Medical History:  Diagnosis Date  . Alcoholism ([place])    Resolved  . Arthritis   . Foot pain, right   . History of bronchitis   . History of chicken pox   . Hypertension   . Nocturia   . Psoriasis   . Urinary frequency    Family History  Problem Relation Age of Onset  . Heart disease Mother 83    Deceased  . Heart defect Mother   . Diabetes Son     type 1  . Diabetes Father 4    Deceased  . Healthy Brother     x1  . Healthy Sister     x2   Social History   Social History  . Marital status: Married    Spouse name: N/A  . Number of children: N/A  . Years of education: N/A   Occupational History  . Not on file.   Social History Main Topics  . Smoking status: Former Smoker    Packs/day: 2.00    Years: 25.00    Types: Cigarettes  . Smokeless tobacco: Never Used  . Alcohol use No     Comment: hx of alcoholism; quit 15 years ago   . Drug use: No     Comment: cocaine,marijuana use in past quit 20 years ago   . Sexual activity: Not on file   Other Topics Concern  . Not on file    Social History Narrative  . No narrative on file   Allergies as of 12/09/2016   No Known Allergies     Medication List       Accurate as of 12/09/16  2:48 PM. Always use your most recent med list.          amLODipine 5 MG tablet Commonly known as:  NORVASC TAKE 1 TABLET BY MOUTH  DAILY   betamethasone dipropionate 0.05 % ointment Commonly known as:  DIPROLENE Apply 1 application topically 2 (two) times daily as needed (psoriasis).   calcipotriene 0.005 % cream Commonly known as:  DOVONOX Apply 1 application topically 2 (two) times daily as needed (psoriasis).   doxycycline 100 MG tablet Commonly known as:  VIBRA-TABS Take 1 tablet (100 mg total) by mouth 2 (two) times daily. Can give caps or generic   famciclovir 500 MG tablet Commonly known as:  FAMVIR Take 1 tablet (500 mg total) by mouth 3 (three) times daily.   meloxicam 7.5 MG tablet Commonly known as:  MOBIC Take 1 tablet (7.5 mg total) by mouth daily.   Na Sulfate-K Sulfate-Mg Sulf  17.5-3.13-1.6 GM/180ML Soln Suprep-Use as directed   sildenafil 100 MG tablet Commonly known as:  VIAGRA Take 1 tablet (100 mg total) by mouth daily as needed for erectile dysfunction.   tamsulosin 0.4 MG Caps capsule Commonly known as:  FLOMAX TAKE 1 CAPSULE BY MOUTH  DAILY   TURMERIC PO Take 1,000 mg by mouth daily.      BP 130/70 (BP Location: Left Arm, Patient Position: Sitting, Cuff Size: Large)   Pulse 80   Temp 97.6 F (36.4 C) (Oral)   Ht 6\' 2"  (1.88 m)   Wt 249 lb 6.4 oz (113.1 kg)   SpO2 97%   BMI 32.02 kg/m  Gen: awake, alert, appears stated age HEENT: MMM Heart: RRR, no murmur, no bruits, no LE edema Lungs: CTAB, no access muscle use Skin: On R lateral thigh, there is a slightly raised area of erythema, mildly TTP, no fluctuance, drainage; there are also similar lesions over occipital scalp. No fluctuance or drainage, no scaling. There is also a raised fleshy domed shaped lesion on L posterior forearm.  Central ulceration. 0.9 cm in diameter. MSK: CP is reproducible to palpation over R chest wall.  Psych: Age appropriate judgment and insight  Skin lesion - Plan: Ambulatory referral to Dermatology  Atypical chest pain  Orders as above. Worried about SCC vs BCC on forearm. Second opinion for migrating lesions. Offered biopsy vs empiric tx for allergic condition also. Will try taking Zyrtec. Chest pain is not typical, few risk factors. Will keep an eye on things as this is likely musculoskeletal in etiology. Warning signs/symptoms given which would suggest cardiac cause. F/u prn. The patient voiced understanding and agreement to the plan.  [place], DO 12/09/16 2:48 PM

## 2016-12-14 ENCOUNTER — Encounter: Payer: Commercial Managed Care - PPO | Admitting: Gastroenterology

## 2016-12-15 ENCOUNTER — Other Ambulatory Visit: Payer: Self-pay

## 2016-12-15 ENCOUNTER — Telehealth: Payer: Self-pay

## 2016-12-15 NOTE — Telephone Encounter (Signed)
[facility] called stating his medication for flomax was suppose to be written to take 2times a day. I did not see where the rx had been changed.

## 2016-12-16 NOTE — Telephone Encounter (Signed)
All of our prescriptions have been for once daily. Please continue once daily dosing.

## 2016-12-16 NOTE — Telephone Encounter (Signed)
Spoke with Cecille Rubin at [facility]: once daily dosing for flomax. She reports that she spoke with [facility] yesterday and was advised that once daily was correct dose. They have notified pt that PCP denied request for increase in dose and Rx has already shipped. I will contact pt to see why he is requesting increase in directions?

## 2016-12-16 NOTE — Telephone Encounter (Signed)
Pt stated urologist told him he should take one in the morning and one at night. Pt does not think the medication is working stated that he's going to stop taking medication if it doesn't start working.

## 2016-12-16 NOTE — Telephone Encounter (Signed)
Left message for pt to return my call.

## 2016-12-17 NOTE — Telephone Encounter (Signed)
I would recommend that he follow up with his urologist for the refills since he changed the dose and also to discuss his concerns that the medication is not working.

## 2016-12-19 NOTE — Telephone Encounter (Signed)
Notified pt of below. He reports that he has spoken with urologist and he has recommended some other options and he will let us know if medication changes in the future.

## 2016-12-28 ENCOUNTER — Other Ambulatory Visit: Payer: Self-pay | Admitting: Family

## 2016-12-28 MED ORDER — SILDENAFIL CITRATE 100 MG PO TABS: 100.0000 mg | ORAL_TABLET | Freq: Every day | ORAL | 5 refills | Status: DC | PRN

## 2016-12-29 DIAGNOSIS — L309 Dermatitis, unspecified: Secondary | ICD-10-CM | POA: Diagnosis not present

## 2016-12-29 DIAGNOSIS — L281 Prurigo nodularis: Secondary | ICD-10-CM | POA: Diagnosis not present

## 2016-12-29 DIAGNOSIS — L821 Other seborrheic keratosis: Secondary | ICD-10-CM | POA: Diagnosis not present

## 2016-12-29 DIAGNOSIS — D1800 Hemangioma unspecified site: Secondary | ICD-10-CM | POA: Diagnosis not present

## 2016-12-30 ENCOUNTER — Encounter: Payer: Self-pay | Admitting: Gastroenterology

## 2017-01-13 ENCOUNTER — Ambulatory Visit (AMBULATORY_SURGERY_CENTER): Payer: Commercial Managed Care - PPO | Admitting: Gastroenterology

## 2017-01-13 ENCOUNTER — Encounter: Payer: Self-pay | Admitting: Gastroenterology

## 2017-01-13 VITALS — BP 98/65 | HR 66 | Temp 98.4°F | Resp 14 | Ht 74.0 in | Wt 249.0 lb

## 2017-01-13 DIAGNOSIS — Z1212 Encounter for screening for malignant neoplasm of rectum: Secondary | ICD-10-CM

## 2017-01-13 DIAGNOSIS — Z1211 Encounter for screening for malignant neoplasm of colon: Secondary | ICD-10-CM

## 2017-01-13 DIAGNOSIS — I10 Essential (primary) hypertension: Secondary | ICD-10-CM | POA: Diagnosis not present

## 2017-01-13 MED ORDER — SODIUM CHLORIDE 0.9 % IV SOLN: 500.0000 mL | INTRAVENOUS | Status: DC

## 2017-01-13 NOTE — Op Note (Signed)
[facility] Patient Name: Mario Carpenter Procedure Date: 01/13/2017 7:59 AM MRN: [id number] Endoscopist: Mauri Pole , MD Age: 61 Referring MD:  Date of Birth: 01/25/1956 Gender: Male Account #: [id number] Procedure:                Colonoscopy Indications:              Screening for colorectal malignant neoplasm Medicines:                Monitored Anesthesia Care Procedure:                Pre-Anesthesia Assessment:                           - Prior to the procedure, a History and Physical                            was performed, and patient medications and                            allergies were reviewed. The patient's tolerance of                            previous anesthesia was also reviewed. The risks                            and benefits of the procedure and the sedation                            options and risks were discussed with the patient.                            All questions were answered, and informed consent                            was obtained. Prior Anticoagulants: The patient has                            taken no previous anticoagulant or antiplatelet                            agents. ASA Grade Assessment: II - A patient with                            mild systemic disease. After reviewing the risks                            and benefits, the patient was deemed in                            satisfactory condition to undergo the procedure.                           After obtaining informed consent, the colonoscope  was passed under direct vision. Throughout the                            procedure, the patient's blood pressure, pulse, and                            oxygen saturations were monitored continuously. The                            Colonoscope was introduced through the anus and                            advanced to the the cecum, identified by                            appendiceal  orifice and ileocecal valve. The                            colonoscopy was performed without difficulty. The                            patient tolerated the procedure well. The quality                            of the bowel preparation was excellent. The                            ileocecal valve, appendiceal orifice, and rectum                            were photographed. Scope In: 8:10:02 AM Scope Out: 8:26:44 AM Scope Withdrawal Time: 0 hours 10 minutes 58 seconds  Total Procedure Duration: 0 hours 16 minutes 42 seconds  Findings:                 The perianal and digital rectal examinations were                            normal.                           Scattered small and large-mouthed diverticula were                            found in the sigmoid colon, ascending colon and                            cecum.                           Non-bleeding internal hemorrhoids were found during                            retroflexion. The hemorrhoids were small.  The exam was otherwise without abnormality. Complications:            No immediate complications. Estimated Blood Loss:     Estimated blood loss: none. Impression:               - Diverticulosis in the sigmoid colon, in the                            ascending colon and in the cecum.                           - Non-bleeding internal hemorrhoids.                           - The examination was otherwise normal.                           - No specimens collected. Recommendation:           - Patient has a contact number available for                            emergencies. The signs and symptoms of potential                            delayed complications were discussed with the                            patient. Return to normal activities tomorrow.                            Written discharge instructions were provided to the                            patient.                           - Resume  previous diet.                           - Continue present medications.                           - Repeat colonoscopy in 10 years for screening                            purposes. Mauri Pole, MD 01/13/2017 8:30:42 AM This report has been signed electronically.

## 2017-01-13 NOTE — Patient Instructions (Signed)
YOU HAD AN ENDOSCOPIC PROCEDURE TODAY AT THE [facility]:   Refer to the procedure report that was given to you for any specific questions about what was found during the examination.  If the procedure report does not answer your questions, please call your gastroenterologist to clarify.  If you requested that your care partner not be given the details of your procedure findings, then the procedure report has been included in a sealed envelope for you to review at your convenience later.  YOU SHOULD EXPECT: Some feelings of bloating in the abdomen. Passage of more gas than usual.  Walking can help get rid of the air that was put into your GI tract during the procedure and reduce the bloating. If you had a lower endoscopy (such as a colonoscopy or flexible sigmoidoscopy) you may notice spotting of blood in your stool or on the toilet paper. If you underwent a bowel prep for your procedure, you may not have a normal bowel movement for a few days.  Please Note:  You might notice some irritation and congestion in your nose or some drainage.  This is from the oxygen used during your procedure.  There is no need for concern and it should clear up in a day or so.  SYMPTOMS TO REPORT IMMEDIATELY:   Following lower endoscopy (colonoscopy or flexible sigmoidoscopy):  Excessive amounts of blood in the stool  Significant tenderness or worsening of abdominal pains  Swelling of the abdomen that is new, acute  Fever of 100F or higher    For urgent or emergent issues, a gastroenterologist can be reached at any hour by calling [phone].   DIET:  We do recommend a small meal at first, but then you may proceed to your regular diet.  Drink plenty of fluids but you should avoid alcoholic beverages for 24 hours.  ACTIVITY:  You should plan to take it easy for the rest of today and you should NOT DRIVE or use heavy machinery until tomorrow (because of the sedation medicines used during the test).     FOLLOW UP: Our staff will call the number listed on your records the next business day following your procedure to check on you and address any questions or concerns that you may have regarding the information given to you following your procedure. If we do not reach you, we will leave a message.  However, if you are feeling well and you are not experiencing any problems, there is no need to return our call.  We will assume that you have returned to your regular daily activities without incident.  If any biopsies were taken you will be contacted by phone or by letter within the next 1-3 weeks.  Please call us at [phone] if you have not heard about the biopsies in 3 weeks.    SIGNATURES/CONFIDENTIALITY: You and/or your care partner have signed paperwork which will be entered into your electronic medical record.  These signatures attest to the fact that that the information above on your After Visit Summary has been reviewed and is understood.  Full responsibility of the confidentiality of this discharge information lies with you and/or your care-partner.  Resume medications.Information given on diverticulosis and hemorrhoids. 

## 2017-01-13 NOTE — Progress Notes (Signed)
Patient awakening,vss,report to rn 

## 2017-01-13 NOTE — Progress Notes (Signed)
Pt's states no medical or surgical changes since previsit or office visit. 

## 2017-01-16 ENCOUNTER — Telehealth: Payer: Self-pay

## 2017-01-16 NOTE — Telephone Encounter (Signed)
  Follow up Call-  Call back number 01/13/2017  Post procedure Call Back phone  # [phone]  Permission to leave phone message Yes  Some recent data might be hidden     Patient questions:  Do you have a fever, pain , or abdominal swelling? No. Pain Score  0 *  Have you tolerated food without any problems? Yes.    Have you been able to return to your normal activities? Yes.    Do you have any questions about your discharge instructions: Diet   No. Medications  No. Follow up visit  No.  Do you have questions or concerns about your Care? No.  Actions: * If pain score is 4 or above: No action needed, pain <4.

## 2017-01-20 ENCOUNTER — Ambulatory Visit (INDEPENDENT_AMBULATORY_CARE_PROVIDER_SITE_OTHER): Payer: Commercial Managed Care - PPO | Admitting: Family

## 2017-01-20 ENCOUNTER — Encounter: Payer: Self-pay | Admitting: Family

## 2017-01-20 VITALS — BP 142/77 | HR 63 | Temp 98.1°F | Resp 16 | Ht 73.0 in | Wt 247.4 lb

## 2017-01-20 DIAGNOSIS — N401 Enlarged prostate with lower urinary tract symptoms: Secondary | ICD-10-CM

## 2017-01-20 DIAGNOSIS — R739 Hyperglycemia, unspecified: Secondary | ICD-10-CM | POA: Diagnosis not present

## 2017-01-20 DIAGNOSIS — L509 Urticaria, unspecified: Secondary | ICD-10-CM | POA: Diagnosis not present

## 2017-01-20 DIAGNOSIS — M1612 Unilateral primary osteoarthritis, left hip: Secondary | ICD-10-CM | POA: Diagnosis not present

## 2017-01-20 DIAGNOSIS — N529 Male erectile dysfunction, unspecified: Secondary | ICD-10-CM | POA: Diagnosis not present

## 2017-01-20 DIAGNOSIS — I10 Essential (primary) hypertension: Secondary | ICD-10-CM

## 2017-01-20 DIAGNOSIS — N138 Other obstructive and reflux uropathy: Secondary | ICD-10-CM | POA: Diagnosis not present

## 2017-01-20 DIAGNOSIS — M79671 Pain in right foot: Secondary | ICD-10-CM | POA: Diagnosis not present

## 2017-01-20 DIAGNOSIS — G8929 Other chronic pain: Secondary | ICD-10-CM | POA: Diagnosis not present

## 2017-01-20 LAB — HEMOGLOBIN A1C: Hgb A1c MFr Bld: 5.8 % (ref 4.6–6.5)

## 2017-01-20 LAB — BASIC METABOLIC PANEL
BUN: 14 mg/dL (ref 6–23)
CALCIUM: 9.7 mg/dL (ref 8.4–10.5)
CO2: 28 meq/L (ref 19–32)
CREATININE: 1.16 mg/dL (ref 0.40–1.50)
Chloride: 104 mEq/L (ref 96–112)
GFR: 68.02 mL/min (ref >60.00)
GLUCOSE: 99 mg/dL (ref 70–99)
Potassium: 4.3 mEq/L (ref 3.5–5.1)
Sodium: 137 mEq/L (ref 135–145)

## 2017-01-20 NOTE — Assessment & Plan Note (Addendum)
Stable, continue flomax

## 2017-01-20 NOTE — Assessment & Plan Note (Signed)
Stable with prn viagra.  

## 2017-01-20 NOTE — Assessment & Plan Note (Signed)
Resolved, s/p L THA.

## 2017-01-20 NOTE — Assessment & Plan Note (Addendum)
BP up slightly on current meds.  Monitor for now as it was much lower last check. Continue same.

## 2017-01-20 NOTE — Assessment & Plan Note (Signed)
Stable with use of prn meloxicam. Continue same.

## 2017-01-20 NOTE — Patient Instructions (Signed)
Please complete lab work prior to leaving.   

## 2017-01-20 NOTE — Progress Notes (Signed)
Subjective:    Patient ID: Mario Carpenter, male    DOB: 1956/08/25, 61 y.o.   MRN: [id number]  HPI  HTN-  Continues amlodipine once daily.   BP Readings from Last 3 Encounters:  01/20/17 (!) 142/77  01/13/17 98/65  12/09/16 130/70   Wt Readings from Last 3 Encounters:  01/20/17 247 lb 6.4 oz (112.2 kg)  01/13/17 249 lb (112.9 kg)  12/09/16 249 lb 6.4 oz (113.1 kg)    OA- s/p L THA 12/17. Reports feeling great since he got his new hip. Has returned to playing golf and is happy about this.   BPH- maintained on flomax. Reports no voiding issues. Notes that he is unable to ejaculate while taking flomax but this is not really that bothersome to him at this point.   Uses meloxicam for right foot pain.  Has pain/swelling if he does not take.    ED- continues viagra prn. Reports good response to viagra.  He notes that he has had intermittent raised "knots" on his skin. Itch at times.   Last a few days and then resolve.   Review of Systems See HPI  Past Medical History:  Diagnosis Date  . Alcoholism ([place])    Resolved  . Arthritis   . Foot pain, right   . History of bronchitis   . History of chicken pox   . Hypertension   . Nocturia   . Psoriasis   . Urinary frequency      Social History   Social History  . Marital status: Married    Spouse name: N/A  . Number of children: N/A  . Years of education: N/A   Occupational History  . Not on file.   Social History Main Topics  . Smoking status: Former Smoker    Packs/day: 2.00    Years: 25.00    Types: Cigarettes  . Smokeless tobacco: Never Used  . Alcohol use No     Comment: hx of alcoholism; quit 15 years ago   . Drug use: No     Comment: cocaine,marijuana use in past quit 20 years ago   . Sexual activity: Not on file   Other Topics Concern  . Not on file   Social History Narrative  . No narrative on file    Past Surgical History:  Procedure Laterality Date  . APPENDECTOMY    . HERNIA REPAIR      . KNEE SURGERY     right times 2  . 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 WISDOM TOOTH EXTRACTION      Family History  Problem Relation Age of Onset  . Heart disease Mother 23       Deceased  . Heart defect Mother   . Diabetes Son        type 1  . Diabetes Father 40       Deceased  . Healthy Brother        x1  . Healthy Sister        x2    No Known Allergies  Current Outpatient Prescriptions on File Prior to Visit  Medication Sig Dispense Refill  . amLODipine (NORVASC) 5 MG tablet TAKE 1 TABLET BY MOUTH  DAILY 90 tablet 1  . meloxicam (MOBIC) 7.5 MG tablet Take 1 tablet (7.5 mg total) by mouth daily. 90 tablet 0  .  sildenafil (VIAGRA) 100 MG tablet Take 1 tablet (100 mg total) by mouth daily as needed for erectile dysfunction. 5 tablet 5  . tamsulosin (FLOMAX) 0.4 MG CAPS capsule TAKE 1 CAPSULE BY MOUTH  DAILY 90 capsule 1   No current facility-administered medications on file prior to visit.     BP (!) 142/77 (BP Location: Right Arm, Cuff Size: Large)   Pulse 63   Temp 98.1 F (36.7 C) (Oral)   Resp 16   Ht 6\' 1"  (1.854 m)   Wt 247 lb 6.4 oz (112.2 kg)   SpO2 99%   BMI 32.64 kg/m       Objective:   Physical Exam  Constitutional: He is oriented to person, place, and time. He appears well-developed and well-nourished. No distress.  HENT:  Head: Normocephalic and atraumatic.  Cardiovascular: Normal rate and regular rhythm.   No murmur heard. Pulmonary/Chest: Effort normal and breath sounds normal. No respiratory distress. He has no wheezes. He has no rales.  Musculoskeletal: He exhibits no edema.  Neurological: He is alert and oriented to person, place, and time.  Skin: Skin is warm and dry.  Firm nodular lesion on right upper back near shoulder.  Non-tender  Psychiatric: He has a normal mood and affect. His  behavior is normal. Thought content normal.          Assessment & Plan:  Hives- lesions appear to be hive like lesions.  He reports better on zyrtec.  Discussed referral to allergist but he declines at this time.   Hyperglycemia- check A1C

## 2017-02-03 ENCOUNTER — Other Ambulatory Visit: Payer: Self-pay | Admitting: *Deleted

## 2017-02-03 NOTE — Telephone Encounter (Signed)
Last meloxicam RX:  11/02/16, #90 Last OV: 01/20/17 Next OV: 04/25/17  Please advise request?

## 2017-02-05 MED ORDER — MELOXICAM 7.5 MG PO TABS: 7.5000 mg | ORAL_TABLET | Freq: Every day | ORAL | 0 refills | Status: DC

## 2017-04-25 ENCOUNTER — Ambulatory Visit (INDEPENDENT_AMBULATORY_CARE_PROVIDER_SITE_OTHER): Payer: Commercial Managed Care - PPO | Admitting: Family

## 2017-04-25 ENCOUNTER — Encounter: Payer: Self-pay | Admitting: Family

## 2017-04-25 VITALS — BP 134/82 | HR 66 | Temp 97.9°F | Resp 18 | Ht 74.0 in | Wt 242.6 lb

## 2017-04-25 DIAGNOSIS — Z135 Encounter for screening for eye and ear disorders: Secondary | ICD-10-CM

## 2017-04-25 DIAGNOSIS — Z0000 Encounter for general adult medical examination without abnormal findings: Secondary | ICD-10-CM | POA: Diagnosis not present

## 2017-04-25 DIAGNOSIS — M25512 Pain in left shoulder: Secondary | ICD-10-CM | POA: Diagnosis not present

## 2017-04-25 LAB — CBC WITH DIFFERENTIAL/PLATELET
Basophils Absolute: 0.1 10*3/uL (ref 0.0–0.1)
Basophils Relative: 1 % (ref 0.0–3.0)
EOS PCT: 5.2 % — AB (ref 0.0–5.0)
Eosinophils Absolute: 0.3 10*3/uL (ref 0.0–0.7)
HCT: 42.9 % (ref 39.0–52.0)
Hemoglobin: 14.3 g/dL (ref 13.0–17.0)
LYMPHS ABS: 1.9 10*3/uL (ref 0.7–4.0)
Lymphocytes Relative: 30.8 % (ref 12.0–46.0)
MCHC: 33.3 g/dL (ref 30.0–36.0)
MCV: 88.6 fl (ref 78.0–100.0)
MONO ABS: 0.6 10*3/uL (ref 0.1–1.0)
Monocytes Relative: 9.6 % (ref 3.0–12.0)
NEUTROS ABS: 3.4 10*3/uL (ref 1.4–7.7)
NEUTROS PCT: 53.4 % (ref 43.0–77.0)
PLATELETS: 270 10*3/uL (ref 150.0–400.0)
RBC: 4.84 Mil/uL (ref 4.22–5.81)
RDW: 12.8 % (ref 11.5–15.5)
WBC: 6.3 10*3/uL (ref 4.0–10.5)

## 2017-04-25 LAB — LIPID PANEL
CHOLESTEROL: 167 mg/dL (ref 0–200)
HDL: 40.8 mg/dL (ref >39.00)
LDL CALC: 109 mg/dL — AB (ref 0–99)
NonHDL: 126.49
TRIGLYCERIDES: 86 mg/dL (ref 0.0–149.0)
Total CHOL/HDL Ratio: 4
VLDL: 17.2 mg/dL (ref 0.0–40.0)

## 2017-04-25 LAB — HEPATIC FUNCTION PANEL
ALBUMIN: 4 g/dL (ref 3.5–5.2)
ALT: 23 U/L (ref 0–53)
AST: 20 U/L (ref 0–37)
Alkaline Phosphatase: 51 U/L (ref 39–117)
BILIRUBIN TOTAL: 1.4 mg/dL — AB (ref 0.2–1.2)
Bilirubin, Direct: 0.3 mg/dL (ref 0.0–0.3)
Total Protein: 7 g/dL (ref 6.0–8.3)

## 2017-04-25 LAB — URINALYSIS, ROUTINE W REFLEX MICROSCOPIC
Bilirubin Urine: NEGATIVE
HGB URINE DIPSTICK: NEGATIVE
Ketones, ur: NEGATIVE
LEUKOCYTES UA: NEGATIVE
Nitrite: NEGATIVE
RBC / HPF: NONE SEEN (ref 0–?)
SPECIFIC GRAVITY, URINE: 1.015 (ref 1.000–1.030)
Total Protein, Urine: NEGATIVE
Urine Glucose: NEGATIVE
Urobilinogen, UA: 0.2 (ref 0.0–1.0)
WBC UA: NONE SEEN (ref 0–?)
pH: 7.5 (ref 5.0–8.0)

## 2017-04-25 LAB — BASIC METABOLIC PANEL
BUN: 18 mg/dL (ref 6–23)
CALCIUM: 9.2 mg/dL (ref 8.4–10.5)
CO2: 29 meq/L (ref 19–32)
Chloride: 105 mEq/L (ref 96–112)
Creatinine, Ser: 0.99 mg/dL (ref 0.40–1.50)
GFR: 81.6 mL/min (ref >60.00)
GLUCOSE: 105 mg/dL — AB (ref 70–99)
Potassium: 4.4 mEq/L (ref 3.5–5.1)
Sodium: 138 mEq/L (ref 135–145)

## 2017-04-25 LAB — TSH: TSH: 2.7 u[IU]/mL (ref 0.35–4.50)

## 2017-04-25 LAB — PSA: PSA: 1.97 ng/mL (ref 0.10–4.00)

## 2017-04-25 MED ORDER — ZOSTER VAC RECOMB ADJUVANTED 50 MCG/0.5ML IM SUSR: INTRAMUSCULAR | 1 refills | Status: DC

## 2017-04-25 NOTE — Progress Notes (Signed)
Subjective:    Patient ID: Mario Carpenter, male    DOB: [birth year], 61 y.o.   MRN: [id number]  HPI   Mario Carpenter is a 61 yr old male who presents today for cpx.  Patient presents today for complete physical.  Immunizations: tdap 09/19/14 Diet:  Reports that his diet is "horrible" trying to eat better Exercise:  Having some shoulder pain.  re Wt Readings from Last 3 Encounters:  04/25/17 242 lb 9.6 oz (110 kg)  01/20/17 247 lb 6.4 oz (112.2 kg)  01/13/17 249 lb (112.9 kg)  Colonoscopy: 01/13/17 Vision:several years ago Dental: up to  Lab Results  Component Value Date   PSA 1.90 12/21/2015   PSA 1.96 09/22/2014   PSA 1.45 09/20/2013    Left shoulder pain-has had issues for years following a remote collarbone fracture. However for the last 6 weeks has had increased pain/weakness.    Review of Systems  Constitutional: Negative for unexpected weight change.  HENT: Negative for hearing loss and rhinorrhea.   Eyes: Negative for visual disturbance.  Respiratory: Negative for cough.   Cardiovascular: Negative for leg swelling.  Gastrointestinal: Negative for blood in stool, constipation and diarrhea.  Genitourinary: Negative for difficulty urinating and hematuria.  Musculoskeletal:       Mild left hip pain, s/p THA. Left shoulder pain  Neurological: Negative for headaches.  Hematological: Negative for adenopathy.  Psychiatric/Behavioral:       Denies depression/anxiety   Past Medical History:  Diagnosis Date  . Alcoholism ([place])    Resolved  . Arthritis   . Foot pain, right   . History of bronchitis   . History of chicken pox   . Hypertension   . Nocturia   . Psoriasis   . Urinary frequency      Social History   Social History  . Marital status: Married    Spouse name: N/A  . Number of children: N/A  . Years of education: N/A   Occupational History  . Not on file.   Social History Main Topics  . Smoking status: Former Smoker    Packs/day: 2.00   Years: 25.00    Types: Cigarettes  . Smokeless tobacco: Never Used  . Alcohol use No     Comment: hx of alcoholism; quit 15 years ago   . Drug use: No     Comment: cocaine,marijuana use in past quit 20 years ago   . Sexual activity: Not on file   Other Topics Concern  . Not on file   Social History Narrative  . No narrative on file    Past Surgical History:  Procedure Laterality Date  . APPENDECTOMY    . HERNIA REPAIR    . KNEE SURGERY     right times 2  . 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 WISDOM TOOTH EXTRACTION      Family History  Problem Relation Age of Onset  . Heart disease Mother 59       Deceased  . Heart defect Mother   . Diabetes Son        type 1  . Diabetes Father 32       Deceased  . Healthy Brother        x1  . Heart attack Sister   . Hypertension Sister   . Diabetes Sister  No Known Allergies  Current Outpatient Prescriptions on File Prior to Visit  Medication Sig Dispense Refill  . amLODipine (NORVASC) 5 MG tablet TAKE 1 TABLET BY MOUTH  DAILY 90 tablet 1  . aspirin 81 MG tablet Take 81 mg by mouth daily.    . meloxicam (MOBIC) 7.5 MG tablet Take 1 tablet (7.5 mg total) by mouth daily. 90 tablet 0  . sildenafil (VIAGRA) 100 MG tablet Take 1 tablet (100 mg total) by mouth daily as needed for erectile dysfunction. 5 tablet 5  . tamsulosin (FLOMAX) 0.4 MG CAPS capsule TAKE 1 CAPSULE BY MOUTH  DAILY 90 capsule 1   No current facility-administered medications on file prior to visit.     BP 134/82 (BP Location: Right Arm, Cuff Size: Normal)   Pulse 66   Temp 97.9 F (36.6 C) (Oral)   Resp 18   Ht 6\' 2"  (1.88 m)   Wt 242 lb 9.6 oz (110 kg)   SpO2 99%   BMI 31.15 kg/m       Objective:   Physical Exam Physical Exam  Constitutional: He is oriented to person, place, and time. He  appears well-developed and well-nourished. No distress.  HENT:  Head: Normocephalic and atraumatic.  Right Ear: Tympanic membrane and ear canal normal.  Left Ear: Tympanic membrane and ear canal normal.  Mouth/Throat: Oropharynx is clear and moist.  Eyes: Pupils are equal, round, and reactive to light. No scleral icterus.  Neck: Normal range of motion. No thyromegaly present.  Cardiovascular: Normal rate and regular rhythm.   No murmur heard. Pulmonary/Chest: Effort normal and breath sounds normal. No respiratory distress. He has no wheezes. He has no rales. He exhibits no tenderness.  Abdominal: Soft. Bowel sounds are normal. He exhibits no distension and no mass. There is no tenderness. There is no rebound and no guarding.  Musculoskeletal: He exhibits no edema. L shoulder without swelling or tenderness-  Neg empty can.  Lymphadenopathy:    He has no cervical adenopathy.  Neurological: He is alert and oriented to person, place, and time. He has normal patellar reflexes. He exhibits normal muscle tone. Coordination normal.  Skin: Skin is warm and dry.  Psychiatric: He has a normal mood and affect. His behavior is normal. Judgment and thought content normal.           Assessment & Plan:   Preventative care- discussed diet/exercise/weight loss.  Suggested Shingrix which is on backorder- sent rx to his pharmacy. Tetanus up to date. Obtain routine lab work. recommended that he schedule routine eye exam.    Left shoulder pain- will refer to sports medicine for further evaluation.        Assessment & Plan:

## 2017-04-25 NOTE — Patient Instructions (Addendum)
Complete lab work prior to leaving. You can go to your pharmacy to receive the Shingrix (shingles vaccine). Second shot is 2-6 months after first shot. Please schedule a routine eye exam You will be contacted about your referral to sports medicine or your shoulder. Continue to work on [employer], exercise and weight loss.

## 2017-04-28 ENCOUNTER — Ambulatory Visit (INDEPENDENT_AMBULATORY_CARE_PROVIDER_SITE_OTHER): Payer: Commercial Managed Care - PPO | Admitting: Family Medicine

## 2017-04-28 ENCOUNTER — Encounter: Payer: Self-pay | Admitting: Family Medicine

## 2017-04-28 VITALS — BP 123/84 | HR 71 | Ht 74.0 in | Wt 245.0 lb

## 2017-04-28 DIAGNOSIS — M25512 Pain in left shoulder: Secondary | ICD-10-CM | POA: Diagnosis not present

## 2017-04-28 DIAGNOSIS — M79602 Pain in left arm: Secondary | ICD-10-CM

## 2017-04-28 NOTE — Patient Instructions (Signed)
You have rotator cuff impingement along with mild weakness of thumb opposition, biceps muscle, and scapular stabilizers. Start physical therapy and do home exercises on days you don't go to therapy. I don't think medications will help you with this though you can take tylenol, ibuprofen only if needed for pain. Ok to ice or use heat (whichever feels better) as needed. Follow up with me in 6 weeks for reevaluation.

## 2017-04-29 DIAGNOSIS — M79602 Pain in left arm: Secondary | ICD-10-CM | POA: Insufficient documentation

## 2017-04-29 NOTE — Assessment & Plan Note (Signed)
primary issue is rotator cuff impingement.  Shown home exercises but will start physical therapy.  Tylenol, ibuprofen if needed.  Ice/heat if needed.  He also has a small amount of winging related to his shoulder pathology and likely injury to rhomboids or serratus he describes when doing pushups.  S/p proximal biceps tendon tear earlier this year though good strength grossly.  He has some mild weakness on thumb opposition.  Will go ahead with physical therapy for these.  Distribution isn't consistent with [facility] and he has injuries to explain all but his thumb opposition issue.  We will follow him in 6 weeks.

## 2017-04-29 NOTE — Progress Notes (Signed)
PCP and consultation requested by: [name], NP  Subjective:   HPI: Patient is a 61 y.o. male here for left shoulder pain, arm weakness.  Patient reports back in February or March when lifting logs he felt a sharp pain proximal left bicep muscle. He was seen for this, diagnosed with proximal muscle or tendon tear and developed popeye deformity. Over past 6+ weeks he's noticed more pain and weakness in shoulder though. Pain worse with overhead motions, reaching. Pain level up to 6/10 and sharp at worst. He reports having had a sharp pain/pop before medial to left scapula when doing pushups. Also feels weak to grip a golf club, pushing things with his index finger. No skin changes. Only rare numbness and occasionally has a stiff neck. No bowel/bladder dysfunction.  Past Medical History:  Diagnosis Date  . Alcoholism ([place])    Resolved  . Arthritis   . Foot pain, right   . History of bronchitis   . History of chicken pox   . Hypertension   . Nocturia   . Psoriasis   . Urinary frequency     Current Outpatient Prescriptions on File Prior to Visit  Medication Sig Dispense Refill  . amLODipine (NORVASC) 5 MG tablet TAKE 1 TABLET BY MOUTH  DAILY 90 tablet 1  . aspirin 81 MG tablet Take 81 mg by mouth daily.    . meloxicam (MOBIC) 7.5 MG tablet Take 1 tablet (7.5 mg total) by mouth daily. 90 tablet 0  . sildenafil (VIAGRA) 100 MG tablet Take 1 tablet (100 mg total) by mouth daily as needed for erectile dysfunction. 5 tablet 5  . tamsulosin (FLOMAX) 0.4 MG CAPS capsule TAKE 1 CAPSULE BY MOUTH  DAILY 90 capsule 1  . Zoster Vac Recomb Adjuvanted [facility]) injection Inject 78mcg IM now and again in 2-6 months 0.5 mL 1   No current facility-administered medications on file prior to visit.     Past Surgical History:  Procedure Laterality Date  . APPENDECTOMY    . HERNIA REPAIR    . KNEE SURGERY     right times 2  . TOTAL HIP ARTHROPLASTY Left 08/12/2016   Procedure: LEFT  TOTAL HIP ARTHROPLASTY ANTERIOR APPROACH;  Surgeon: Gaynelle Arabian, MD;  Location: [facility];  Service: Orthopedics;  Laterality: Left;  . WISDOM TOOTH EXTRACTION      No Known Allergies  Social History   Social History  . Marital status: Married    Spouse name: N/A  . Number of children: N/A  . Years of education: N/A   Occupational History  . Not on file.   Social History Main Topics  . Smoking status: Former Smoker    Packs/day: 2.00    Years: 25.00    Types: Cigarettes  . Smokeless tobacco: Never Used  . Alcohol use No     Comment: hx of alcoholism; quit 15 years ago   . Drug use: No     Comment: cocaine,marijuana use in past quit 20 years ago   . Sexual activity: Not on file   Other Topics Concern  . Not on file   Social History Narrative   Works at [employer]   2 sons (one in [place] and one in [place])   2 stepdaughters   Enjoys golf   Married (second marriage)          Family History  Problem Relation Age of Onset  . Heart disease Mother 74       Deceased  . Heart  defect Mother        hx of rheumatic fever, valve replacement  . Diabetes Son        type 1  . Diabetes Father 33       Deceased  . Healthy Brother        x1  . Heart attack Sister        26, stent in LAD  . Hypertension Sister   . Diabetes Sister     BP 123/84   Pulse 71   Ht 6\' 2"  (1.88 m)   Wt 245 lb (111.1 kg)   BMI 31.46 kg/m   Review of Systems: See HPI above.     Objective:  Physical Exam:  Gen: NAD, comfortable in exam room  Neck: No gross deformity, swelling, bruising. No TTP .  No midline/bony TTP. FROM. 5-/5 strength with left thumb opposition, 5/5 all other upper extremity muscle groups.   Sensation intact to light touch.   2+ equal reflexes in triceps, biceps, brachioradialis tendons. Negative spurlings. NV intact distal BUEs.  Left shoulder: Popeye deformity of left bicep muscle.  No swelling, ecchymoses.  Slight left scapular winging. No  TTP. FROM. Positive Hawkins, Neers. Negative Yergasons. Strength 5/5 with empty can and resisted internal/external rotation.  Mild pain empty can. Negative apprehension. NV intact distally.  Right shoulder: FROM without pain.   Assessment & Plan:  1. Left shoulder/arm pain - primary issue is rotator cuff impingement.  Shown home exercises but will start physical therapy.  Tylenol, ibuprofen if needed.  Ice/heat if needed.  He also has a small amount of winging related to his shoulder pathology and likely injury to rhomboids or serratus he describes when doing pushups.  S/p proximal biceps tendon tear earlier this year though good strength grossly.  He has some mild weakness on thumb opposition.  Will go ahead with physical therapy for these.  Distribution isn't consistent with [facility] and he has injuries to explain all but his thumb opposition issue.  We will follow him in 6 weeks.

## 2017-05-02 ENCOUNTER — Encounter: Payer: Self-pay | Admitting: Physical Therapy

## 2017-05-02 ENCOUNTER — Ambulatory Visit (INDEPENDENT_AMBULATORY_CARE_PROVIDER_SITE_OTHER): Payer: Commercial Managed Care - PPO | Admitting: Physical Therapy

## 2017-05-02 DIAGNOSIS — M25512 Pain in left shoulder: Secondary | ICD-10-CM | POA: Diagnosis not present

## 2017-05-02 NOTE — Therapy (Signed)
[facility] [address] [place], [place], [address] Phone: [phone]   Fax:  [phone]  Physical Therapy Evaluation  Patient Details  Name: Mario Carpenter MRN: [id number] Date of Birth: 11/26/1955 Referring Provider: Karlton Lemon  Encounter Date: 05/02/2017      PT End of Session - 05/02/17 0924    Visit Number 1   Number of Visits 6   Date for PT Re-Evaluation 06/13/17   PT Start Time 0805   PT Stop Time 0845   PT Time Calculation (min) 40 min   Activity Tolerance Patient tolerated treatment well   Behavior During Therapy [facility] for tasks assessed/performed      Past Medical History:  Diagnosis Date  . Alcoholism ([place])    Resolved  . Arthritis   . Foot pain, right   . History of bronchitis   . History of chicken pox   . Hypertension   . Nocturia   . Psoriasis   . Urinary frequency     Past Surgical History:  Procedure Laterality Date  . APPENDECTOMY    . HERNIA REPAIR    . KNEE SURGERY     right times 2  . 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 WISDOM TOOTH EXTRACTION      There were no vitals filed for this visit.       Subjective Assessment - 05/02/17 0807    Pertinent History Pt states increased pain in L shoulder for about 2 months, tore L bicep a few months ago, and had increased pain with push ups about 1 month ago. Also had fracture of L clavice about 3 years ago. He states shoulder feels tight and sore with increased activity and feels that arm is weak, especially into thumb and pointer finger. He states previous tingling into these fingers, now just feels weak.  Pt works full time in [employer], and sits most of the time. He is R handed.    Patient Stated Goals Decreased pain, increased activity/excersice, golf    Currently in Pain? Yes   Pain  Score 2    Pain Location Shoulder   Pain Orientation Left   Pain Descriptors / Indicators Aching;Cramping   Pain Type Acute pain   Pain Onset More than a month ago   Pain Frequency Intermittent            [facility] PT Assessment - 05/02/17 0001      Assessment   Medical Diagnosis Pain in Left Shoulder   Referring Provider Shane Hudnall   Hand Dominance Right   Next MD Visit 06/08/17     Precautions   Precautions None     Prior Function   Level of Independence Independent     AROM   Overall AROM Comments R WNL   AROM Assessment Site Shoulder   Right/Left Shoulder Left   Left Shoulder Flexion 150 Degrees   Left Shoulder ABduction 150 Degrees   Left Shoulder Internal Rotation 55 Degrees   Left Shoulder External Rotation 55 Degrees     PROM   Overall PROM Comments R WNL   PROM Assessment Site Shoulder   Right/Left Shoulder Left     Strength   Overall Strength Comments R WNL   Strength Assessment Site Shoulder   Right/Left Shoulder Left   Left Shoulder Flexion 4/5   Left Shoulder ABduction  4/5   Left Shoulder Internal Rotation 4/5   Left Shoulder External Rotation 4/5   Right Hand Grip (lbs) 80   Left Hand Grip (lbs) 40            Objective measurements completed on examination: See above findings.          [facility] Adult PT Treatment/Exercise - 05/02/17 0829      Exercises   Exercises Shoulder     Shoulder Exercises: Supine   External Rotation AAROM;20 reps   External Rotation Limitations cane   Flexion AAROM;20 reps   Flexion Limitations cane     Shoulder Exercises: Sidelying   External Rotation AROM;20 reps     Shoulder Exercises: Stretch   Corner Stretch 3 reps;30 seconds   Corner Stretch Limitations Doorway     Manual Therapy   Manual Therapy Joint mobilization;Passive ROM   Passive ROM All directions                  PT Short Term Goals - 05/02/17 1101      PT SHORT TERM GOAL #1   Title Pt to demo independence with  initial HEP   Time 2   Period Weeks   Status New   Target Date 05/16/17           PT Long Term Goals - 05/02/17 1101      PT LONG TERM GOAL #1   Title Pt to demo decreased pain to 0-1/10 with UE activity, to improve ability for reaching   Time 6   Period Weeks   Status New   Target Date 06/13/17     PT LONG TERM GOAL #2   Title Pt to demo improved L shoulder ROM, to be WNL, to improve ability for reaching, lifting, carrying, and IADLS   Time 6   Period Weeks   Target Date 06/13/17     PT LONG TERM GOAL #3   Title Pt to be independent with long term HEP for Shoulder ROM and strengthening.   Time 6   Period Weeks   Status New   Target Date 06/13/17                Plan - 05/02/17 8119    Clinical Impression Statement Patient presents with primary complaint of  pain in L shoulder, reporting multiple injuries. Pt with mild decrease in ROM, and increased pain with activity. Pt with decreased strength and stability, and decreased ability for full functional activity. Pt with decreased ability for ADLs, IADLs, reaching, carrying, and lifting type activities, as well as work duties. Pt to benefit from skilled PT to improve above deficits. Pt's prognosis for recovery is good. Pt has been instructed in HEP today, and plan of care has been discussed.   Clinical Presentation Stable   Clinical Decision Making Low   Clinical Impairments Affecting Rehab Potential Pt requests to be seen 1x/wk   PT Frequency 2x / week   PT Duration 6 weeks   PT Treatment/Interventions ADLs/Self Care Home Management;Cryotherapy;Electrical Stimulation;Moist Heat;Ultrasound;Neuromuscular re-education;Therapeutic exercise;Therapeutic activities;Functional mobility training;Patient/family education;Manual techniques;Passive range of motion;Taping;Dry needling   PT Next Visit Plan Review HEP, Add ROM and light strengthening   PT Home Exercise Plan Doorway stretch, Cane/supine, S/L ER,    Consulted and  Agree with Plan of Care Patient      Patient will benefit from skilled therapeutic intervention in order to improve the following deficits and impairments:  Hypomobility, Decreased activity tolerance, Decreased strength, Impaired UE  functional use, Pain, Decreased mobility  Visit Diagnosis: Acute pain of left shoulder - Plan: PT plan of care cert/re-cert     Problem List Patient Active Problem List   Diagnosis Date Noted  . Left arm pain 04/29/2017  . Erectile dysfunction 12/21/2015  . HTN (hypertension) 12/08/2014  . BPH with obstruction/lower urinary tract symptoms 09/22/2014  . Routine general medical examination at a health care facility 09/20/2013  . Cervical disc disease 03/24/2013  . Annual physical exam 09/16/2012  . Pure hypercholesterolemia 11/11/2010  . Psoriasis 11/11/2010  . Chronic pain in right foot 11/11/2010   Lyndee Hensen, PT, DPT 11:25 AM  05/02/17    [facility] [place], [place], [address] Phone: [phone]   Fax:  [phone]  Name: Mario Carpenter MRN: [id number] Date of Birth: June 09, 1956

## 2017-05-03 ENCOUNTER — Other Ambulatory Visit: Payer: Self-pay | Admitting: Family

## 2017-05-10 ENCOUNTER — Other Ambulatory Visit: Payer: Self-pay | Admitting: Family

## 2017-05-15 ENCOUNTER — Other Ambulatory Visit: Payer: Self-pay | Admitting: *Deleted

## 2017-05-15 MED ORDER — MELOXICAM 7.5 MG PO TABS: 7.5000 mg | ORAL_TABLET | Freq: Every day | ORAL | 0 refills | Status: DC

## 2017-05-15 NOTE — Telephone Encounter (Signed)
Received fax request from [facility] for Mobic 7.5mg , take 1 tablet by mouth daily. Last Rx 02/05/17, #90.  Please advise?

## 2017-05-18 ENCOUNTER — Ambulatory Visit (INDEPENDENT_AMBULATORY_CARE_PROVIDER_SITE_OTHER): Payer: Commercial Managed Care - PPO | Admitting: Physical Therapy

## 2017-05-18 ENCOUNTER — Encounter: Payer: Self-pay | Admitting: Physical Therapy

## 2017-05-18 DIAGNOSIS — M25512 Pain in left shoulder: Secondary | ICD-10-CM

## 2017-05-18 NOTE — Therapy (Signed)
[place] [facility] [address] [place], [place], [address] Phone: [phone]   Fax:  [phone]  Physical Therapy Treatment  Patient Details  Name: Mario Carpenter MRN: [id number] Date of Birth: 1955-11-30 Referring Provider: Karlton Lemon  Encounter Date: 05/18/2017      PT End of Session - 05/18/17 0916    Visit Number 2   Number of Visits 6   Date for PT Re-Evaluation 06/13/17   PT Start Time 0805   PT Stop Time 0845   PT Time Calculation (min) 40 min   Activity Tolerance Patient tolerated treatment well   Behavior During Therapy [facility] for tasks assessed/performed      Past Medical History:  Diagnosis Date  . Alcoholism ([place])    Resolved  . Arthritis   . Foot pain, right   . History of bronchitis   . History of chicken pox   . Hypertension   . Nocturia   . Psoriasis   . Urinary frequency     Past Surgical History:  Procedure Laterality Date  . APPENDECTOMY    . HERNIA REPAIR    . KNEE SURGERY     right times 2  . 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 WISDOM TOOTH EXTRACTION      There were no vitals filed for this visit.      Subjective Assessment - 05/18/17 0914    Subjective Pt states mild soreness in last couple days, due to trimming bushes with heavy clippers. He has been doing HEP, unsure if he is doing some of them correctly.    Currently in Pain? Yes   Pain Score 3    Pain Location Shoulder   Pain Orientation Left   Pain Descriptors / Indicators Aching;Cramping   Pain Type Acute pain   Pain Onset More than a month ago   Pain Frequency Intermittent   Aggravating Factors  Increased activity/use of L UE    Pain Relieving Factors REst                         [facility] Adult PT Treatment/Exercise - 05/18/17 0805      Exercises   Exercises Shoulder     Shoulder Exercises: Supine   External Rotation --   External Rotation Limitations --   Flexion AAROM;AROM;20 reps   Flexion Limitations cane for aarom,  1 lb for AROM     Shoulder Exercises: Sidelying   External Rotation AROM;20 reps     Shoulder Exercises: Standing   External Rotation 20 reps   Theraband Level (Shoulder External Rotation) Level 2 (Red)   Internal Rotation 20 reps   Theraband Level (Shoulder Internal Rotation) Level 2 (Red)   Row 20 reps   Theraband Level (Shoulder Row) Level 3 (Green)   Other Standing Exercises Bicep Curl 2x15  Red T-band      Shoulder Exercises: Pulleys   Flexion 2 minutes     Shoulder Exercises: ROM/Strengthening   Ball on Wall 2x20 small ball for scap stabs     Shoulder Exercises: Stretch   Corner Stretch --   Warehouse manager Limitations --     Manual Therapy   Manual Therapy Joint mobilization;Passive ROM;Soft tissue mobilization   Soft tissue mobilization pec release   Passive ROM All directions  PT Education - 05/18/17 0915    Education provided Yes   Education Details HEP, progression of HEP, education on Dx.    Person(s) Educated Patient   Methods Explanation;Handout   Comprehension Verbalized understanding;Need further instruction          PT Short Term Goals - 05/02/17 1101      PT SHORT TERM GOAL #1   Title Pt to demo independence with initial HEP   Time 2   Period Weeks   Status New   Target Date 05/16/17           PT Long Term Goals - 05/02/17 1101      PT LONG TERM GOAL #1   Title Pt to demo decreased pain to 0-1/10 with UE activity, to improve ability for reaching   Time 6   Period Weeks   Status New   Target Date 06/13/17     PT LONG TERM GOAL #2   Title Pt to demo improved L shoulder ROM, to be WNL, to improve ability for reaching, lifting, carrying, and IADLS   Time 6   Period Weeks   Target Date 06/13/17     PT LONG TERM GOAL #3   Title Pt to  be independent with long term HEP for Shoulder ROM and strengthening.   Time 6   Period Weeks   Status New   Target Date 06/13/17               Plan - 05/18/17 0917    Clinical Impression Statement HEP reviewed today for proper mechanics, pt requires mod/max cuing for this, and for posture during ther ex performed today. Light strengthening progressed today. Pt with fatigue during ER, but no increased pain. Pt given handout for IR/ER and rows with t-band today. Plan to progress strength as tolerated, and continue to teach HEP.    Clinical Impairments Affecting Rehab Potential Pt requests to be seen 1x/wk   PT Frequency 2x / week   PT Duration 6 weeks   PT Treatment/Interventions ADLs/Self Care Home Management;Cryotherapy;Electrical Stimulation;Moist Heat;Ultrasound;Neuromuscular re-education;Therapeutic exercise;Therapeutic activities;Functional mobility training;Patient/family education;Manual techniques;Passive range of motion;Taping;Dry needling   PT Next Visit Plan Progress strength   PT Home Exercise Plan Doorway stretch, Cane/supine, S/L ER,   T-band rows and IR/ER,     Consulted and Agree with Plan of Care Patient      Patient will benefit from skilled therapeutic intervention in order to improve the following deficits and impairments:  Hypomobility, Decreased activity tolerance, Decreased strength, Impaired UE functional use, Pain, Decreased mobility  Visit Diagnosis: Left shoulder pain, unspecified chronicity     Problem List Patient Active Problem List   Diagnosis Date Noted  . Left arm pain 04/29/2017  . Erectile dysfunction 12/21/2015  . HTN (hypertension) 12/08/2014  . BPH with obstruction/lower urinary tract symptoms 09/22/2014  . Routine general medical examination at a health care facility 09/20/2013  . Cervical disc disease 03/24/2013  . Annual physical exam 09/16/2012  . Pure hypercholesterolemia 11/11/2010  . Psoriasis 11/11/2010  . Chronic pain in  right foot 11/11/2010    Lyndee Hensen, PT, DPT 9:19 AM  05/18/17    [facility] [place], [place], [address] Phone: [phone]   Fax:  [phone]  Name: RISHIT BURKHALTER MRN: [id number] Date of Birth: 03/23/1956

## 2017-05-20 ENCOUNTER — Other Ambulatory Visit: Payer: Self-pay | Admitting: Family

## 2017-05-25 ENCOUNTER — Ambulatory Visit (INDEPENDENT_AMBULATORY_CARE_PROVIDER_SITE_OTHER): Payer: Commercial Managed Care - PPO | Admitting: Physical Therapy

## 2017-05-25 ENCOUNTER — Encounter: Payer: Self-pay | Admitting: Physical Therapy

## 2017-05-25 DIAGNOSIS — M25512 Pain in left shoulder: Secondary | ICD-10-CM

## 2017-05-25 NOTE — Therapy (Addendum)
[place] [address] [place], [place], [address] Phone: [phone]   Fax:  [phone]  Physical Therapy Treatment/Discharge  Patient Details  Name: Mario Carpenter MRN: [id number] Date of Birth: 1956/08/13 Referring Provider: Karlton Lemon  Encounter Date: 05/25/2017      PT End of Session - 05/25/17 1251    Visit Number 3   Number of Visits 6   Date for PT Re-Evaluation 06/13/17   PT Start Time 5093   PT Stop Time 1230   PT Time Calculation (min) 42 min   Activity Tolerance Patient tolerated treatment well   Behavior During Therapy [facility] for tasks assessed/performed      Past Medical History:  Diagnosis Date  . Alcoholism ([place])    Resolved  . Arthritis   . Foot pain, right   . History of bronchitis   . History of chicken pox   . Hypertension   . Nocturia   . Psoriasis   . Urinary frequency     Past Surgical History:  Procedure Laterality Date  . APPENDECTOMY    . HERNIA REPAIR    . KNEE SURGERY     right times 2  . 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 WISDOM TOOTH EXTRACTION      There were no vitals filed for this visit.      Subjective Assessment - 05/25/17 1249    Subjective Pt states soreness in shoulder, but mostly a cramping feeling in bicep region when he over does activity. He states not being very compliant with HEP this week.    Currently in Pain? Yes   Pain Score 3    Pain Location Shoulder   Pain Orientation Left   Pain Descriptors / Indicators Aching;Cramping   Pain Type Acute pain   Pain Onset More than a month ago   Pain Frequency Intermittent   Aggravating Factors  Increased use of L UE   Pain Relieving Factors rest                         [facility] Adult PT Treatment/Exercise - 05/25/17 0001      Shoulder  Exercises: Supine   Flexion 20 reps;AAROM;Strengthening   Flexion Limitations cane for aarom,  1 lb for AROM     Shoulder Exercises: Sidelying   External Rotation Strengthening;20 reps;Weights   External Rotation Weight (lbs) 1lb x10, 2lb x10     Shoulder Exercises: Standing   External Rotation 20 reps   Theraband Level (Shoulder External Rotation) Level 2 (Red)   Internal Rotation 20 reps   Theraband Level (Shoulder Internal Rotation) Level 2 (Red)   Row 20 reps   Theraband Level (Shoulder Row) Level 3 (Green)   Other Standing Exercises Bicep Curl 2x15  Red T-band      Shoulder Exercises: Pulleys   Flexion 2 minutes     Shoulder Exercises: Stretch   Corner Stretch 3 reps;30 seconds   Corner Stretch Limitations Doorway     Manual Therapy   Manual Therapy Passive ROM;Soft tissue mobilization;Neural Stretch   Soft tissue mobilization STM to anterior shouler and proximal bicep   Passive ROM All directions   Neural Stretch Pec stretch with nerve glide/manual                PT Education - 05/25/17  1251    Education provided Yes   Education Details On importance of HEP, review of mechanics for ther ex and HEP   Person(s) Educated Patient   Methods Explanation   Comprehension Verbalized understanding;Need further instruction          PT Short Term Goals - 05/02/17 1101      PT SHORT TERM GOAL #1   Title Pt to demo independence with initial HEP   Time 2   Period Weeks   Status New   Target Date 05/16/17           PT Long Term Goals - 05/02/17 1101      PT LONG TERM GOAL #1   Title Pt to demo decreased pain to 0-1/10 with UE activity, to improve ability for reaching   Time 6   Period Weeks   Status New   Target Date 06/13/17     PT LONG TERM GOAL #2   Title Pt to demo improved L shoulder ROM, to be WNL, to improve ability for reaching, lifting, carrying, and IADLS   Time 6   Period Weeks   Target Date 06/13/17     PT LONG TERM GOAL #3   Title Pt  to be independent with long term HEP for Shoulder ROM and strengthening.   Time 6   Period Weeks   Status New   Target Date 06/13/17               Plan - 05/25/17 1253    Clinical Impression Statement Pt requires mod-max cuing today for correct performance and mechanics with ther ex. Pt educated on mechanics and frequency for HEP. Pt with no increased pain with exercises done today, but does have increased fatigue with ER, and improved soreness when cued for improved posture. Pt to benefit from continued strengthening for RTC and stretching for anterior shoulder.    Clinical Impairments Affecting Rehab Potential Pt requests to be seen 1x/wk   PT Frequency 2x / week   PT Duration 6 weeks   PT Treatment/Interventions ADLs/Self Care Home Management;Cryotherapy;Electrical Stimulation;Moist Heat;Ultrasound;Neuromuscular re-education;Therapeutic exercise;Therapeutic activities;Functional mobility training;Patient/family education;Manual techniques;Passive range of motion;Taping;Dry needling   PT Next Visit Plan Progress strength   PT Home Exercise Plan Doorway stretch, Cane/supine, S/L ER,   T-band rows and IR/ER,     Consulted and Agree with Plan of Care Patient      Patient will benefit from skilled therapeutic intervention in order to improve the following deficits and impairments:  Hypomobility, Decreased activity tolerance, Decreased strength, Impaired UE functional use, Pain, Decreased mobility  Visit Diagnosis: Left shoulder pain, unspecified chronicity     Problem List Patient Active Problem List   Diagnosis Date Noted  . Left arm pain 04/29/2017  . Erectile dysfunction 12/21/2015  . HTN (hypertension) 12/08/2014  . BPH with obstruction/lower urinary tract symptoms 09/22/2014  . Routine general medical examination at a health care facility 09/20/2013  . Cervical disc disease 03/24/2013  . Annual physical exam 09/16/2012  . Pure hypercholesterolemia 11/11/2010  .  Psoriasis 11/11/2010  . Chronic pain in right foot 11/11/2010   Lyndee Hensen, PT, DPT 12:55 PM  05/25/17    [facility] [place], [place], [address] Phone: [phone]   Fax:  [phone]  Name: Mario Carpenter MRN: [id number] Date of Birth: 03/24/1956       PHYSICAL THERAPY DISCHARGE SUMMARY  Visits from Start of Care: 3  Current functional level related to  goals / functional outcomes: See above   Remaining deficits: Unknown; pt cx remaining appts and did not reschedule   Education / Equipment: HEP  Plan: Patient agrees to discharge.  Patient goals were not met. Patient is being discharged due to not returning since the last visit.  ?????     Laureen Abrahams, PT, DPT 06/27/17 1:56 PM   [place], [place], [address] Phone: [phone]  Fax: [phone]

## 2017-06-05 ENCOUNTER — Other Ambulatory Visit (INDEPENDENT_AMBULATORY_CARE_PROVIDER_SITE_OTHER): Payer: Commercial Managed Care - PPO

## 2017-06-05 LAB — HEPATIC FUNCTION PANEL
ALBUMIN: 4.1 g/dL (ref 3.5–5.2)
ALT: 19 U/L (ref 0–53)
AST: 17 U/L (ref 0–37)
Alkaline Phosphatase: 44 U/L (ref 39–117)
BILIRUBIN TOTAL: 1.2 mg/dL (ref 0.2–1.2)
Bilirubin, Direct: 0.2 mg/dL (ref 0.0–0.3)
TOTAL PROTEIN: 6.4 g/dL (ref 6.0–8.3)

## 2017-06-08 ENCOUNTER — Ambulatory Visit: Payer: Commercial Managed Care - PPO | Admitting: Family Medicine

## 2017-07-12 ENCOUNTER — Telehealth: Payer: Self-pay | Admitting: *Deleted

## 2017-07-12 MED ORDER — MELOXICAM 7.5 MG PO TABS: 7.5000 mg | ORAL_TABLET | Freq: Every day | ORAL | 0 refills | Status: DC

## 2017-07-12 NOTE — Telephone Encounter (Signed)
Received fax from [facility] requesting refill of mobic 7.5mg . Please advise?

## 2017-09-26 ENCOUNTER — Telehealth: Payer: Self-pay | Admitting: Family

## 2017-09-26 NOTE — Telephone Encounter (Signed)
Patient Saving RX card was mailed on 09/26/2017, Per patient never picked up. RX bin was cleaned out.

## 2017-10-04 DIAGNOSIS — M79671 Pain in right foot: Secondary | ICD-10-CM | POA: Diagnosis not present

## 2017-10-04 DIAGNOSIS — M19071 Primary osteoarthritis, right ankle and foot: Secondary | ICD-10-CM | POA: Diagnosis not present

## 2017-10-09 ENCOUNTER — Telehealth: Payer: Self-pay | Admitting: *Deleted

## 2017-10-09 MED ORDER — TADALAFIL 10 MG PO TABS: 10.0000 mg | ORAL_TABLET | Freq: Every day | ORAL | 5 refills | Status: DC | PRN

## 2017-10-09 NOTE — Telephone Encounter (Signed)
Copied from [place]. Topic: General - Other >> Oct 06, 2017 11:32 AM Carolyn Stare wrote:  Pt call to say he received a coupon in the mail for CIALIS and is asking if he can get a rx for that med    pt phone number [place]

## 2017-10-25 ENCOUNTER — Encounter: Payer: Self-pay | Admitting: Family

## 2017-10-25 ENCOUNTER — Ambulatory Visit: Payer: Commercial Managed Care - PPO | Admitting: Family

## 2017-10-25 VITALS — BP 126/67 | HR 68 | Temp 98.3°F | Resp 18 | Ht 74.0 in | Wt 253.4 lb

## 2017-10-25 DIAGNOSIS — N529 Male erectile dysfunction, unspecified: Secondary | ICD-10-CM | POA: Diagnosis not present

## 2017-10-25 DIAGNOSIS — M1990 Unspecified osteoarthritis, unspecified site: Secondary | ICD-10-CM | POA: Diagnosis not present

## 2017-10-25 DIAGNOSIS — N401 Enlarged prostate with lower urinary tract symptoms: Secondary | ICD-10-CM | POA: Diagnosis not present

## 2017-10-25 DIAGNOSIS — I10 Essential (primary) hypertension: Secondary | ICD-10-CM | POA: Diagnosis not present

## 2017-10-25 LAB — COMPREHENSIVE METABOLIC PANEL
ALBUMIN: 4.1 g/dL (ref 3.5–5.2)
ALK PHOS: 45 U/L (ref 39–117)
ALT: 22 U/L (ref 0–53)
AST: 19 U/L (ref 0–37)
BUN: 15 mg/dL (ref 6–23)
CALCIUM: 9.4 mg/dL (ref 8.4–10.5)
CHLORIDE: 104 meq/L (ref 96–112)
CO2: 31 mEq/L (ref 19–32)
CREATININE: 1.08 mg/dL (ref 0.40–1.50)
GFR: 73.68 mL/min (ref >60.00)
Glucose, Bld: 100 mg/dL — ABNORMAL HIGH (ref 70–99)
POTASSIUM: 4.6 meq/L (ref 3.5–5.1)
Sodium: 139 mEq/L (ref 135–145)
TOTAL PROTEIN: 6.8 g/dL (ref 6.0–8.3)
Total Bilirubin: 1.3 mg/dL — ABNORMAL HIGH (ref 0.2–1.2)

## 2017-10-25 MED ORDER — MELOXICAM 15 MG PO TABS: 15.0000 mg | ORAL_TABLET | Freq: Every day | ORAL | 0 refills | Status: DC

## 2017-10-25 MED ORDER — TADALAFIL 5 MG PO TABS: 5.0000 mg | ORAL_TABLET | Freq: Every day | ORAL | 5 refills | Status: DC

## 2017-10-25 NOTE — Progress Notes (Signed)
Subjective:    Patient ID: Mario Carpenter, male    DOB: [birth year], 62 y.o.   MRN: [id number]  HPI  Patient is a 62 yr old male who presents today for follow up.  HTN- maintained on amlodipine. Reports mild LE edema.  Not bothersome.   BP Readings from Last 3 Encounters:  10/25/17 126/67  04/28/17 123/84  04/25/17 134/82   BPH- maintained on flomax.  Occasional nocturia.  Reduced stream. Has taken cialis 5mg  once daily for last 3 days and had a better night with nocturia. Lab Results  Component Value Date   PSA 1.97 04/25/2017   PSA 1.90 12/21/2015   PSA 1.96 09/22/2014   Osteoarthritis- maintained on meloxicam.  Has foot pain.  Seeing  Podiatry. Taking 15mg  of meloxicam in the AM.    ED- was on cialis, was ineffective. Has returned to viagra.  Review of Systems See HPI  Past Medical History:  Diagnosis Date  . Alcoholism ([place])    Resolved  . Arthritis   . Foot pain, right   . History of bronchitis   . History of chicken pox   . Hypertension   . Nocturia   . Psoriasis   . Urinary frequency      Social History   Socioeconomic History  . Marital status: Married    Spouse name: Not on file  . Number of children: Not on file  . Years of education: Not on file  . Highest education level: Not on file  Social Needs  . Financial resource strain: Not on file  . Food insecurity - worry: Not on file  . Food insecurity - inability: Not on file  . Transportation needs - medical: Not on file  . Transportation needs - non-medical: Not on file  Occupational History  . Not on file  Tobacco Use  . Smoking status: Former Smoker    Packs/day: 2.00    Years: 25.00    Pack years: 50.00    Types: Cigarettes  . Smokeless tobacco: Never Used  Substance and Sexual Activity  . Alcohol use: No    Comment: hx of alcoholism; quit 15 years ago   . Drug use: No    Comment: cocaine,marijuana use in past quit 20 years ago   . Sexual activity: Not on file  Other Topics  Concern  . Not on file  Social History Narrative   Works at [employer]   2 sons (one in [place] and one in [place])   2 stepdaughters   Enjoys golf   Married (second marriage)       Past Surgical History:  Procedure Laterality Date  . APPENDECTOMY    . HERNIA REPAIR    . KNEE SURGERY     right times 2  . 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 WISDOM TOOTH EXTRACTION      Family History  Problem Relation Age of Onset  . Heart disease Mother 64       Deceased  . Heart defect Mother        hx of rheumatic fever, valve replacement  . Diabetes Son        type 1  . Diabetes Father 82       Deceased  . Healthy Brother        x1  . Heart attack Sister  64, stent in LAD  . Hypertension Sister   . Diabetes Sister     No Known Allergies  Current Outpatient Medications on File Prior to Visit  Medication Sig Dispense Refill  . amLODipine (NORVASC) 5 MG tablet TAKE 1 TABLET BY MOUTH  DAILY 90 tablet 1  . aspirin 81 MG tablet Take 81 mg by mouth daily.    . meloxicam (MOBIC) 7.5 MG tablet Take 1 tablet (7.5 mg total) daily by mouth. 90 tablet 0  . sildenafil (VIAGRA) 100 MG tablet Take 100 mg by mouth daily as needed for erectile dysfunction.    . tamsulosin (FLOMAX) 0.4 MG CAPS capsule TAKE 1 CAPSULE BY MOUTH  DAILY 90 capsule 1   No current facility-administered medications on file prior to visit.     BP 126/67 (BP Location: Left Arm, Cuff Size: Large)   Pulse 68   Temp 98.3 F (36.8 C) (Oral)   Resp 18   Ht 6\' 2"  (1.88 m)   Wt 253 lb 6.4 oz (114.9 kg)   SpO2 98%   BMI 32.53 kg/m       Objective:   Physical Exam  Constitutional: He is oriented to person, place, and time. He appears well-developed and well-nourished. No distress.  HENT:  Head: Normocephalic and atraumatic.  Cardiovascular:  Normal rate and regular rhythm.  No murmur heard. Pulmonary/Chest: Effort normal and breath sounds normal. No respiratory distress. He has no wheezes. He has no rales.  Musculoskeletal: He exhibits no edema.  Neurological: He is alert and oriented to person, place, and time.  Skin: Skin is warm and dry.  Psychiatric: He has a normal mood and affect. His behavior is normal. Thought content normal.          Assessment & Plan:  HTN- BP stable on amlodipine, continue same. Obtain bmet.   ED- not improved with prn cialis. Does have improvement with viagra.  Wishes to d/c viagra and try cialis one a day to see if this may help with ED. He has already had some benefit from a BPH standpoint with his BPH.  OA- using meloxicam 15mg . This works better than the 7.5 mg for him.   bph- continue flomax, trial of daily cialis.

## 2017-10-25 NOTE — Patient Instructions (Addendum)
Please complete lab work prior to leaving.  Stop viagra, start daily flomax.

## 2017-12-13 ENCOUNTER — Other Ambulatory Visit: Payer: Self-pay | Admitting: Family

## 2018-01-30 ENCOUNTER — Other Ambulatory Visit: Payer: Self-pay | Admitting: Family

## 2018-01-30 NOTE — Telephone Encounter (Signed)
Pt requesting refill of Meloxicam, last filled on 10/25/17 #30. Pt also requesting refills on Amlodipine and Tamsulosin but last prescription was filled on 12/13/17 #90.  Last OV: 10/25/17 [name] Pharmacy:[facility]

## 2018-01-30 NOTE — Telephone Encounter (Signed)
Copied from [place]. Topic: Quick Communication - Rx Refill/Question >> Jan 30, 2018 11:26 AM Synthia Innocent wrote: Medication: amLODipine (NORVASC) 5 MG tablet, meloxicam (MOBIC) 15 MG tablet and tamsulosin (FLOMAX) 0.4 MG CAPS capsule  Has the patient contacted their pharmacy? Yes.  Told to contact us for refill since Melissa is only a NP, need MD to order (Agent: If no, request that the patient contact the pharmacy for the refill.) (Agent: If yes, when and what did the pharmacy advise?)  Preferred Pharmacy (with phone number or street name): [facility]  Agent: Please be advised that RX refills may take up to 3 business days. We ask that you follow-up with your pharmacy.

## 2018-01-31 ENCOUNTER — Other Ambulatory Visit: Payer: Self-pay

## 2018-01-31 MED ORDER — MELOXICAM 15 MG PO TABS: 15.0000 mg | ORAL_TABLET | Freq: Every day | ORAL | 0 refills | Status: DC

## 2018-03-25 ENCOUNTER — Other Ambulatory Visit: Payer: Self-pay | Admitting: Family

## 2018-03-26 MED ORDER — MELOXICAM 15 MG PO TABS: 15.0000 mg | ORAL_TABLET | Freq: Every day | ORAL | 0 refills | Status: DC

## 2018-04-14 ENCOUNTER — Other Ambulatory Visit: Payer: Self-pay | Admitting: Family

## 2018-04-16 ENCOUNTER — Other Ambulatory Visit: Payer: Self-pay

## 2018-04-16 NOTE — Telephone Encounter (Signed)
Received refill request for sildenafil (VIAGRA) 100 mg tablet.   Last OV: 10/25/17 Last RF: 12/28/16  Called pt to confirm that he does want this refilled since last OV note on 10/25/17 said he was stopping sildenafil. Pt confirmed that he does want to refill sildenafil at this time.   Please advise.

## 2018-04-29 ENCOUNTER — Other Ambulatory Visit: Payer: Self-pay | Admitting: Family

## 2018-04-30 NOTE — Telephone Encounter (Signed)
Cialis refill sent in error. Pt has sildenafil on current med list. Cancelled cialis rx per Roselyn Reef @ [facility].

## 2018-05-07 ENCOUNTER — Other Ambulatory Visit: Payer: Self-pay | Admitting: Family

## 2018-05-10 ENCOUNTER — Telehealth: Payer: Self-pay | Admitting: Family

## 2018-05-10 MED ORDER — SILDENAFIL CITRATE 100 MG PO TABS: 100.0000 mg | ORAL_TABLET | Freq: Every day | ORAL | 5 refills | Status: DC | PRN

## 2018-05-10 NOTE — Telephone Encounter (Signed)
Copied from [place] [id number]. Topic: Quick Communication - Rx Refill/Question >> May 10, 2018 11:57 AM Neva Seat wrote:   [id number] [place] [address] Phone: [phone] Fax: [phone]

## 2018-05-26 ENCOUNTER — Other Ambulatory Visit: Payer: Self-pay | Admitting: Family

## 2018-07-02 ENCOUNTER — Other Ambulatory Visit: Payer: Self-pay | Admitting: Family

## 2018-07-02 NOTE — Telephone Encounter (Signed)
Attempted to notify pt and left detailed message to call for appointment with [facility] soon. Also sent mychart message.

## 2018-07-02 NOTE — Telephone Encounter (Signed)
Refilled, but needs appt with Melissa. TY.

## 2018-07-09 ENCOUNTER — Encounter: Payer: Self-pay | Admitting: Family

## 2018-07-09 ENCOUNTER — Ambulatory Visit: Payer: Commercial Managed Care - PPO | Admitting: Family

## 2018-07-09 VITALS — BP 144/80 | HR 69 | Temp 97.9°F | Resp 16 | Ht 74.0 in | Wt 247.6 lb

## 2018-07-09 DIAGNOSIS — I10 Essential (primary) hypertension: Secondary | ICD-10-CM

## 2018-07-09 DIAGNOSIS — Z23 Encounter for immunization: Secondary | ICD-10-CM

## 2018-07-09 DIAGNOSIS — N401 Enlarged prostate with lower urinary tract symptoms: Secondary | ICD-10-CM | POA: Diagnosis not present

## 2018-07-09 DIAGNOSIS — E785 Hyperlipidemia, unspecified: Secondary | ICD-10-CM | POA: Diagnosis not present

## 2018-07-09 DIAGNOSIS — N138 Other obstructive and reflux uropathy: Secondary | ICD-10-CM | POA: Diagnosis not present

## 2018-07-09 DIAGNOSIS — R002 Palpitations: Secondary | ICD-10-CM | POA: Diagnosis not present

## 2018-07-09 DIAGNOSIS — N529 Male erectile dysfunction, unspecified: Secondary | ICD-10-CM

## 2018-07-09 LAB — COMPREHENSIVE METABOLIC PANEL
ALBUMIN: 4.5 g/dL (ref 3.5–5.2)
ALT: 21 U/L (ref 0–53)
AST: 19 U/L (ref 0–37)
Alkaline Phosphatase: 52 U/L (ref 39–117)
BUN: 19 mg/dL (ref 6–23)
CALCIUM: 9.9 mg/dL (ref 8.4–10.5)
CHLORIDE: 103 meq/L (ref 96–112)
CO2: 30 meq/L (ref 19–32)
CREATININE: 1.1 mg/dL (ref 0.40–1.50)
GFR: 71.97 mL/min (ref >60.00)
Glucose, Bld: 101 mg/dL — ABNORMAL HIGH (ref 70–99)
POTASSIUM: 4.7 meq/L (ref 3.5–5.1)
SODIUM: 138 meq/L (ref 135–145)
Total Bilirubin: 1.3 mg/dL — ABNORMAL HIGH (ref 0.2–1.2)
Total Protein: 6.8 g/dL (ref 6.0–8.3)

## 2018-07-09 LAB — LIPID PANEL
CHOLESTEROL: 174 mg/dL (ref 0–200)
HDL: 51.3 mg/dL (ref >39.00)
LDL Cholesterol: 113 mg/dL — ABNORMAL HIGH (ref 0–99)
NONHDL: 123.16
TRIGLYCERIDES: 49 mg/dL (ref 0.0–149.0)
Total CHOL/HDL Ratio: 3
VLDL: 9.8 mg/dL (ref 0.0–40.0)

## 2018-07-09 LAB — TSH: TSH: 2.61 u[IU]/mL (ref 0.35–4.50)

## 2018-07-09 LAB — PSA: PSA: 1.44 ng/mL (ref 0.10–4.00)

## 2018-07-09 NOTE — Progress Notes (Signed)
Subjective:    Patient ID: Mario Carpenter, male    DOB: 24-Dec-1955, 63 y.o.   MRN: [id number]  HPI  Patient is a 62 year old male who presents today for routine follow-up.  Hypertension- patient includes amlodipine 5 mg once daily. Reports that he forgot to take his medicine yesterday. Notes an occasional "skipped beat."  Only notices when he lays down resting on his bed.  BP Readings from Last 3 Encounters:  07/09/18 (!) 144/80  10/25/17 126/67  04/28/17 123/84   Hyperlipidemia-not on statin.   Lab Results  Component Value Date   CHOL 167 04/25/2017   HDL 40.80 04/25/2017   LDLCALC 109 (H) 04/25/2017   TRIG 86.0 04/25/2017   CHOLHDL 4 04/25/2017    BPH- continues flomax.  Reports that his voiding is good. Wakes up a couple of times a night.   Lab Results  Component Value Date   PSA 1.97 04/25/2017   PSA 1.90 12/21/2015   PSA 1.96 09/22/2014   ED-he reports that he has intermittent issues with ED.  "Sometimes it works and sometimes it does not" he reports that he is not bothered by this at this time.   Review of Systems See HPI  Past Medical History:  Diagnosis Date  . Alcoholism ([place])    Resolved  . Arthritis   . Foot pain, right   . History of bronchitis   . History of chicken pox   . Hypertension   . Nocturia   . Psoriasis   . Urinary frequency      Social History   Socioeconomic History  . Marital status: Married    Spouse name: Not on file  . Number of children: Not on file  . Years of education: Not on file  . Highest education level: Not on file  Occupational History  . Not on file  Social Needs  . Financial resource strain: Not on file  . Food insecurity:    Worry: Not on file    Inability: Not on file  . Transportation needs:    Medical: Not on file    Non-medical: Not on file  Tobacco Use  . Smoking status: Former Smoker    Packs/day: 2.00    Years: 25.00    Pack years: 50.00    Types: Cigarettes  . Smokeless tobacco: Never  Used  Substance and Sexual Activity  . Alcohol use: No    Comment: hx of alcoholism; quit 15 years ago   . Drug use: No    Comment: cocaine,marijuana use in past quit 20 years ago   . Sexual activity: Not on file  Lifestyle  . Physical activity:    Days per week: Not on file    Minutes per session: Not on file  . Stress: Not on file  Relationships  . Social connections:    Talks on phone: Not on file    Gets together: Not on file    Attends religious service: Not on file    Active member of club or organization: Not on file    Attends meetings of clubs or organizations: Not on file    Relationship status: Not on file  . Intimate partner violence:    Fear of current or ex partner: Not on file    Emotionally abused: Not on file    Physically abused: Not on file    Forced sexual activity: Not on file  Other Topics Concern  . Not on file  Social History Narrative  Works at [employer]   2 sons (one in [place] and one in [place])   2 stepdaughters   Enjoys golf   Married (second marriage)       Past Surgical History:  Procedure Laterality Date  . APPENDECTOMY    . HERNIA REPAIR    . KNEE SURGERY     right times 2  . 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 WISDOM TOOTH EXTRACTION      Family History  Problem Relation Age of Onset  . Heart disease Mother 52       Deceased  . Heart defect Mother        hx of rheumatic fever, valve replacement  . Diabetes Son        type 1  . Diabetes Father 41       Deceased  . Healthy Brother        x1  . Heart attack Sister        29, stent in LAD  . Hypertension Sister   . Diabetes Sister     No Known Allergies  Current Outpatient Medications on File Prior to Visit  Medication Sig Dispense Refill  . amLODipine (NORVASC) 5 MG tablet TAKE 1 TABLET BY MOUTH  DAILY  90 tablet 0  . aspirin 81 MG tablet Take 81 mg by mouth daily.    . meloxicam (MOBIC) 15 MG tablet TAKE 1 TABLET BY MOUTH  DAILY 90 tablet 0  . tadalafil (CIALIS) 5 MG tablet TAKE 1 TABLET BY MOUTH EVERY DAY 30 tablet 2  . tamsulosin (FLOMAX) 0.4 MG CAPS capsule TAKE 1 CAPSULE BY MOUTH  DAILY 90 capsule 0   No current facility-administered medications on file prior to visit.     BP (!) 144/80 (BP Location: Right Arm, Cuff Size: Large)   Pulse 69   Temp 97.9 F (36.6 C) (Oral)   Resp 16   Ht 6\' 2"  (1.88 m)   Wt 247 lb 9.6 oz (112.3 kg)   SpO2 97%   BMI 31.79 kg/m   Wt Readings from Last 3 Encounters:  07/09/18 247 lb 9.6 oz (112.3 kg)  10/25/17 253 lb 6.4 oz (114.9 kg)  04/28/17 245 lb (111.1 kg)       Objective:   Physical Exam  Constitutional: He is oriented to person, place, and time. He appears well-developed and well-nourished. No distress.  HENT:  Head: Normocephalic and atraumatic.  Cardiovascular: Normal rate and regular rhythm.  No murmur heard. Pulmonary/Chest: Effort normal and breath sounds normal. No respiratory distress. He has no wheezes. He has no rales.  Musculoskeletal: He exhibits no edema.  Neurological: He is alert and oriented to person, place, and time.  Skin: Skin is warm and dry.  Psychiatric: He has a normal mood and affect. His behavior is normal. Thought content normal.          Assessment & Plan:  HTN- BP up slightly today. Likely due to forgetting his blood pressure medicine yesterday.  We will continue to monitor.  Hyperlipidemia-not on statin.  Admits to some dietary indiscretion but is recently started exercising again.  Will obtain follow-up lipid panel.  ED-fair control with Cialis.  He stated that he was dosing as needed Viagra in addition to the Cialis.  I have advised him to discontinue the Viagra and instead to take an  additional 5 mg of Cialis as needed not to exceed 10 mg in 24 hours.  Palpitations-EKG tracing is personally  reviewed.  EKG notes NSR.  No acute changes.  Suspect intermittent PVCs.  Will obtain electrolytes and TSH today.  Symptoms are mild.  Patient is advised to call if symptoms worsen or become more frequent.  BPH-stable on Flomax and Cialis.  Continue same.  Will obtain follow-up PSA.

## 2018-07-09 NOTE — Patient Instructions (Signed)
Please call if frequency/severity of palpitations worsens. Stop viagra. Complete lab work prior to leaving.

## 2018-08-18 ENCOUNTER — Other Ambulatory Visit: Payer: Self-pay | Admitting: Family Medicine

## 2018-08-24 ENCOUNTER — Other Ambulatory Visit: Payer: Self-pay | Admitting: Family

## 2018-09-18 IMAGING — CR DG CHEST 2V
2 series · 2 of 2 positions shown · non-contrast
Comparison: None.

CLINICAL DATA: Preoperative examination for total hip replacement
(08/12/2016).

EXAM:
CHEST  2 VIEW

[w chest pa]
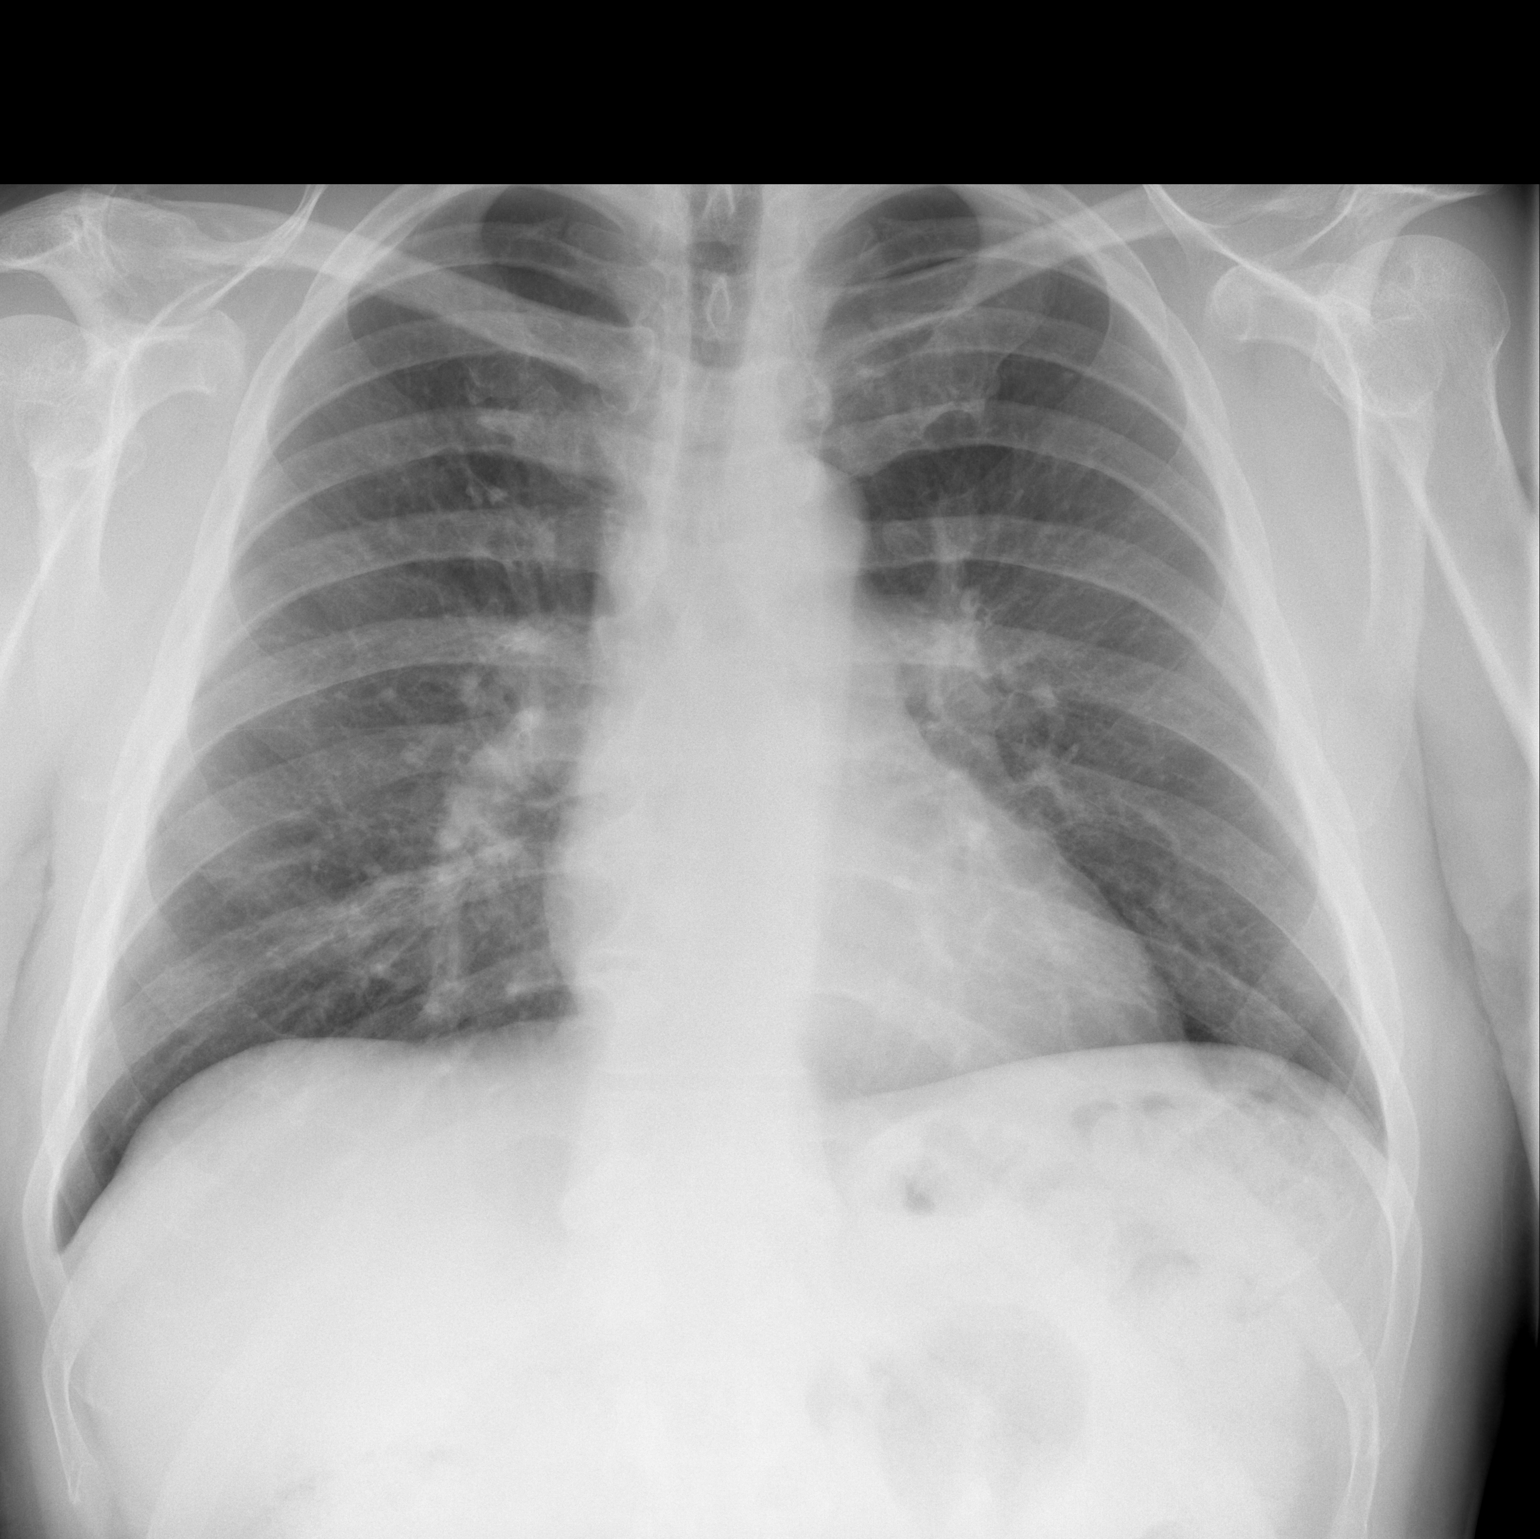

[w chest lat]
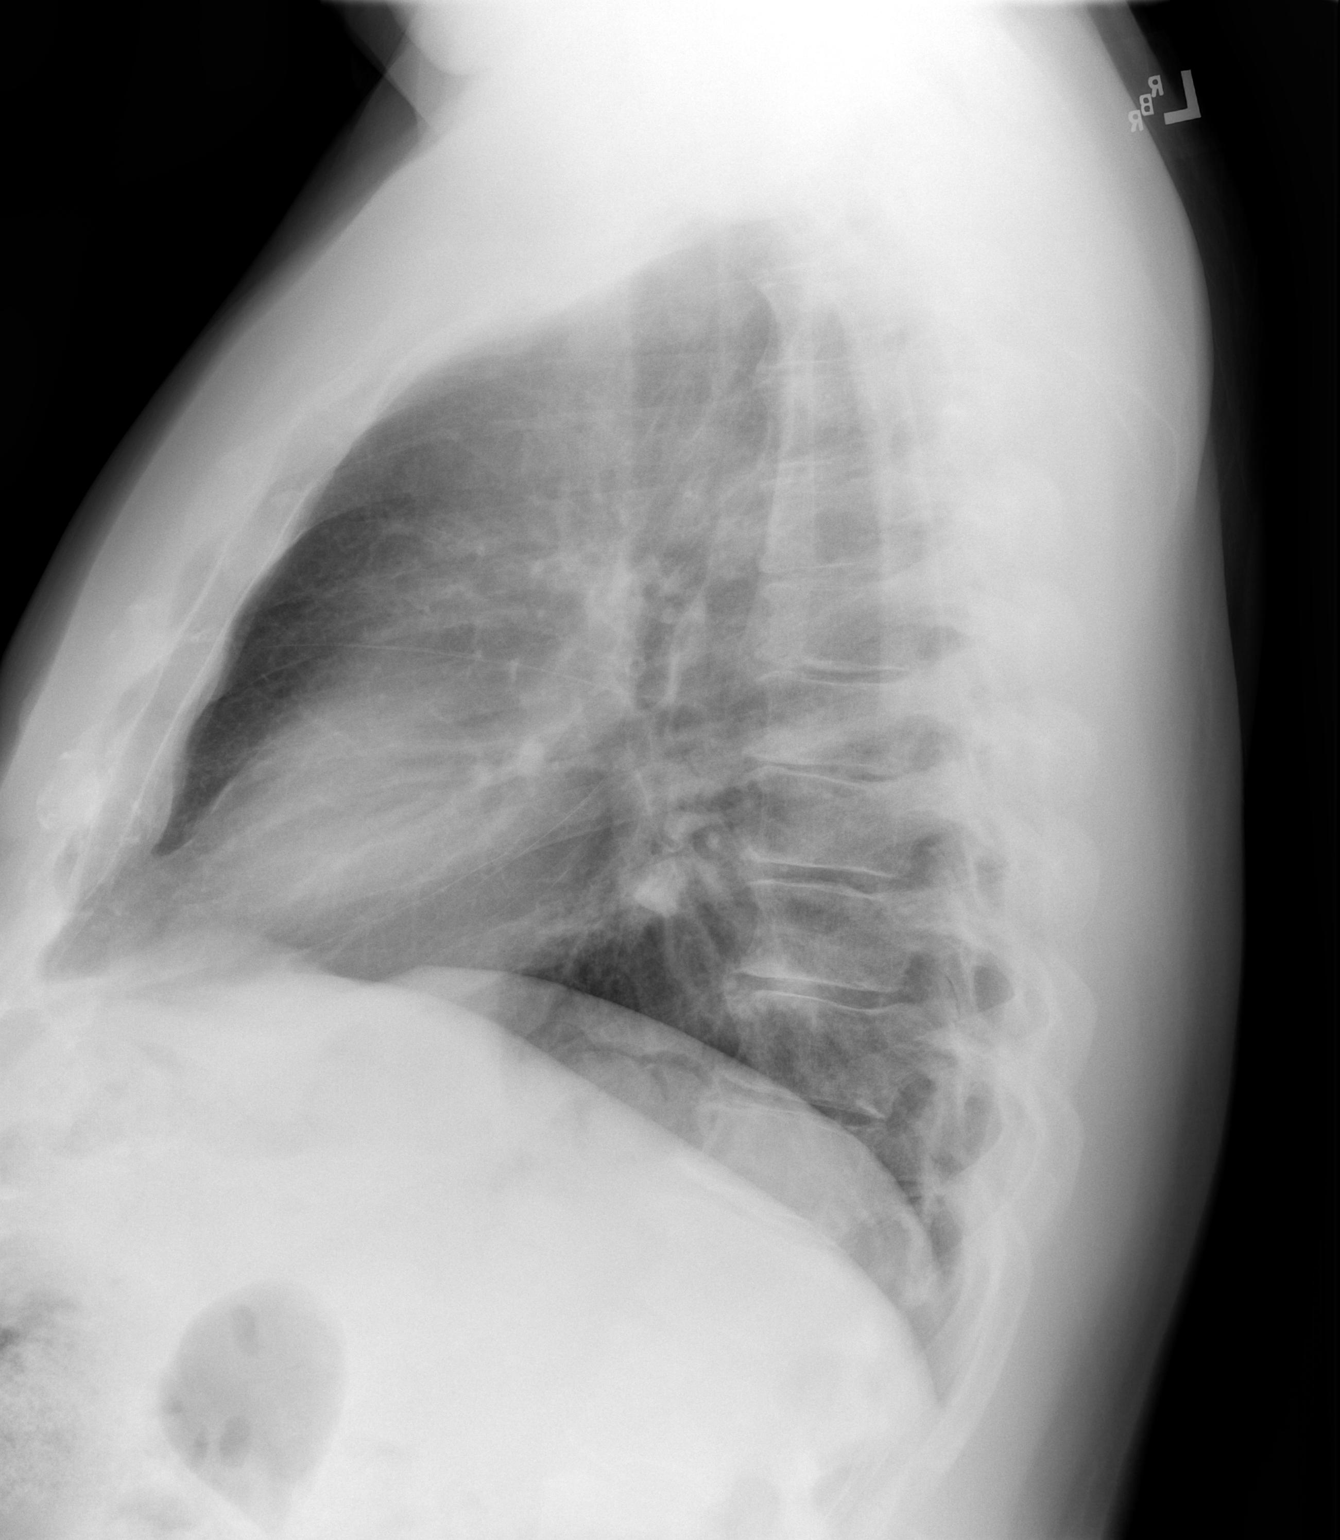

[2 of 2 positions shown; findings below may reference images not displayed]

FINDINGS: Normal cardiac silhouette and mediastinal contours. No focal
parenchymal opacities. No pleural effusion or pneumothorax. There is
minimal pleural parenchymal thickening about the bilateral major and
the right minor fissures. Mild-to-moderate degenerative change
within the mid and lower thoracic spine. Age-indeterminate though
presumably chronic mild (approximately 25%) compression deformity
involving a lower thoracic vertebral body. Suspected deformity
involving the distal end of the right clavicle, incompletely
evaluated. Regional soft tissues appear normal.
IMPRESSION: 1. No acute cardiopulmonary disease.
2. Age-indeterminate though presumably chronic mild (approximately
25%) compression deformity involving a lower thoracic vertebral
body.

## 2018-10-09 IMAGING — DX DG PORTABLE PELVIS
1 series · 1 of 1 positions shown · non-contrast
Comparison: 09/22/2014

CLINICAL DATA: Postop left hip replacement.

EXAM:
PORTABLE PELVIS 1-2 VIEWS

[pelvis ap]
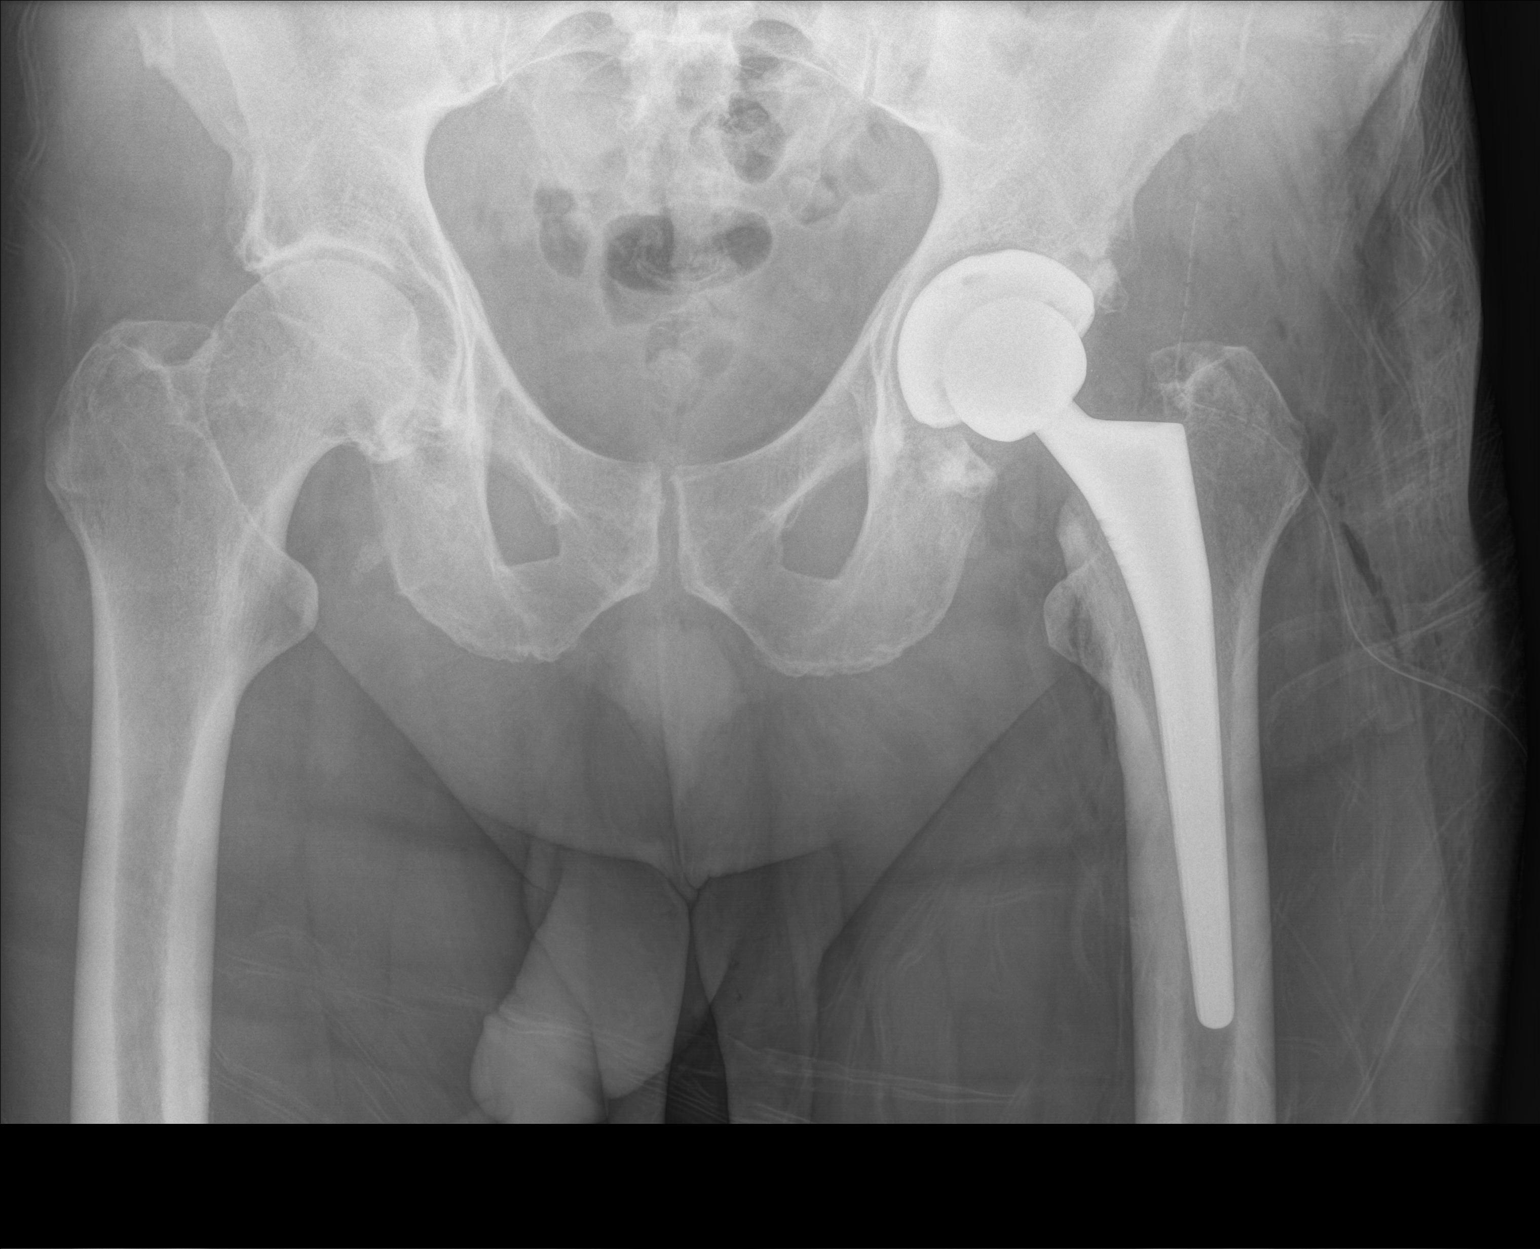

[1 of 1 positions shown; findings below may reference images not displayed]

FINDINGS: Examination demonstrates a left total hip arthroplasty intact and
normally located. Surgical drain present over the superolateral soft
tissues of the left hip. Mild to moderate degenerative change of the
right hip.
IMPRESSION: Evidence of patient's recent left total hip arthroplasty without
complicating features.

## 2018-11-24 ENCOUNTER — Other Ambulatory Visit: Payer: Self-pay | Admitting: Family

## 2018-12-20 ENCOUNTER — Other Ambulatory Visit: Payer: Self-pay | Admitting: Family Medicine

## 2019-02-01 ENCOUNTER — Ambulatory Visit (INDEPENDENT_AMBULATORY_CARE_PROVIDER_SITE_OTHER): Payer: Commercial Managed Care - PPO | Admitting: Family

## 2019-02-01 ENCOUNTER — Other Ambulatory Visit: Payer: Self-pay

## 2019-02-01 ENCOUNTER — Encounter: Payer: Self-pay | Admitting: Family

## 2019-02-01 ENCOUNTER — Ambulatory Visit: Payer: Commercial Managed Care - PPO | Admitting: Family

## 2019-02-01 DIAGNOSIS — H6981 Other specified disorders of Eustachian tube, right ear: Secondary | ICD-10-CM

## 2019-02-01 DIAGNOSIS — E669 Obesity, unspecified: Secondary | ICD-10-CM

## 2019-02-01 MED ORDER — FLUTICASONE PROPIONATE 50 MCG/ACT NA SUSP: 2.0000 | Freq: Every day | NASAL | 6 refills | Status: DC

## 2019-02-01 NOTE — Progress Notes (Signed)
Virtual Visit via Video Note  I connected with Mario Carpenter on 02/01/19 at  1:20 PM EDT by a video enabled telemedicine application and verified that I am speaking with the correct person using two identifiers. This visit type was conducted due to national recommendations for restrictions regarding the COVID-19 Pandemic (e.g. social distancing).  This format is felt to be most appropriate for this patient at this time.   I discussed the limitations of evaluation and management by telemedicine and the availability of in person appointments. The patient expressed understanding and agreed to proceed.  Only the patient and myself were on today's video visit. The patient was at home and I was in my office at the time of today's visit.   History of Present Illness:  Patient reports that he has had pulsing in the right ear especially if he is laying down.  Denies ear pain, drainage or fever.  Does note a bit of a pressure sensation in the right ear.  Denies associated swollen glands in his neck. Reports that he is taking an allergy pill ("allertec"). Reports that he first noticed this about a week ago.  He reports some chronic sinus congestion.  Of note he states he has had several recent rounds of antibiotics due to a dental abscess.  He reports that the tooth will come out in a month and he is not currently having pain in this tooth which is located on the left due to the fact that he has had a previous root canal on this tooth.  Obesity- he reports that he is interested in losing weight.  He hopes that with weight loss he may be able to come off of his antihypertensive.   Past Medical History:  Diagnosis Date  . Alcoholism ([place])    Resolved  . Arthritis   . Foot pain, right   . History of bronchitis   . History of chicken pox   . Hypertension   . Nocturia   . Psoriasis   . Urinary frequency      Social History   Socioeconomic History  . Marital status: Married    Spouse name:  Not on file  . Number of children: Not on file  . Years of education: Not on file  . Highest education level: Not on file  Occupational History  . Not on file  Social Needs  . Financial resource strain: Not on file  . Food insecurity:    Worry: Not on file    Inability: Not on file  . Transportation needs:    Medical: Not on file    Non-medical: Not on file  Tobacco Use  . Smoking status: Former Smoker    Packs/day: 2.00    Years: 25.00    Pack years: 50.00    Types: Cigarettes  . Smokeless tobacco: Never Used  Substance and Sexual Activity  . Alcohol use: No    Comment: hx of alcoholism; quit 15 years ago   . Drug use: No    Comment: cocaine,marijuana use in past quit 20 years ago   . Sexual activity: Not on file  Lifestyle  . Physical activity:    Days per week: Not on file    Minutes per session: Not on file  . Stress: Not on file  Relationships  . Social connections:    Talks on phone: Not on file    Gets together: Not on file    Attends religious service: Not on file    Active  member of club or organization: Not on file    Attends meetings of clubs or organizations: Not on file    Relationship status: Not on file  . Intimate partner violence:    Fear of current or ex partner: Not on file    Emotionally abused: Not on file    Physically abused: Not on file    Forced sexual activity: Not on file  Other Topics Concern  . Not on file  Social History Narrative   Works at [employer]   2 sons (one in [place] and one in [place])   2 stepdaughters   Enjoys golf   Married (second marriage)       Past Surgical History:  Procedure Laterality Date  . APPENDECTOMY    . HERNIA REPAIR    . KNEE SURGERY     right times 2  . 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 WISDOM TOOTH EXTRACTION      Family  History  Problem Relation Age of Onset  . Heart disease Mother 49       Deceased  . Heart defect Mother        hx of rheumatic fever, valve replacement  . Diabetes Son        type 1  . Diabetes Father 52       Deceased  . Healthy Brother        x1  . Heart attack Sister        10, stent in LAD  . Hypertension Sister   . Diabetes Sister     No Known Allergies  Current Outpatient Medications on File Prior to Visit  Medication Sig Dispense Refill  . amLODipine (NORVASC) 5 MG tablet TAKE 1 TABLET BY MOUTH  DAILY 90 tablet 0  . aspirin 81 MG tablet Take 81 mg by mouth daily.    . meloxicam (MOBIC) 15 MG tablet TAKE 1 TABLET BY MOUTH  DAILY 90 tablet 0  . tadalafil (CIALIS) 5 MG tablet TAKE 1 TABLET BY MOUTH EVERY DAY 30 tablet 2  . tamsulosin (FLOMAX) 0.4 MG CAPS capsule TAKE 1 CAPSULE BY MOUTH  DAILY 90 capsule 0   No current facility-administered medications on file prior to visit.     There were no vitals taken for this visit.   Observations/Objective:  Gen: Awake, alert, no acute distress Resp: Breathing is even and non-labored Psych: calm/pleasant demeanor Neuro: Alert and Oriented x 3, + facial symmetry, speech is clear.   Assessment and Plan:  Eustachian tube dysfunction- symptoms most consistent with you take station tube dysfunction.  I have suggested that patient continue his antihistamine and add Flonase 2 sprays each nostril once daily.  If symptoms worsen or fail to improve could consider amoxicillin.  Obesity-we discussed some healthy dietary modifications as well as importance of weight loss.  Goal weight would be 220 pounds.   Follow Up Instructions:    I discussed the assessment and treatment plan with the patient. The patient was provided an opportunity to ask questions and all were answered. The patient agreed with the plan and demonstrated an understanding of the instructions.   The patient was advised to call back or seek an in-person evaluation  if the symptoms worsen or if the condition fails to improve as anticipated.    Nance Pear, NP

## 2019-02-26 ENCOUNTER — Other Ambulatory Visit: Payer: Self-pay | Admitting: Family

## 2019-03-15 ENCOUNTER — Other Ambulatory Visit: Payer: Self-pay | Admitting: Family Medicine

## 2019-05-11 ENCOUNTER — Other Ambulatory Visit: Payer: Self-pay | Admitting: Family

## 2019-05-28 ENCOUNTER — Other Ambulatory Visit: Payer: Self-pay | Admitting: Family

## 2019-06-05 ENCOUNTER — Other Ambulatory Visit: Payer: Self-pay | Admitting: Family

## 2019-07-07 ENCOUNTER — Other Ambulatory Visit: Payer: Self-pay | Admitting: Family

## 2019-07-15 ENCOUNTER — Other Ambulatory Visit: Payer: Self-pay | Admitting: Family

## 2019-07-23 ENCOUNTER — Ambulatory Visit: Payer: Commercial Managed Care - PPO | Admitting: Family

## 2019-07-23 ENCOUNTER — Encounter: Payer: Self-pay | Admitting: Family

## 2019-07-23 ENCOUNTER — Other Ambulatory Visit: Payer: Self-pay

## 2019-07-23 VITALS — BP 126/70 | HR 59 | Temp 96.9°F | Resp 16 | Ht 74.0 in | Wt 199.0 lb

## 2019-07-23 DIAGNOSIS — J309 Allergic rhinitis, unspecified: Secondary | ICD-10-CM

## 2019-07-23 DIAGNOSIS — R351 Nocturia: Secondary | ICD-10-CM | POA: Diagnosis not present

## 2019-07-23 DIAGNOSIS — N401 Enlarged prostate with lower urinary tract symptoms: Secondary | ICD-10-CM | POA: Diagnosis not present

## 2019-07-23 DIAGNOSIS — N138 Other obstructive and reflux uropathy: Secondary | ICD-10-CM

## 2019-07-23 DIAGNOSIS — N529 Male erectile dysfunction, unspecified: Secondary | ICD-10-CM | POA: Diagnosis not present

## 2019-07-23 DIAGNOSIS — I10 Essential (primary) hypertension: Secondary | ICD-10-CM | POA: Diagnosis not present

## 2019-07-23 LAB — BASIC METABOLIC PANEL
BUN: 24 mg/dL — ABNORMAL HIGH (ref 6–23)
CO2: 29 mEq/L (ref 19–32)
Calcium: 9.5 mg/dL (ref 8.4–10.5)
Chloride: 102 mEq/L (ref 96–112)
Creatinine, Ser: 0.88 mg/dL (ref 0.40–1.50)
GFR: 87.31 mL/min (ref >60.00)
Glucose, Bld: 92 mg/dL (ref 70–99)
Potassium: 4.6 mEq/L (ref 3.5–5.1)
Sodium: 137 mEq/L (ref 135–145)

## 2019-07-23 LAB — PSA: PSA: 1.4 ng/mL (ref 0.10–4.00)

## 2019-07-23 MED ORDER — FINASTERIDE 5 MG PO TABS: 5.0000 mg | ORAL_TABLET | Freq: Every day | ORAL | 1 refills | Status: DC

## 2019-07-23 MED ORDER — SILDENAFIL CITRATE 100 MG PO TABS: 50.0000 mg | ORAL_TABLET | Freq: Every day | ORAL | 3 refills | Status: DC | PRN

## 2019-07-23 MED ORDER — FLUTICASONE PROPIONATE 50 MCG/ACT NA SUSP: 2.0000 | Freq: Every day | NASAL | 3 refills | Status: DC

## 2019-07-23 NOTE — Progress Notes (Signed)
Subjective:    Patient ID: Mario Carpenter, male    DOB: February 07, 1956, 63 y.o.   MRN: [id number]  HPI  Patient is a 63 yr old male who presents today for follow up.  HTN- maintained on amlodipine 5mg . Reports occasional mild ankle swelling. He has been working hard on his diet and has lost > 40 pounds which he is pleased about. Wt Readings from Last 3 Encounters:  07/23/19 199 lb (90.3 kg)  07/09/18 247 lb 9.6 oz (112.3 kg)  10/25/17 253 lb 6.4 oz (114.9 kg)     BP Readings from Last 3 Encounters:  07/23/19 126/70  07/09/18 (!) 144/80  10/25/17 126/67    BPH- mainained on flomax and cialis. He reports ongoing nocturia, has some leakage in between voids. Lab Results  Component Value Date   PSA 1.44 07/09/2018   PSA 1.97 04/25/2017   PSA 1.90 12/21/2015   Allergic rhinitis- Repots that he is taking cetirizine, symptoms stable. Continues flonase as well .  ED- reports that cialis alone does not help his ED but he does find benefit from viagra.   Review of Systems   See HPI  Past Medical History:  Diagnosis Date  . Alcoholism ([place])    Resolved  . Arthritis   . Foot pain, right   . History of bronchitis   . History of chicken pox   . Hypertension   . Nocturia   . Psoriasis   . Urinary frequency      Social History   Socioeconomic History  . Marital status: Married    Spouse name: Not on file  . Number of children: Not on file  . Years of education: Not on file  . Highest education level: Not on file  Occupational History  . Not on file  Social Needs  . Financial resource strain: Not on file  . Food insecurity    Worry: Not on file    Inability: Not on file  . Transportation needs    Medical: Not on file    Non-medical: Not on file  Tobacco Use  . Smoking status: Former Smoker    Packs/day: 2.00    Years: 25.00    Pack years: 50.00    Types: Cigarettes  . Smokeless tobacco: Never Used  Substance and Sexual Activity  . Alcohol use: No   Comment: hx of alcoholism; quit 15 years ago   . Drug use: No    Comment: cocaine,marijuana use in past quit 20 years ago   . Sexual activity: Not on file  Lifestyle  . Physical activity    Days per week: Not on file    Minutes per session: Not on file  . Stress: Not on file  Relationships  . Social Herbalist on phone: Not on file    Gets together: Not on file    Attends religious service: Not on file    Active member of club or organization: Not on file    Attends meetings of clubs or organizations: Not on file    Relationship status: Not on file  . Intimate partner violence    Fear of current or ex partner: Not on file    Emotionally abused: Not on file    Physically abused: Not on file    Forced sexual activity: Not on file  Other Topics Concern  . Not on file  Social History Narrative   Works at [employer]   2 sons (one in  [place] and one in [place])   2 stepdaughters   Enjoys golf   Married (second marriage)       Past Surgical History:  Procedure Laterality Date  . APPENDECTOMY    . HERNIA REPAIR    . KNEE SURGERY     right times 2  . 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 WISDOM TOOTH EXTRACTION      Family History  Problem Relation Age of Onset  . Heart disease Mother 82       Deceased  . Heart defect Mother        hx of rheumatic fever, valve replacement  . Diabetes Son        type 1  . Diabetes Father 64       Deceased  . Healthy Brother        x1  . Heart attack Sister        62, stent in LAD  . Hypertension Sister   . Diabetes Sister     No Known Allergies  Current Outpatient Medications on File Prior to Visit  Medication Sig Dispense Refill  . amLODipine (NORVASC) 5 MG tablet TAKE 1 TABLET BY MOUTH   DAILY.  NEEDS OFFICE VISIT  FOR  FURTHER REFILLS. 90 tablet 3  .  aspirin 81 MG tablet Take 81 mg by mouth daily.    . fluticasone (FLONASE) 50 MCG/ACT nasal spray Place 2 sprays into both nostrils daily. 16 g 6  . meloxicam (MOBIC) 15 MG tablet TAKE 1 TABLET BY MOUTH  DAILY 90 tablet 3  . tadalafil (CIALIS) 5 MG tablet TAKE 1 TABLET BY MOUTH EVERY DAY 30 tablet 2  . tamsulosin (FLOMAX) 0.4 MG CAPS capsule TAKE 1 CAPSULE BY MOUTH  DAILY 90 capsule 3   No current facility-administered medications on file prior to visit.     BP 126/70 (BP Location: Right Arm, Patient Position: Sitting, Cuff Size: Normal)   Pulse (!) 59   Temp (!) 96.9 F (36.1 C) (Temporal)   Resp 16   Ht 6\' 2"  (1.88 m)   Wt 199 lb (90.3 kg)   SpO2 99%   BMI 25.55 kg/m        Objective:   Physical Exam Constitutional:      General: He is not in acute distress.    Appearance: He is well-developed.  HENT:     Head: Normocephalic and atraumatic.  Cardiovascular:     Rate and Rhythm: Normal rate and regular rhythm.     Heart sounds: No murmur.  Pulmonary:     Effort: Pulmonary effort is normal. No respiratory distress.     Breath sounds: Normal breath sounds. No wheezing or rales.  Skin:    General: Skin is warm and dry.  Neurological:     Mental Status: He is alert and oriented to person, place, and time.  Psychiatric:        Behavior: Behavior normal.        Thought Content: Thought content normal.           Assessment & Plan:  HTN- bp stable on current dose of amlodipine. Commended pt on his weight loss.  ED- uncontrolled. D/c cialis,start prn viagra.  BPH- uncontrolled.  D/c daily cialis, trial of proscar.  Allergic rhinitis- stable on zyrtec and flonase, continue same.

## 2019-07-23 NOTE — Patient Instructions (Signed)
Stop Cialis. Start Proscar. You may use viagra as needed. Complete lab work prior to leaving.

## 2019-08-22 ENCOUNTER — Other Ambulatory Visit: Payer: Self-pay | Admitting: Family

## 2019-09-06 DIAGNOSIS — D039 Melanoma in situ, unspecified: Secondary | ICD-10-CM | POA: Insufficient documentation

## 2019-09-06 HISTORY — DX: Melanoma in situ, unspecified: D03.9

## 2019-09-30 ENCOUNTER — Telehealth: Payer: Self-pay | Admitting: Family

## 2019-09-30 NOTE — Telephone Encounter (Signed)
Patient called in states he transferred to [facility] for his Pharmacy needs. Pt. Would like all meds sent to this pharmacy for filling.  Patient is in need of : meloxicam (MOBIC) 15 MG tablet [id number]    amLODipine (NORVASC) 5 MG tablet [id number]    tamsulosin (FLOMAX) 0.4 MG CAPS capsule [id number]    [facility] - [id number] Telephone # [phone]  Fax # [phone]

## 2019-10-02 ENCOUNTER — Other Ambulatory Visit: Payer: Self-pay

## 2019-10-02 MED ORDER — TAMSULOSIN HCL 0.4 MG PO CAPS: 0.4000 mg | ORAL_CAPSULE | Freq: Every day | ORAL | 3 refills | Status: DC

## 2019-10-02 MED ORDER — MELOXICAM 15 MG PO TABS: 15.0000 mg | ORAL_TABLET | Freq: Every day | ORAL | 3 refills | Status: DC

## 2019-10-02 MED ORDER — AMLODIPINE BESYLATE 5 MG PO TABS: ORAL_TABLET | ORAL | 3 refills | Status: DC

## 2019-10-02 NOTE — Telephone Encounter (Signed)
All medications cancelled at [facility] and re order at [facility] at patient's request

## 2019-11-16 ENCOUNTER — Ambulatory Visit: Payer: Self-pay | Attending: Internal Medicine

## 2019-11-16 DIAGNOSIS — Z23 Encounter for immunization: Secondary | ICD-10-CM

## 2019-11-16 NOTE — Progress Notes (Signed)
   Covid-19 Vaccination Clinic  Name:  Luxton Salonen    MRN: [id number] DOB: November 11, 1957  11/16/2019  Mr. Forcucci was observed post Covid-19 immunization for 15 minutes without incident. He was provided with Vaccine Information Sheet and instruction to access the V-Safe system.   Mr. Vanginkel was instructed to call 911 with any severe reactions post vaccine: Marland Kitchen Difficulty breathing  . Swelling of face and throat  . A fast heartbeat  . A bad rash all over body  . Dizziness and weakness   Immunizations Administered    Name Date Dose VIS Date Route   Pfizer COVID-19 Vaccine 11/16/2019  9:22 AM 0.3 mL 08/16/2019 Intramuscular   Manufacturer: [employer]   Lot: [id number]   [place]: [id number]

## 2019-12-09 ENCOUNTER — Ambulatory Visit: Payer: Self-pay

## 2019-12-09 ENCOUNTER — Ambulatory Visit: Payer: Self-pay | Attending: Internal Medicine

## 2019-12-09 DIAGNOSIS — Z23 Encounter for immunization: Secondary | ICD-10-CM

## 2019-12-09 NOTE — Progress Notes (Signed)
   Covid-19 Vaccination Clinic  Name:  Mario Carpenter    MRN: [id number] DOB: 01/06/1958  12/09/2019  Mario Carpenter was observed post Covid-19 immunization for 15 minutes without incident. He was provided with Vaccine Information Sheet and instruction to access the V-Safe system.   Mario Carpenter was instructed to call 911 with any severe reactions post vaccine: Marland Kitchen Difficulty breathing  . Swelling of face and throat  . A fast heartbeat  . A bad rash all over body  . Dizziness and weakness   Immunizations Administered    Name Date Dose VIS Date Route   Pfizer COVID-19 Vaccine 12/09/2019  2:55 PM 0.3 mL 08/16/2019 Intramuscular   Manufacturer: [place]   Lot: [id number]   [place]: [id number]

## 2020-05-20 ENCOUNTER — Other Ambulatory Visit: Payer: Self-pay | Admitting: Family

## 2020-07-31 ENCOUNTER — Other Ambulatory Visit: Payer: Self-pay | Admitting: Family

## 2020-10-03 ENCOUNTER — Other Ambulatory Visit: Payer: Self-pay | Admitting: Family

## 2020-10-12 ENCOUNTER — Other Ambulatory Visit: Payer: Self-pay | Admitting: Family

## 2020-10-13 ENCOUNTER — Other Ambulatory Visit: Payer: Self-pay | Admitting: Family

## 2020-10-13 NOTE — Telephone Encounter (Signed)
Okay to refuse? Pt has not been seen since 2020 and no pending appointments.

## 2020-10-26 ENCOUNTER — Other Ambulatory Visit: Payer: Self-pay | Admitting: Family

## 2020-11-13 ENCOUNTER — Other Ambulatory Visit: Payer: Self-pay | Admitting: Family

## 2020-11-15 NOTE — Telephone Encounter (Signed)
Pt is due for follow up. Please contact pt to schedule.

## 2020-11-16 NOTE — Telephone Encounter (Signed)
appt scheduled with pt

## 2020-11-18 ENCOUNTER — Other Ambulatory Visit: Payer: Self-pay | Admitting: Family

## 2020-11-30 ENCOUNTER — Other Ambulatory Visit: Payer: Self-pay

## 2020-11-30 ENCOUNTER — Ambulatory Visit: Payer: Managed Care, Other (non HMO) | Admitting: Family

## 2020-11-30 ENCOUNTER — Encounter: Payer: Self-pay | Admitting: Family

## 2020-11-30 VITALS — BP 124/70 | HR 56 | Temp 98.1°F | Resp 16 | Ht 74.0 in | Wt 209.8 lb

## 2020-11-30 DIAGNOSIS — H9311 Tinnitus, right ear: Secondary | ICD-10-CM

## 2020-11-30 DIAGNOSIS — D039 Melanoma in situ, unspecified: Secondary | ICD-10-CM | POA: Diagnosis not present

## 2020-11-30 DIAGNOSIS — I10 Essential (primary) hypertension: Secondary | ICD-10-CM

## 2020-11-30 DIAGNOSIS — J309 Allergic rhinitis, unspecified: Secondary | ICD-10-CM

## 2020-11-30 DIAGNOSIS — N529 Male erectile dysfunction, unspecified: Secondary | ICD-10-CM | POA: Diagnosis not present

## 2020-11-30 DIAGNOSIS — N401 Enlarged prostate with lower urinary tract symptoms: Secondary | ICD-10-CM | POA: Diagnosis not present

## 2020-11-30 DIAGNOSIS — M1990 Unspecified osteoarthritis, unspecified site: Secondary | ICD-10-CM

## 2020-11-30 DIAGNOSIS — R351 Nocturia: Secondary | ICD-10-CM

## 2020-11-30 LAB — BASIC METABOLIC PANEL
BUN: 22 mg/dL (ref 6–23)
CO2: 28 mEq/L (ref 19–32)
Calcium: 9.7 mg/dL (ref 8.4–10.5)
Chloride: 102 mEq/L (ref 96–112)
Creatinine, Ser: 0.95 mg/dL (ref 0.40–1.50)
GFR: 84.36 mL/min (ref >60.00)
Glucose, Bld: 85 mg/dL (ref 70–99)
Potassium: 4.6 mEq/L (ref 3.5–5.1)
Sodium: 136 mEq/L (ref 135–145)

## 2020-11-30 MED ORDER — TADALAFIL 5 MG PO TABS: 5.0000 mg | ORAL_TABLET | Freq: Every day | ORAL | 11 refills | Status: AC

## 2020-11-30 MED ORDER — MELOXICAM 15 MG PO TBDP: ORAL_TABLET | ORAL | 0 refills | Status: DC

## 2020-11-30 NOTE — Patient Instructions (Addendum)
Please schedule a follow up with urology.  Complete lab work prior to leaving.

## 2020-11-30 NOTE — Progress Notes (Signed)
Subjective:    Patient ID: Mario Carpenter, male    DOB: 1956-02-18, 65 y.o.   MRN: [id number]  HPI  Patient is a 65 yr old male who presents today for follow up.   HTN- maintained on amlodipine 5mg .  BP Readings from Last 3 Encounters:  11/30/20 124/70  07/23/19 126/70  07/09/18 (!) 144/80   BPH- on flomax and proscar (proscar was added last visit). He states that he saw urology who put him back on daily cialis.  Lab Results  Component Value Date   PSA 1.40 07/23/2019   PSA 1.44 07/09/2018   PSA 1.97 04/25/2017   Allergic rhinitis- maintained on otc cetirizine and flonase 2 sprays each nostril once daily.   ED- using cialis 5mg  once daily which "is not helping." He is not using viagra currently.   Doing yoga which is helping back pain. He uses meloxicam 15mg  once daily for his right foot pain.   Reports that he often has cold fingers and more recently some right sided tinnitus. Denies concerns for hearing loss.   Review of Systems See HPI  Past Medical History:  Diagnosis Date  . Alcoholism ([facility])    Resolved  . Arthritis   . Foot pain, right   . History of bronchitis   . History of chicken pox   . Hypertension   . Melanoma in situ [facility]) 2021   left shoulder (Dr. McConnell/Dr. Allyn Kenner).  . Nocturia   . Psoriasis   . Urinary frequency      Social History   Socioeconomic History  . Marital status: Married    Spouse name: Not on file  . Number of children: Not on file  . Years of education: Not on file  . Highest education level: Not on file  Occupational History  . Not on file  Tobacco Use  . Smoking status: Former Smoker    Packs/day: 2.00    Years: 25.00    Pack years: 50.00    Types: Cigarettes  . Smokeless tobacco: Never Used  Vaping Use  . Vaping Use: Never used  Substance and Sexual Activity  . Alcohol use: No    Comment: hx of alcoholism; quit 15 years ago   . Drug use: No    Comment: cocaine,marijuana use in past quit 20 years ago    . Sexual activity: Not on file  Other Topics Concern  . Not on file  Social History Narrative   Works at [employer]   2 sons (one in [place] and one in [place])   2 stepdaughters   Enjoys golf   Married (second marriage)      Social Determinants of Radio broadcast assistant Strain: Not on [employer] Insecurity: Not on file  Transportation Needs: Not on file  Physical Activity: Not on file  Stress: Not on file  Social Connections: Not on file  Intimate Partner Violence: Not on file    Past Surgical History:  Procedure Laterality Date  . APPENDECTOMY    . HERNIA REPAIR    . KNEE SURGERY     right times 2  . 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 WISDOM TOOTH EXTRACTION      Family History  Problem Relation Age of Onset  . Heart disease Mother 95       Deceased  .  Heart defect Mother        hx of rheumatic fever, valve replacement  . Diabetes Son        type 1  . Diabetes Father 30       Deceased  . Healthy Brother        x1  . Heart attack Sister        31, stent in LAD  . Hypertension Sister   . Diabetes Sister     No Known Allergies  Current Outpatient Medications on File Prior to Visit  Medication Sig Dispense Refill  . amLODipine (NORVASC) 5 MG tablet TAKE 1 TABLET DAILY 90 tablet 3  . aspirin 81 MG tablet Take 81 mg by mouth daily.    . finasteride (PROSCAR) 5 MG tablet TAKE 1 TABLET DAILY 30 tablet 0  . fluticasone (FLONASE) 50 MCG/ACT nasal spray Place 2 sprays into both nostrils daily. 48 g 3  . meloxicam (MOBIC) 15 MG tablet TAKE 1 TABLET DAILY 90 tablet 3  . tamsulosin (FLOMAX) 0.4 MG CAPS capsule TAKE 1 CAPSULE DAILY 90 capsule 1   No current facility-administered medications on file prior to visit.    BP 124/70 (BP Location: Right Arm, Patient Position: Sitting, Cuff Size:  Small)   Pulse (!) 56   Temp 98.1 F (36.7 C) (Oral)   Resp 16   Ht 6\' 2"  (1.88 m)   Wt 209 lb 12.8 oz (95.2 kg)   SpO2 100%   BMI 26.94 kg/m       Objective:   Physical Exam Constitutional:      General: He is not in acute distress.    Appearance: He is well-developed.  HENT:     Head: Normocephalic and atraumatic.     Right Ear: Tympanic membrane and ear canal normal.     Left Ear: Tympanic membrane and ear canal normal.  Cardiovascular:     Rate and Rhythm: Normal rate and regular rhythm.     Heart sounds: No murmur heard.     Comments: Bilateral fingers are warm to touch and pink in color with <1 second cap refill.  Pulmonary:     Effort: Pulmonary effort is normal. No respiratory distress.     Breath sounds: Normal breath sounds. No wheezing or rales.  Skin:    General: Skin is warm and dry.  Neurological:     Mental Status: He is alert and oriented to person, place, and time.  Psychiatric:        Behavior: Behavior normal.        Thought Content: Thought content normal.           Assessment & Plan:  HTN- bp is stable on amlodipine 5mg .  Continue same.  ED- uncontrolled on cialis.  I advised pt to follow back up with urology.  BPH- stable on current regimen- management per urology.    Allergic rhinitis- stable. Continue zyrtec 10mg  once daily and flonase 2 sprays each nostril once daily.  Arthritis pain- recommended that he decrease meloxicam to 1/2 tab once daily as needed for pain and then to try to transition to as needed tylenol.  Tinnitus- discuss use of white noise to drown out the ringing. If symptoms worsen or if he develops concerns about hearing loss he will let me know and we will plan referral to ENT.  In regards to his cold fingers- he may have some mild raynaud's syndrome but I don't see any major concerns currently with the circulation in  his fingers. Will monitor.   Melanoma insitu- removed last year and is being followed by dermatology.    This visit occurred during the SARS-CoV-2 public health emergency.  Safety protocols were in place, including screening questions prior to the visit, additional usage of staff PPE, and extensive cleaning of exam room while observing appropriate contact time as indicated for disinfecting solutions.

## 2021-02-08 ENCOUNTER — Ambulatory Visit (INDEPENDENT_AMBULATORY_CARE_PROVIDER_SITE_OTHER): Payer: Medicare Other | Admitting: Family

## 2021-02-08 ENCOUNTER — Other Ambulatory Visit: Payer: Self-pay

## 2021-02-08 VITALS — BP 136/74 | HR 63 | Temp 98.5°F | Resp 16 | Ht 74.0 in | Wt 215.2 lb

## 2021-02-08 DIAGNOSIS — N138 Other obstructive and reflux uropathy: Secondary | ICD-10-CM

## 2021-02-08 DIAGNOSIS — E785 Hyperlipidemia, unspecified: Secondary | ICD-10-CM | POA: Diagnosis not present

## 2021-02-08 DIAGNOSIS — N401 Enlarged prostate with lower urinary tract symptoms: Secondary | ICD-10-CM | POA: Diagnosis not present

## 2021-02-08 DIAGNOSIS — Z23 Encounter for immunization: Secondary | ICD-10-CM

## 2021-02-08 DIAGNOSIS — Z0000 Encounter for general adult medical examination without abnormal findings: Secondary | ICD-10-CM

## 2021-02-08 DIAGNOSIS — N529 Male erectile dysfunction, unspecified: Secondary | ICD-10-CM

## 2021-02-08 DIAGNOSIS — D039 Melanoma in situ, unspecified: Secondary | ICD-10-CM

## 2021-02-08 DIAGNOSIS — I10 Essential (primary) hypertension: Secondary | ICD-10-CM

## 2021-02-08 DIAGNOSIS — E7800 Pure hypercholesterolemia, unspecified: Secondary | ICD-10-CM

## 2021-02-08 LAB — PSA: PSA: 1.09 ng/mL (ref 0.10–4.00)

## 2021-02-08 MED ORDER — SHINGRIX 50 MCG/0.5ML IM SUSR: INTRAMUSCULAR | 1 refills | Status: DC

## 2021-02-08 NOTE — Assessment & Plan Note (Signed)
He continues routine skin checks with dermatology.

## 2021-02-08 NOTE — Progress Notes (Signed)
Subjective:    Mario Carpenter is a 65 y.o. male who presents for his Welcome to Medicare preventive examination.   Preventive Screening-Counseling & Management  Tobacco Social History   Tobacco Use  Smoking Status Former Smoker  . Packs/day: 2.00  . Years: 25.00  . Pack years: 50.00  . Types: Cigarettes  Smokeless Tobacco Never Used    Problems Prior to Visit  1. BPH- maintained on proscar 5mg , flomax 0.4mg , cialis 5mg .   Lab Results  Component Value Date   PSA 1.40 07/23/2019   PSA 1.44 07/09/2018   PSA 1.97 04/25/2017     2. Hyperlipidemia- not on statin.  Lab Results  Component Value Date   CHOL 174 07/09/2018   HDL 51.30 07/09/2018   LDLCALC 113 (H) 07/09/2018   TRIG 49.0 07/09/2018   CHOLHDL 3 07/09/2018   3. HTN-  Maintained on Amlodipine 5mg .  BP Readings from Last 3 Encounters:  02/08/21 136/74  11/30/20 124/70  07/23/19 126/70   4. Hx of Melanoma- He sees Dr. Jarvis Newcomer for regular skin checks and is up to date.   Current Problems (verified) Patient Active Problem List   Diagnosis Date Noted  . Melanoma in situ ([place]) 2021  . Left arm pain 04/29/2017  . Erectile dysfunction 12/21/2015  . HTN (hypertension) 12/08/2014  . BPH with obstruction/lower urinary tract symptoms 09/22/2014  . Routine general medical examination at a health care facility 09/20/2013  . Cervical disc disease 03/24/2013  . Annual physical exam 09/16/2012  . Pure hypercholesterolemia 11/11/2010  . Psoriasis 11/11/2010  . Chronic pain in right foot 11/11/2010    Medications Prior to Visit Current Outpatient Medications on File Prior to Visit  Medication Sig Dispense Refill  . amLODipine (NORVASC) 5 MG tablet TAKE 1 TABLET DAILY 90 tablet 3  . aspirin 81 MG tablet Take 81 mg by mouth daily.    . finasteride (PROSCAR) 5 MG tablet TAKE 1 TABLET DAILY 30 tablet 0  . fluticasone (FLONASE) 50 MCG/ACT nasal spray Place 2 sprays into both nostrils daily. 48 g 3  .  meloxicam (MOBIC) 15 MG tablet TAKE 1 TABLET DAILY 90 tablet 3  . Meloxicam 15 MG TBDP 1/2 tab by mouth once daily as needed for foot pain. 30 tablet 0  . tadalafil (CIALIS) 5 MG tablet Take 1 tablet (5 mg total) by mouth daily at 6 (six) AM. 30 tablet 11  . tamsulosin (FLOMAX) 0.4 MG CAPS capsule TAKE 1 CAPSULE DAILY 90 capsule 1   No current facility-administered medications on file prior to visit.    Current Medications (verified) Current Outpatient Medications  Medication Sig Dispense Refill  . amLODipine (NORVASC) 5 MG tablet TAKE 1 TABLET DAILY 90 tablet 3  . aspirin 81 MG tablet Take 81 mg by mouth daily.    . finasteride (PROSCAR) 5 MG tablet TAKE 1 TABLET DAILY 30 tablet 0  . fluticasone (FLONASE) 50 MCG/ACT nasal spray Place 2 sprays into both nostrils daily. 48 g 3  . meloxicam (MOBIC) 15 MG tablet TAKE 1 TABLET DAILY 90 tablet 3  . Meloxicam 15 MG TBDP 1/2 tab by mouth once daily as needed for foot pain. 30 tablet 0  . tadalafil (CIALIS) 5 MG tablet Take 1 tablet (5 mg total) by mouth daily at 6 (six) AM. 30 tablet 11  . tamsulosin (FLOMAX) 0.4 MG CAPS capsule TAKE 1 CAPSULE DAILY 90 capsule 1   No current facility-administered medications for this visit.  Allergies (verified) Patient has no known allergies.   PAST HISTORY  Family History Family History  Problem Relation Age of Onset  . Heart disease Mother 62       Deceased  . Heart defect Mother        hx of rheumatic fever, valve replacement  . Diabetes Son        type 1  . Diabetes Father 27       Deceased  . Healthy Brother        x1  . Heart attack Sister        46, stent in LAD  . Hypertension Sister   . Diabetes Sister     Social History Social History   Tobacco Use  . Smoking status: Former Smoker    Packs/day: 2.00    Years: 25.00    Pack years: 50.00    Types: Cigarettes  . Smokeless tobacco: Never Used  Substance Use Topics  . Alcohol use: No    Comment: hx of alcoholism; quit 15  years ago     Are there smokers in your home (other than you)?  no  Risk Factors Current exercise habits: yoga, bike, works around the house, [employer]  Dietary issues discussed: healthy  Wt Readings from Last 3 Encounters:  02/08/21 215 lb 3.2 oz (97.6 kg)  11/30/20 209 lb 12.8 oz (95.2 kg)  07/23/19 199 lb (90.3 kg)      Cardiac risk factors: advanced age (older than 57 for men, 49 for women), dyslipidemia and male gender.  Depression Screen (Note: if answer to either of the following is "Yes", a more complete depression screening is indicated)   Q1: Over the past two weeks, have you felt down, depressed or hopeless? No  Q2: Over the past two weeks, have you felt little interest or pleasure in doing things? No  Have you lost interest or pleasure in daily life? No  Do you often feel hopeless? No  Do you cry easily over simple problems? No  Activities of Daily Living In your present state of health, do you have any difficulty performing the following activities?:  Driving? No Managing money?  No Feeding yourself? No Getting from bed to chair? No Climbing a flight of stairs? No Preparing food and eating?: No Bathing or showering? No Getting dressed: No Getting to the toilet? No Using the toilet:No Moving around from place to place: No In the past year have you fallen or had a near fall?:No   Are you sexually active?  Yes  Do you have more than one partner?  No  Hearing Difficulties: No Do you often ask people to speak up or repeat themselves? No Do you experience ringing or noises in your ears? Yes Do you have difficulty understanding soft or whispered voices? No   Do you feel that you have a problem with memory? No  Do you often misplace items? No  Do you feel safe at home?  Yes  Cognitive Testing  Alert? Yes  Normal Appearance?Yes  Oriented to person? Yes  Place? Yes   Time? Yes  Recall of three objects?  Yes  Can perform simple calculations? Yes  Displays  appropriate judgment?Yes  Can read the correct time from a watch face?Yes   Advanced Directives have been discussed with the patient? Yes   List the Names of Other Physician/Practitioners you currently use: 1.  Jarvis Newcomer 2.  Dahari Rolena Infante  Indicate any recent Medical Services you may have received from  other than [facility] providers in the past year (date may be approximate).  Immunization History  Administered Date(s) Administered  . Influenza, Seasonal, Injecte, Preservative Fre 09/12/2012  . Influenza,inj,Quad PF,6+ Mos 07/26/2015, 07/22/2016, 07/09/2018, 07/03/2019  . PFIZER(Purple Top)SARS-COV-2 Vaccination 11/16/2019, 12/09/2019  . Tdap 09/20/2013    Screening Tests Health Maintenance  Topic Date Due  . HIV Screening  Never done  . Hepatitis C Screening  Never done  . Zoster Vaccines- Shingrix (1 of 2) Never done  . COVID-19 Vaccine (3 - Booster for Pfizer series) 05/10/2020  . PNA vac Low Risk Adult (1 of 2 - PCV13) Never done  . INFLUENZA VACCINE  04/05/2021  . TETANUS/TDAP  09/21/2023  . COLONOSCOPY (Pts 45-60yrs Insurance coverage will need to be confirmed)  01/14/2027  . Pneumococcal Vaccine 47-15 Years old  Aged Out  . HPV VACCINES  Aged Out    Hearing Screening   125Hz  250Hz  500Hz  1000Hz  2000Hz  3000Hz  4000Hz  6000Hz  8000Hz   Right ear:           Left ear:             Visual Acuity Screening   Right eye Left eye Both eyes  Without correction: 20/15 20/15 20/15   With correction:        All answers were reviewed with the patient and necessary referrals were made:  Nance Pear, NP   02/08/2021   History reviewed: allergies, current medications, past family history, past medical history, past social history, past surgical history and problem list  Review of Systems Pertinent items are noted in HPI.    Objective:     Physical Exam  Constitutional: He is oriented to person, place, and time. He appears well-developed and well-nourished. No distress.   HENT:  Head: Normocephalic and atraumatic.  Right Ear: Tympanic membrane and ear canal normal.  Left Ear: Tympanic membrane and ear canal normal.  Mouth/Throat: not examined- pt wearing mask. Eyes: Pupils are equal, round, and reactive to light. No scleral icterus.  Neck: Normal range of motion. No thyromegaly present.  Cardiovascular: Normal rate and regular rhythm.   No murmur heard. Pulmonary/Chest: Effort normal and breath sounds normal. No respiratory distress. He has no wheezes. He has no rales. He exhibits no tenderness.  Abdominal: Soft. Bowel sounds are normal. He exhibits no distension and no mass. There is no tenderness. There is no rebound and no guarding.  Musculoskeletal: He exhibits no edema.  Lymphadenopathy:    He has no cervical adenopathy.  Neurological: He is alert and oriented to person, place, and time. He has normal patellar reflexes. He exhibits normal muscle tone. Coordination normal.  Skin: Skin is warm and dry.  Psychiatric: He has a normal mood and affect. His behavior is normal. Judgment and thought content normal.           Assessment & Plan:    Assessment:      Plan:     During the course of the visit the patient was educated and counseled about appropriate screening and preventive services including:    Pneumococcal vaccine   Td vaccine  Colorectal cancer screening  Advanced directives: has no up to date advanced directive- gave him [place] paperwork  Diet review for nutrition referral? Yes ____  Not Indicated _x___   Patient Instructions (the written plan) was given to the patient.  Medicare Attestation I have personally reviewed: The patient's medical and social history Their use of alcohol, tobacco or illicit drugs Their current medications and supplements  The patient's functional ability including ADLs,fall risks, home safety risks, cognitive, and hearing and visual impairment Diet and physical activities Evidence for depression  or mood disorders  The patient's weight, height, BMI, and visual acuity have been recorded in the chart.  I have made referrals, counseling, and provided education to the patient based on review of the above and I have provided the patient with a written personalized care plan for preventive services.     Nance Pear, NP   02/08/2021

## 2021-02-08 NOTE — Assessment & Plan Note (Signed)
BP Readings from Last 3 Encounters:  02/08/21 136/74  11/30/20 124/70  07/23/19 126/70   BP is stable, continue amlodipine 5mg .

## 2021-02-08 NOTE — Assessment & Plan Note (Signed)
Currently stable.  He continues proscar 5mg , flomax 0.4mg , cialis 5mg .

## 2021-02-08 NOTE — Assessment & Plan Note (Signed)
States that his urologist gave him some viagra. He does not take cialis on the days he uses viagra.

## 2021-02-08 NOTE — Assessment & Plan Note (Signed)
Obtain lipid panel. Not on statin.

## 2021-02-08 NOTE — Addendum Note (Signed)
Addended by: Jiles Prows on: 02/08/2021 11:49 AM   Modules accepted: Orders

## 2021-02-09 ENCOUNTER — Other Ambulatory Visit (HOSPITAL_BASED_OUTPATIENT_CLINIC_OR_DEPARTMENT_OTHER): Payer: Self-pay

## 2021-02-09 ENCOUNTER — Encounter: Payer: Self-pay | Admitting: Family

## 2021-02-09 LAB — COMPREHENSIVE METABOLIC PANEL
ALT: 13 U/L (ref 0–53)
AST: 17 U/L (ref 0–37)
Albumin: 4.3 g/dL (ref 3.5–5.2)
Alkaline Phosphatase: 43 U/L (ref 39–117)
BUN: 21 mg/dL (ref 6–23)
CO2: 28 mEq/L (ref 19–32)
Calcium: 9.7 mg/dL (ref 8.4–10.5)
Chloride: 104 mEq/L (ref 96–112)
Creatinine, Ser: 1.16 mg/dL (ref 0.40–1.50)
GFR: 66.29 mL/min (ref >60.00)
Glucose, Bld: 83 mg/dL (ref 70–99)
Potassium: 4.7 mEq/L (ref 3.5–5.1)
Sodium: 139 mEq/L (ref 135–145)
Total Bilirubin: 1.1 mg/dL (ref 0.2–1.2)
Total Protein: 6.5 g/dL (ref 6.0–8.3)

## 2021-02-09 LAB — LIPID PANEL
Cholesterol: 187 mg/dL (ref 0–200)
HDL: 58.5 mg/dL (ref >39.00)
LDL Cholesterol: 117 mg/dL — ABNORMAL HIGH (ref 0–99)
NonHDL: 128.16
Total CHOL/HDL Ratio: 3
Triglycerides: 55 mg/dL (ref 0.0–149.0)
VLDL: 11 mg/dL (ref 0.0–40.0)

## 2021-02-09 MED ORDER — NIRMATRELVIR/RITONAVIR (PAXLOVID)TABLET
3.0000 | ORAL_TABLET | Freq: Two times a day (BID) | ORAL | 0 refills | Status: AC
  Filled 2021-02-09: qty 30, 5d supply, fill #0

## 2021-02-09 NOTE — Telephone Encounter (Signed)
After Hours Call: 02/09/2021 8am  caller was in the office yesterday today and he tested positive for covid today and just wanted to let the office know caller thinks it may be the shot he received while in the office

## 2021-02-09 NOTE — Telephone Encounter (Signed)
Talked to patient and he confirms covid symptoms started yesterday evening and got worse during the night. Sore throat, fever and body aches. Patient advised Lenna Sciara will send oral medication for his covid symptoms. Rod Holler CMA

## 2021-07-06 ENCOUNTER — Ambulatory Visit (INDEPENDENT_AMBULATORY_CARE_PROVIDER_SITE_OTHER): Payer: Medicare Other | Admitting: Family

## 2021-07-06 ENCOUNTER — Telehealth: Payer: Self-pay | Admitting: Family

## 2021-07-06 ENCOUNTER — Other Ambulatory Visit: Payer: Self-pay

## 2021-07-06 VITALS — BP 139/72 | HR 57 | Temp 97.8°F | Resp 16 | Wt 218.0 lb

## 2021-07-06 DIAGNOSIS — D649 Anemia, unspecified: Secondary | ICD-10-CM

## 2021-07-06 DIAGNOSIS — J0100 Acute maxillary sinusitis, unspecified: Secondary | ICD-10-CM | POA: Diagnosis not present

## 2021-07-06 DIAGNOSIS — R002 Palpitations: Secondary | ICD-10-CM

## 2021-07-06 LAB — CBC WITH DIFFERENTIAL/PLATELET
Basophils Absolute: 0 10*3/uL (ref 0.0–0.1)
Basophils Relative: 0.9 % (ref 0.0–3.0)
Eosinophils Absolute: 0.2 10*3/uL (ref 0.0–0.7)
Eosinophils Relative: 5 % (ref 0.0–5.0)
HCT: 38.9 % — ABNORMAL LOW (ref 39.0–52.0)
Hemoglobin: 12.9 g/dL — ABNORMAL LOW (ref 13.0–17.0)
Lymphocytes Relative: 33.5 % (ref 12.0–46.0)
Lymphs Abs: 1.6 10*3/uL (ref 0.7–4.0)
MCHC: 33.3 g/dL (ref 30.0–36.0)
MCV: 89.5 fl (ref 78.0–100.0)
Monocytes Absolute: 0.7 10*3/uL (ref 0.1–1.0)
Monocytes Relative: 13.4 % — ABNORMAL HIGH (ref 3.0–12.0)
Neutro Abs: 2.3 10*3/uL (ref 1.4–7.7)
Neutrophils Relative %: 47.2 % (ref 43.0–77.0)
Platelets: 220 10*3/uL (ref 150.0–400.0)
RBC: 4.35 Mil/uL (ref 4.22–5.81)
RDW: 13.4 % (ref 11.5–15.5)
WBC: 4.9 10*3/uL (ref 4.0–10.5)

## 2021-07-06 LAB — BASIC METABOLIC PANEL
BUN: 16 mg/dL (ref 6–23)
CO2: 28 mEq/L (ref 19–32)
Calcium: 9.2 mg/dL (ref 8.4–10.5)
Chloride: 104 mEq/L (ref 96–112)
Creatinine, Ser: 0.95 mg/dL (ref 0.40–1.50)
GFR: 84 mL/min (ref >60.00)
Glucose, Bld: 89 mg/dL (ref 70–99)
Potassium: 4.3 mEq/L (ref 3.5–5.1)
Sodium: 138 mEq/L (ref 135–145)

## 2021-07-06 LAB — TSH: TSH: 2.07 u[IU]/mL (ref 0.35–5.50)

## 2021-07-06 MED ORDER — MULTI-VITAMIN/MINERALS PO TABS: 1.0000 | ORAL_TABLET | Freq: Every day | ORAL | Status: AC

## 2021-07-06 NOTE — Assessment & Plan Note (Signed)
Clinically resolved following treatment with amoxicillin. Monitor.

## 2021-07-06 NOTE — Progress Notes (Signed)
Subjective:   By signing my name below, I, Lyric Barr-McArthur, attest that this documentation has been prepared under the direction and in the presence of [name], NP, 07/06/2021   Patient ID: Mario Carpenter, male    DOB: [birth year], 65 y.o.   MRN: [id number]  Chief Complaint  Patient presents with   Sinus Problem    Finished amoxicillin yesterday, feeling better    HPI Patient is in today for an office visit.  Cold sore: He complains of a cold sore that appeared about a month ago that lasted 10 days. He notes when he gets a cold sore, he just overall feels unwell. Ear ache: He notes that he had ear pain and some tooth pain the last few weeks. He went to see another provider, and they prescribed his amoxicillin. He notes that his symptoms have increased since and he is feeling better. He also notes a fever but did not give an exact degree.  Covid-19: He tested for Covid-19 yesterday and his results were negative.  Heart racing: He mentions that he experiences episodes of his heart racing. He denies any dizziness, episodes of syncope, or chest pain associated with these palpitations.   Health Maintenance Due  Topic Date Due   Zoster Vaccines- Shingrix (1 of 2) Never done   COVID-19 Vaccine (3 - Booster for Pfizer series) 02/03/2020   INFLUENZA VACCINE  04/05/2021    Past Medical History:  Diagnosis Date   Alcoholism ([place])    Resolved   Arthritis    Foot pain, right    History of bronchitis    History of chicken pox    Hypertension    Melanoma in situ ([place]) 2021   left shoulder (Dr. Charm Barges. [facility]).   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   Procedure: LEFT TOTAL HIP ARTHROPLASTY ANTERIOR APPROACH;  Surgeon: Gaynelle Arabian, MD;  Location: [facility];  Service: Orthopedics;   Laterality: Left;   WISDOM TOOTH EXTRACTION      Family History  Problem Relation Age of Onset   Heart disease Mother 13       Deceased   Heart defect Mother        hx of rheumatic fever, valve replacement   Diabetes Son        type 1   Diabetes Father 65       Deceased   Healthy Brother        x1   Heart attack Sister        16, stent in LAD   Hypertension Sister    Diabetes Sister     Social History   Socioeconomic History   Marital status: Married    Spouse name: Not on file   Number of children: Not on file   Years of education: Not on file   Highest education level: Not on file  Occupational History   Not on file  Tobacco Use   Smoking status: Former    Packs/day: 2.00    Years: 25.00    Pack years: 50.00    Types: Cigarettes   Smokeless tobacco: Never  Vaping Use   Vaping Use: Never used  Substance and Sexual Activity   Alcohol use: No    Comment:  hx of alcoholism; quit 15 years ago    Drug use: No    Comment: cocaine,marijuana use in past quit 20 years ago    Sexual activity: Not on file  Other Topics Concern   Not on file  Social History Narrative   Works at [employer]   2 sons (one in [place] and one in [place])   2 stepdaughters   Enjoys golf   Married (second marriage)      Social Determinants of Radio broadcast assistant Strain: Not on Art therapist Insecurity: Not on file  Transportation Needs: Not on file  Physical Activity: Not on file  Stress: Not on file  Social Connections: Not on file  Intimate Partner Violence: Not on file    Outpatient Medications Prior to Visit  Medication Sig Dispense Refill   amLODipine (NORVASC) 5 MG tablet TAKE 1 TABLET DAILY 90 tablet 3   aspirin 81 MG tablet Take 81 mg by mouth daily.     finasteride (PROSCAR) 5 MG tablet TAKE 1 TABLET DAILY 30 tablet 0   fluticasone (FLONASE) 50 MCG/ACT nasal spray Place 2 sprays into both nostrils daily. 48 g 3   Meloxicam 15 MG TBDP 1/2 tab by mouth  once daily as needed for foot pain. 30 tablet 0   tadalafil (CIALIS) 5 MG tablet Take 1 tablet (5 mg total) by mouth daily at 6 (six) AM. 30 tablet 11   tamsulosin (FLOMAX) 0.4 MG CAPS capsule TAKE 1 CAPSULE DAILY 90 capsule 1   Zoster Vaccine Adjuvanted [facility]) injection Inject 0.89mcg IM now and then repeat in 2-6 months 0.5 mL 1   meloxicam (MOBIC) 15 MG tablet TAKE 1 TABLET DAILY 90 tablet 3   No facility-administered medications prior to visit.    No Known Allergies  Review of Systems  Constitutional:  Positive for fever (with his ear and tooth pain) and weight loss.  HENT:  Positive for congestion and ear pain. Negative for sinus pain and sore throat.   Respiratory:  Negative for cough.   Cardiovascular:  Positive for palpitations. Negative for chest pain.  Neurological:  Negative for dizziness.      Objective:    Physical Exam Constitutional:      General: He is not in acute distress.    Appearance: Normal appearance. He is not ill-appearing.  HENT:     Head: Normocephalic and atraumatic.     Right Ear: Tympanic membrane, ear canal and external ear normal.     Left Ear: Tympanic membrane, ear canal and external ear normal.     Nose:     Comments: (-) sinus tenderness Eyes:     Extraocular Movements: Extraocular movements intact.     Pupils: Pupils are equal, round, and reactive to light.  Cardiovascular:     Rate and Rhythm: Normal rate and regular rhythm.     Heart sounds: Normal heart sounds. No murmur heard.   No gallop.  Pulmonary:     Effort: Pulmonary effort is normal. No respiratory distress.     Breath sounds: Normal breath sounds. No wheezing or rales.  Lymphadenopathy:     Cervical: No cervical adenopathy.  Skin:    General: Skin is warm and dry.  Neurological:     Mental Status: He is alert and oriented to person, place, and time.  Psychiatric:        Behavior: Behavior normal.        Judgment: Judgment normal.    BP 139/72 (BP  Location: Right  Arm, Patient Position: Sitting, Cuff Size: Small)   Pulse (!) 57   Temp 97.8 F (36.6 C) (Oral)   Resp 16   Wt 218 lb (98.9 kg)   SpO2 100%   BMI 27.99 kg/m  Wt Readings from Last 3 Encounters:  07/06/21 218 lb (98.9 kg)  02/08/21 215 lb 3.2 oz (97.6 kg)  11/30/20 209 lb 12.8 oz (95.2 kg)       Assessment & Plan:   Problem List Items Addressed This Visit       Unprioritized   Palpitations - Primary    New. EKG tracing is personally reviewed.  EKG notes NSR.  No acute changes.   He is advised to go to the ER if worsening symptoms or chest pain.        Relevant Orders   Basic metabolic panel   CBC with Differential/Platelet   TSH   Ambulatory referral to Cardiology   EKG 12-Lead (Completed)   Acute maxillary sinusitis    Clinically resolved following treatment with amoxicillin. Monitor.       No orders of the defined types were placed in this encounter.   I, [name], NP, personally preformed the services described in this documentation.  All medical record entries made by the scribe were at my direction and in my presence.  I have reviewed the chart and discharge instructions (if applicable) and agree that the record reflects my personal performance and is accurate and complete. 07/06/2021  I,Lyric Barr-McArthur,acting as a Education administrator for Nance Pear, NP.,have documented all relevant documentation on the behalf of Nance Pear, NP,as directed by  Nance Pear, NP while in the presence of Nance Pear, NP.  Nance Pear, NP

## 2021-07-06 NOTE — Patient Instructions (Signed)
Please complete lab work prior to leaving.   

## 2021-07-06 NOTE — Telephone Encounter (Signed)
He is very mildly anemic which is new for him.  I would like him to add a multivitamin with minerals once daily and also complete an IFOB dx anemia.   Has he been having any bleeding from his hemorrhoids?

## 2021-07-06 NOTE — Assessment & Plan Note (Signed)
New. EKG tracing is personally reviewed.  EKG notes NSR.  No acute changes.   He is advised to go to the ER if worsening symptoms or chest pain.

## 2021-07-07 NOTE — Telephone Encounter (Signed)
Lvm for patient to call back about results and need for ifob. Test ordered as future, pack left at front for pick up.  Needs to be advised to start multivitamins with minerals

## 2021-07-08 NOTE — Telephone Encounter (Signed)
Patient advised of results, provider's comments and new multivitamin supplement. He will come and pick up IFOB kit.   FYI He reports having on and off pain and occasional bleeding with hemorrhoids.

## 2021-07-12 NOTE — Addendum Note (Signed)
Addended by: Kelle Darting A on: 07/12/2021 10:58 AM   Modules accepted: Orders

## 2021-07-13 ENCOUNTER — Other Ambulatory Visit (INDEPENDENT_AMBULATORY_CARE_PROVIDER_SITE_OTHER): Payer: Medicare Other

## 2021-07-13 DIAGNOSIS — D649 Anemia, unspecified: Secondary | ICD-10-CM | POA: Diagnosis not present

## 2021-07-13 LAB — FECAL OCCULT BLOOD, IMMUNOCHEMICAL: Fecal Occult Bld: NEGATIVE

## 2021-08-04 DIAGNOSIS — R350 Frequency of micturition: Secondary | ICD-10-CM | POA: Insufficient documentation

## 2021-08-04 DIAGNOSIS — M1990 Unspecified osteoarthritis, unspecified site: Secondary | ICD-10-CM | POA: Insufficient documentation

## 2021-08-04 DIAGNOSIS — F1020 Alcohol dependence, uncomplicated: Secondary | ICD-10-CM | POA: Insufficient documentation

## 2021-08-04 DIAGNOSIS — Z8709 Personal history of other diseases of the respiratory system: Secondary | ICD-10-CM | POA: Insufficient documentation

## 2021-08-04 DIAGNOSIS — Z8619 Personal history of other infectious and parasitic diseases: Secondary | ICD-10-CM | POA: Insufficient documentation

## 2021-08-04 DIAGNOSIS — R351 Nocturia: Secondary | ICD-10-CM | POA: Insufficient documentation

## 2021-08-11 NOTE — Progress Notes (Signed)
Cardiology Office Note:    Date:  08/12/2021   ID:  Mario Carpenter, DOB 03/24/1956, MRN [id number]  PCP:  [name], NP  Cardiologist:  Shirlee More, MD   Referring MD: [name], NP  ASSESSMENT:    1. Palpitations   2. Primary hypertension    PLAN:    In order of problems listed above:  He has recently noticed more frequent palpitation not severe or sustained is caused apprehension regarding underlying heart disease.  He has no obvious precipitating event.  We will apply a 7-day event monitor to assess whether he has significant arrhythmia and if reassuring follow-up in the future as needed I asked him to sign up with my chart so he could communicate with me. Stable continue current treatment calcium channel blocker  Next appointment as needed in the future unless he has abnormality in the event monitor   Medication Adjustments/Labs and Tests Ordered: Current medicines are reviewed at length with the patient today.  Concerns regarding medicines are outlined above.  Orders Placed This Encounter  Procedures   LONG TERM MONITOR (3-14 DAYS)   No orders of the defined types were placed in this encounter.    Chief Complaint  Patient presents with   Palpitations    Recently have noticed heart during the day    History of Present Illness:    Mario Carpenter is a 65 y.o. male who is being seen today for the evaluation of palpitation at the request of [name], NP.  His EKG 07/06/2021 shows sinus bradycardia 56 bpm first-degree AV block  He has a background history of intermittent awareness of his heart beating brief momentary and occur in the evening when at rest. No obvious precipitant. Recently has occurred more frequently does occur during the day and has made him concerned about the potential of heart disease He has no family history of arrhythmia sudden cardiac death or cardiomyopathy but his mother had rheumatic heart disease  she had valve replacement at a young age and died at age 8 of complications of her valvular heart disease He does Spa installation and service vigorous activity and has no exercise intolerance shortness of breath chest pain or syncope. The episodes are momentary and occur few times a week. He takes no over-the-counter proarrhythmic drugs He was instructed in avoiding these and given recommendations for what to use He has had a good outpatient evaluation his potassium and TSH are normal 4.6 and 1.20 He has no history of congenital rheumatic heart disease or heart murmur. Past Medical History:  Diagnosis Date   Alcoholism [facility])    Resolved   Arthritis    Foot pain, right    History of bronchitis    History of chicken pox    Hypertension    Melanoma in situ [facility]) 2021   left shoulder (Dr. Charm Barges. [facility]).   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   Procedure: LEFT TOTAL HIP ARTHROPLASTY ANTERIOR APPROACH;  Surgeon: Gaynelle Arabian, MD;  Location: [facility];  Service: Orthopedics;  Laterality: Left;   WISDOM TOOTH EXTRACTION      Current Medications: Current Meds  Medication Sig   amLODipine (NORVASC) 5 MG tablet TAKE 1 TABLET DAILY   aspirin 81  MG tablet Take 81 mg by mouth daily.   Meloxicam 15 MG TBDP 1/2 tab by mouth once daily as needed for foot pain.   Multiple Vitamins-Minerals (MULTIVITAMIN WITH MINERALS) tablet Take 1 tablet by mouth daily.   sildenafil (VIAGRA) 100 MG tablet Take 100 mg by mouth daily as needed for erectile dysfunction.   tadalafil (CIALIS) 5 MG tablet Take 1 tablet (5 mg total) by mouth daily at 6 (six) AM.   tamsulosin (FLOMAX) 0.4 MG CAPS capsule TAKE 1 CAPSULE DAILY   Zoster Vaccine Adjuvanted [facility]) injection Inject 0.6mcg IM now and then repeat in 2-6 months      Allergies:   Patient has no known allergies.   Social History   Socioeconomic History   Marital status: Married    Spouse name: Not on file   Number of children: Not on file   Years of education: Not on file   Highest education level: Not on file  Occupational History   Not on file  Tobacco Use   Smoking status: Former    Packs/day: 2.00    Years: 25.00    Pack years: 50.00    Types: Cigarettes    Passive exposure: Current   Smokeless tobacco: Never  Vaping Use   Vaping Use: Never used  Substance and Sexual Activity   Alcohol use: No    Comment: hx of alcoholism; quit 15 years ago    Drug use: No    Comment: cocaine,marijuana use in past quit 20 years ago    Sexual activity: Not on file  Other Topics Concern   Not on file  Social History Narrative   Works at everything [employer]   2 sons (one in [place] and one in [place])   2 stepdaughters   Enjoys golf   Married (second marriage)      Social Determinants of Radio broadcast assistant Strain: Not on Art therapist Insecurity: Not on file  Transportation Needs: Not on file  Physical Activity: Not on file  Stress: Not on file  Social Connections: Not on file     Family History: The patient's family history includes Diabetes in his sister and son; Diabetes (age of onset: 53) in his father; Healthy in his brother; Heart attack in his sister; Heart defect in his mother; Heart disease (age of onset: 26) in his mother; Hypertension in his sister.  ROS:   ROS Please see the history of present illness.     All other systems reviewed and are negative.  EKGs/Labs/Other Studies Reviewed:    The following studies were reviewed today:   EKG:  EKG is  ordered today.  The ekg ordered today is personally reviewed and demonstrates sinus rhythm and is normal  Recent Labs: 02/08/2021: ALT 13 07/06/2021: BUN 16; Creatinine, Ser 0.95; Hemoglobin 12.9; Platelets 220.0; Potassium 4.3; Sodium 138; TSH 2.07  Recent Lipid  Panel    Component Value Date/Time   CHOL 187 02/08/2021 0941   TRIG 55.0 02/08/2021 0941   HDL 58.50 02/08/2021 0941   CHOLHDL 3 02/08/2021 0941   VLDL 11.0 02/08/2021 0941   LDLCALC 117 (H) 02/08/2021 0941    Physical Exam:    VS:  BP 138/82   Pulse 68   Ht 6\' 2"  (1.88 m)   Wt 224 lb 1.9 oz (101.7 kg)   SpO2 98%   BMI 28.78 kg/m     Wt Readings from Last 3 Encounters:  08/12/21 224 lb 1.9 oz (101.7 kg)  07/06/21  218 lb (98.9 kg)  02/08/21 215 lb 3.2 oz (97.6 kg)     GEN: Appears his age looks healthy well nourished, well developed in no acute distress HEENT: Normal NECK: No JVD; No carotid bruits LYMPHATICS: No lymphadenopathy CARDIAC: RRR, no murmurs, rubs, gallops RESPIRATORY:  Clear to auscultation without rales, wheezing or rhonchi  ABDOMEN: Soft, non-tender, non-distended MUSCULOSKELETAL:  No edema; No deformity  SKIN: Warm and dry NEUROLOGIC:  Alert and oriented x 3 PSYCHIATRIC:  Normal affect     Signed, Shirlee More, MD  08/12/2021 8:48 AM    [facility]

## 2021-08-12 ENCOUNTER — Ambulatory Visit (INDEPENDENT_AMBULATORY_CARE_PROVIDER_SITE_OTHER): Payer: Medicare Other

## 2021-08-12 ENCOUNTER — Ambulatory Visit (INDEPENDENT_AMBULATORY_CARE_PROVIDER_SITE_OTHER): Payer: Medicare Other | Admitting: Cardiology

## 2021-08-12 ENCOUNTER — Other Ambulatory Visit: Payer: Self-pay

## 2021-08-12 ENCOUNTER — Encounter: Payer: Self-pay | Admitting: Cardiology

## 2021-08-12 VITALS — BP 138/82 | HR 68 | Ht 74.0 in | Wt 224.1 lb

## 2021-08-12 DIAGNOSIS — R002 Palpitations: Secondary | ICD-10-CM | POA: Diagnosis not present

## 2021-08-12 DIAGNOSIS — I10 Essential (primary) hypertension: Secondary | ICD-10-CM

## 2021-08-12 NOTE — Patient Instructions (Addendum)
Medication Instructions:  Your physician recommends that you continue on your current medications as directed. Please refer to the Current Medication list given to you today.  *If you need a refill on your cardiac medications before your next appointment, please call your pharmacy*   Lab Work: None If you have labs (blood work) drawn today and your tests are completely normal, you will receive your results only by: [place] (if you have MyChart) OR A paper copy in the mail If you have any lab test that is abnormal or we need to change your treatment, we will call you to review the results.   Testing/Procedures: A zio monitor was ordered today. It will remain on for 7 days. You will then return monitor and event diary in provided box. It takes 1-2 weeks for report to be downloaded and returned to [place]. We will call you with the results. If monitor falls off or has orange flashing light, please call Zio for further instructions.     Follow-Up: At [facility], you and your health needs are our priority.  As part of our continuing mission to provide you with exceptional heart care, we have created designated Provider Care Teams.  These Care Teams include your primary Cardiologist (physician) and Advanced Practice Providers (APPs -  Physician Assistants and Nurse Practitioners) who all work together to provide you with the care you need, when you need it.  We recommend signing up for the patient portal called "MyChart".  Sign up information is provided on this After Visit Summary.  MyChart is used to connect with patients for Virtual Visits (Telemedicine).  Patients are able to view lab/test results, encounter notes, upcoming appointments, etc.  Non-urgent messages can be sent to your provider as well.   To learn more about what you can do with MyChart, go to [email or web address].    Your next appointment:   As needed  The format for your next appointment:   In Person  Provider:    Shirlee More, MD Shirlee More, MD    Other Instructions 1. Avoid all over-the-counter antihistamines except Claritin/Loratadine and Zyrtec/Cetrizine. 2. Avoid all combination including cold sinus allergies flu decongestant and sleep medications 3. You can use Robitussin DM Mucinex and Mucinex DM for cough. 4. can use Tylenol aspirin ibuprofen and naproxen but no combinations such as sleep or sinus.

## 2021-08-27 ENCOUNTER — Telehealth: Payer: Self-pay

## 2021-08-27 NOTE — Telephone Encounter (Signed)
-----   Message from Richardo Priest, MD sent at 08/27/2021 11:34 AM EST ----- Good result monitor is normal

## 2021-08-27 NOTE — Telephone Encounter (Signed)
Spoke with patient regarding results and recommendation.  Patient verbalizes understanding and is agreeable to plan of care. Advised patient to call back with any issues or concerns.  

## 2021-10-11 ENCOUNTER — Other Ambulatory Visit: Payer: Self-pay | Admitting: Family

## 2021-11-10 ENCOUNTER — Other Ambulatory Visit: Payer: Self-pay | Admitting: Family

## 2021-12-07 ENCOUNTER — Ambulatory Visit: Payer: Medicare Other | Admitting: Family Medicine

## 2021-12-08 ENCOUNTER — Encounter: Payer: Self-pay | Admitting: Family Medicine

## 2021-12-08 ENCOUNTER — Ambulatory Visit (INDEPENDENT_AMBULATORY_CARE_PROVIDER_SITE_OTHER): Payer: Medicare Other | Admitting: Family Medicine

## 2021-12-08 VITALS — BP 132/61 | HR 62 | Ht 74.0 in | Wt 225.2 lb

## 2021-12-08 DIAGNOSIS — M25512 Pain in left shoulder: Secondary | ICD-10-CM

## 2021-12-08 DIAGNOSIS — M25562 Pain in left knee: Secondary | ICD-10-CM | POA: Diagnosis not present

## 2021-12-08 NOTE — Progress Notes (Signed)
? ?Acute Office Visit ? ?Subjective:  ? ? Patient ID: Mario Carpenter, male    DOB: 05/24/1956, 66 y.o.   MRN: [id number] ? ?CC: shoulder and knee pain ? ? ?HPI ?Patient is in today for shoulder and knee pain.  ? ? ?LEFT SHOULDER PAIN: ?Onset: 3 weeks or so, no specific injury ?Location: L anterior shoulder ?Duration: consistently with certain movements ?Characteristics: stabbing sensation ?Aggravating factors: pulling blanket over in bed, abduction  ?Alleviating factors: resting  ?Radiating/associated symptoms: occasionally some radiation into arm with pain/tingling ?Timing: with activity  ?Severity: at rest 1/10; up to 6/10 with activity  ? ? ?LEFT KNEE PAIN: ?Onset: a few months  ?Location: posterior left knee pain ?Duration: constant  ?Characteristics: dull, uncomfortable, felt swollen at one point but not now ?Aggravating factors: bending/flexion ?Alleviating factors: rest  ?Radiating/associated symptoms: no ?Timing: constant  ?Severity: just uncomfortable  ? ?Currently denies any edema, weakness, altered ROM, numbness, tingling, skin changes.  ? ?Takes meloxicam (7.5 mg) daily (for awhile for chronic foot pain). Not seeming to help.  ? ? ?Past Medical History:  ?Diagnosis Date  ? 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 Resolved  ? Arthritis   ? Foot pain, right   ? History of bronchitis   ? History of chicken pox   ? Hypertension   ? Melanoma in situ [facility]) 2021  ? left shoulder (Dr. McConnell/Dr. Allyn Kenner).  ? Nocturia   ? Psoriasis   ? Urinary frequency   ? ? ?Past Surgical History:  ?Procedure Laterality Date  ? APPENDECTOMY    ? HERNIA REPAIR    ? KNEE SURGERY    ? right times 2  ? 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 Procedure: LEFT TOTAL HIP ARTHROPLASTY ANTERIOR APPROACH;  Surgeon: Gaynelle Arabian, MD;  Location: [facility];  Service: Orthopedics;  Laterality: Left;  ? WISDOM TOOTH EXTRACTION    ? ? ?Family History  ?Problem Relation Age of Onset  ? Heart disease Mother 49  ?     Deceased  ? Heart defect Mother   ?      hx of rheumatic fever, valve replacement  ? Diabetes Son   ?     type 1  ? Diabetes Father 73  ?     Deceased  ? Healthy Brother   ?     x1  ? Heart attack Sister   ?     64, stent in LAD  ? Hypertension Sister   ? Diabetes Sister   ? ? ?Social History  ? ?Socioeconomic History  ? Marital status: Married  ?  Spouse name: Not on file  ? Number of children: Not on file  ? Years of education: Not on file  ? Highest education level: Not on file  ?Occupational History  ? Not on file  ?Tobacco Use  ? Smoking status: Former  ?  Packs/day: 2.00  ?  Years: 25.00  ?  Pack years: 50.00  ?  Types: Cigarettes  ?  Passive exposure: Current  ? Smokeless tobacco: Never  ?Vaping Use  ? Vaping Use: Never used  ?Substance and Sexual Activity  ? Alcohol use: No  ?  Comment: hx of alcoholism; quit 15 years ago   ? Drug use: No  ?  Comment: cocaine,marijuana use in past quit 20 years ago   ? Sexual activity: Not on file  ?Other Topics Concern  ? Not on file  ?Social History Narrative  ? Works at [employer]  ?  2 sons (one in [place] and one in [place])  ? 2 stepdaughters  ? Enjoys golf  ? Married (second marriage)  ?   ? ?Social Determinants of Health  ? ?Financial Resource Strain: Not on file  ?Food Insecurity: Not on file  ?Transportation Needs: Not on file  ?Physical Activity: Not on file  ?Stress: Not on file  ?Social Connections: Not on file  ?Intimate Partner Violence: Not on file  ? ? ?Outpatient Medications Prior to Visit  ?Medication Sig Dispense Refill  ? amLODipine (NORVASC) 5 MG tablet TAKE 1 TABLET BY MOUTH EVERY DAY 90 tablet 0  ? aspirin 81 MG tablet Take 81 mg by mouth daily.    ? finasteride (PROSCAR) 5 MG tablet TAKE 1 TABLET DAILY (Patient not taking: Reported on 08/12/2021) 30 tablet 0  ? Meloxicam 15 MG TBDP 1/2 tab by mouth once daily as needed for foot pain. 30 tablet 0  ? Multiple Vitamins-Minerals (MULTIVITAMIN WITH MINERALS) tablet Take 1 tablet by mouth daily.    ? sildenafil (VIAGRA) 100 MG tablet  Take 100 mg by mouth daily as needed for erectile dysfunction.    ? tadalafil (CIALIS) 5 MG tablet Take 1 tablet (5 mg total) by mouth daily at 6 (six) AM. 30 tablet 11  ? tamsulosin (FLOMAX) 0.4 MG CAPS capsule TAKE 1 TABLET BY MOUTH EVERY DAY 90 capsule 0  ? Zoster Vaccine Adjuvanted [facility]) injection Inject 0.48mg IM now and then repeat in 2-6 months 0.5 mL 1  ? ?No facility-administered medications prior to visit.  ? ? ?No Known Allergies ? ?Review of Systems ?All review of systems negative except what is listed in the HPI ? ?   ?Objective:  ?  ?Physical Exam ?Vitals reviewed.  ?Constitutional:   ?   Appearance: Normal appearance.  ?Musculoskeletal:     ?   General: No swelling, tenderness, deformity or signs of injury. Normal range of motion.  ?   Comments: L shoulder: +Hawkins, negative Neers and Apley scratch test; full ROM, no weakness ?L knee: normal ligament testing, no instability, no edema  ?Skin: ?   General: Skin is warm and dry.  ?   Findings: No bruising or erythema.  ?Neurological:  ?   General: No focal deficit present.  ?   Mental Status: He is alert and oriented to person, place, and time. Mental status is at baseline.  ?Psychiatric:     ?   Mood and Affect: Mood normal.     ?   Behavior: Behavior normal.     ?   Thought Content: Thought content normal.     ?   Judgment: Judgment normal.  ? ? ?There were no vitals taken for this visit. ?Wt Readings from Last 3 Encounters:  ?08/12/21 224 lb 1.9 oz (101.7 kg)  ?07/06/21 218 lb (98.9 kg)  ?02/08/21 215 lb 3.2 oz (97.6 kg)  ? ? ?Health Maintenance Due  ?Topic Date Due  ? Zoster Vaccines- Shingrix (1 of 2) Never done  ? COVID-19 Vaccine (3 - Pfizer risk series) 01/06/2020  ? Pneumonia Vaccine 66 Years old (2 - PCV) 02/08/2022  ? ? ?There are no preventive care reminders to display for this patient. ? ? ?Lab Results  ?Component Value Date  ? TSH 2.07 07/06/2021  ? ?Lab Results  ?Component Value Date  ? WBC 4.9 07/06/2021  ? HGB 12.9 (L) 07/06/2021   ? HCT 38.9 (L) 07/06/2021  ? MCV 89.5 07/06/2021  ? PLT 220.0 07/06/2021  ? ?  Lab Results  ?Component Value Date  ? NA 138 07/06/2021  ? K 4.3 07/06/2021  ? CO2 28 07/06/2021  ? GLUCOSE 89 07/06/2021  ? BUN 16 07/06/2021  ? CREATININE 0.95 07/06/2021  ? BILITOT 1.1 02/08/2021  ? ALKPHOS 43 02/08/2021  ? AST 17 02/08/2021  ? ALT 13 02/08/2021  ? PROT 6.5 02/08/2021  ? ALBUMIN 4.3 02/08/2021  ? CALCIUM 9.2 07/06/2021  ? ANIONGAP 7 08/13/2016  ? GFR 84.00 07/06/2021  ? ?Lab Results  ?Component Value Date  ? CHOL 187 02/08/2021  ? ?Lab Results  ?Component Value Date  ? HDL 58.50 02/08/2021  ? ?Lab Results  ?Component Value Date  ? LDLCALC 117 (H) 02/08/2021  ? ?Lab Results  ?Component Value Date  ? TRIG 55.0 02/08/2021  ? ?Lab Results  ?Component Value Date  ? CHOLHDL 3 02/08/2021  ? ?Lab Results  ?Component Value Date  ? HGBA1C 5.8 01/20/2017  ? ? ?   ?Assessment & Plan:  ? ?1. Acute pain of left shoulder - consider impingement ?2. Acute pain of left knee ?Patient declined imaging today. States he does feel like he is making progress and is open to starting conservative management. ?For the next 2-3 weeks, take a full tablet of your meloxicam (15 mg daily), rest, ice, heat, elevation ?Home exercises provided ?If not improving in 3-4 weeks, we can consider xrays, sports medicine, and physical therapy.  ? ?Please contact office for follow-up if symptoms do not improve or worsen. Seek emergency care if symptoms become severe. ? ? ?Terrilyn Saver, NP ? ?

## 2021-12-08 NOTE — Patient Instructions (Signed)
For shoulder and knee pain: ?For the next 2-3 weeks, take a full tablet of your meloxicam, rest, ice, heat, elevation ?Home exercises provided ?If not improving in 3-4 weeks, we can consider xrays, sports medicine, and physical therapy.  ?

## 2022-01-09 ENCOUNTER — Other Ambulatory Visit: Payer: Self-pay | Admitting: Family

## 2022-02-04 ENCOUNTER — Other Ambulatory Visit: Payer: Self-pay | Admitting: Family

## 2022-02-07 ENCOUNTER — Other Ambulatory Visit: Payer: Self-pay | Admitting: Family

## 2022-02-28 ENCOUNTER — Ambulatory Visit (INDEPENDENT_AMBULATORY_CARE_PROVIDER_SITE_OTHER): Payer: Medicare Other

## 2022-02-28 VITALS — Ht 74.0 in | Wt 219.0 lb

## 2022-02-28 DIAGNOSIS — Z0000 Encounter for general adult medical examination without abnormal findings: Secondary | ICD-10-CM | POA: Diagnosis not present

## 2022-02-28 NOTE — Progress Notes (Signed)
Subjective:   Mario Carpenter is a 66 y.o. male who presents for an Initial Medicare Annual Wellness Visit.  I connected with Mario Carpenter today by telephone and verified that I am speaking with the correct person using two identifiers. Location patient: home Location provider: work Persons participating in the virtual visit: patient, Engineer, civil (consulting).    I discussed the limitations, risks, security and privacy concerns of performing an evaluation and management service by telephone and the availability of in person appointments. I also discussed with the patient that there may be a patient responsible charge related to this service. The patient expressed understanding and verbally consented to this telephonic visit.    Interactive audio and video telecommunications were attempted between this provider and patient, however failed, due to patient having technical difficulties OR patient did not have access to video capability.  We continued and completed visit with audio only.  Some vital signs may be absent or patient reported.   Time Spent with patient on telephone encounter: 20 minutes   Review of Systems     Cardiac Risk Factors include: advanced age (>47men, >55 women);male gender;hypertension     Objective:    Today's Vitals   02/28/22 0831  Weight: 219 lb (99.3 kg)  Height: 6\' 2"  (1.88 m)   Body mass index is 28.12 kg/m.     02/28/2022    8:34 AM 01/13/2017    7:35 AM 08/12/2016    8:30 PM 08/12/2016   12:56 PM 08/04/2016   10:07 AM  Advanced Directives  Does Patient Have a Medical Advance Directive? Yes Yes Yes Yes No  Type of Estate agent of [place];Living will Healthcare Power of [place];Living will Healthcare Power of [employer] Power of Attorney   Does patient want to make changes to medical advance directive?   No - Patient declined    Copy of Healthcare Power of Attorney in Chart? Yes - validated most recent copy scanned in chart  (See row information) No - copy requested Yes Yes   Would patient like information on creating a medical advance directive?   No - Patient declined  Yes (MAU/Ambulatory/Procedural Areas - Information given)    Current Medications (verified) Outpatient Encounter Medications as of 02/28/2022  Medication Sig   amLODipine (NORVASC) 5 MG tablet TAKE ONE TABLET BY MOUTH ONE TIME DAILY   aspirin 81 MG tablet Take 81 mg by mouth daily.   finasteride (PROSCAR) 5 MG tablet TAKE 1 TABLET DAILY   meloxicam (MOBIC) 15 MG tablet TAKE ONE TABLET BY MOUTH ONE TIME DAILY   Multiple Vitamins-Minerals (MULTIVITAMIN WITH MINERALS) tablet Take 1 tablet by mouth daily.   sildenafil (VIAGRA) 100 MG tablet Take 100 mg by mouth daily as needed for erectile dysfunction.   tadalafil (CIALIS) 5 MG tablet Take 1 tablet (5 mg total) by mouth daily at 6 (six) AM.   tamsulosin (FLOMAX) 0.4 MG CAPS capsule TAKE ONE CAPSULE BY MOUTH ONE TIME DAILY   Zoster Vaccine Adjuvanted [facility]) injection Inject 0.16mcg IM now and then repeat in 2-6 months (Patient not taking: Reported on 02/28/2022)   No facility-administered encounter medications on file as of 02/28/2022.    Allergies (verified) Patient has no known allergies.   History: Past Medical History:  Diagnosis Date   Alcoholism ([facility])    Resolved   Arthritis    Foot pain, right    History of bronchitis    History of chicken pox    Hypertension    Melanoma in  situ [facility]) 2021   left shoulder (Dr. McConnell/Dr. Nita Sells).   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   Procedure: LEFT TOTAL HIP ARTHROPLASTY ANTERIOR APPROACH;  Surgeon: Ollen Gross, MD;  Location: [facility];  Service: Orthopedics;  Laterality: Left;   WISDOM TOOTH EXTRACTION     Family History  Problem Relation Age of Onset   Heart  disease Mother 42       Deceased   Heart defect Mother        hx of rheumatic fever, valve replacement   Diabetes Son        type 1   Diabetes Father 31       Deceased   Healthy Brother        x1   Heart attack Sister        21, stent in LAD   Hypertension Sister    Diabetes Sister    Social History   Socioeconomic History   Marital status: Married    Spouse name: Not on file   Number of children: Not on file   Years of education: Not on file   Highest education level: Not on file  Occupational History   Not on file  Tobacco Use   Smoking status: Former    Packs/day: 2.00    Years: 25.00    Total pack years: 50.00    Types: Cigarettes    Passive exposure: Current   Smokeless tobacco: Never  Vaping Use   Vaping Use: Never used  Substance and Sexual Activity   Alcohol use: No    Comment: hx of alcoholism; quit 15 years ago    Drug use: No    Comment: cocaine,marijuana use in past quit 20 years ago    Sexual activity: Not on file  Other Topics Concern   Not on file  Social History Narrative   Works at [employer]   2 sons (one in [place] and one in [place])   2 stepdaughters   Enjoys golf   Married (second marriage)      Social Determinants of Health   Financial Resource Strain: Low Risk  (02/28/2022)   Overall Financial Resource Strain (CARDIA)    Difficulty of Paying Living Expenses: Not hard at all  Food Insecurity: No Food Insecurity (02/28/2022)   Hunger Vital Sign    Worried About Running Out of Food in the Last Year: Never true    Ran Out of Food in the Last Year: Never true  Transportation Needs: No Transportation Needs (02/28/2022)   PRAPARE - Administrator, Civil Service (Medical): No    Lack of Transportation (Non-Medical): No  Physical Activity: Sufficiently Active (02/28/2022)   Exercise Vital Sign    Days of Exercise per Week: 4 days    Minutes of Exercise per Session: 60 min  Stress: No Stress Concern Present  (02/28/2022)   [employer] of Occupational Health - Occupational Stress Questionnaire    Feeling of Stress : Not at all  Social Connections: Moderately Integrated (02/28/2022)   Social Connection and Isolation Panel [NHANES]    Frequency of Communication with Friends and Family: More than three times a week    Frequency of Social Gatherings with Friends and Family: More than three  times a week    Attends Religious Services: 1 to 4 times per year    Active Member of Clubs or Organizations: No    Attends Banker Meetings: Never    Marital Status: Married    Tobacco Counseling Counseling given: Not Answered   Clinical Intake:  Pre-visit preparation completed: Yes  Pain : No/denies pain     BMI - recorded: 28.12 Nutritional Status: BMI 25 -29 Overweight Nutritional Risks: None Diabetes: No  How often do you need to have someone help you when you read instructions, pamphlets, or other written materials from your doctor or pharmacy?: 1 - Never  Diabetic?No  Interpreter Needed?: No  Information entered by :: Thomasenia Sales LPN   Activities of Daily Living    02/28/2022    8:37 AM  In your present state of health, do you have any difficulty performing the following activities:  Hearing? 0  Vision? 0  Difficulty concentrating or making decisions? 0  Walking or climbing stairs? 0  Dressing or bathing? 0  Doing errands, shopping? 0  Preparing Food and eating ? N  Using the Toilet? N  In the past six months, have you accidently leaked urine? N  Do you have problems with loss of bowel control? N  Managing your Medications? N  Managing your Finances? N  Housekeeping or managing your Housekeeping? N    Patient Care Team: Sandford Craze, NP as PCP - General (Internal Medicine) Sandford Craze, NP (Internal Medicine)  Indicate any recent Medical Services you may have received from other than [facility] providers in the past year (date may be  approximate).     Assessment:   This is a routine wellness examination for Potter.  Hearing/Vision screen Hearing Screening - Comments:: No issues Vision Screening - Comments:: Last eye exam-several years ago  Dietary issues and exercise activities discussed: Current Exercise Habits: Home exercise routine, Type of exercise: strength training/weights;stretching;yoga, Time (Minutes): 30, Frequency (Times/Week): 4, Weekly Exercise (Minutes/Week): 120, Intensity: Mild, Exercise limited by: None identified   Goals Addressed             This Visit's Progress    Patient Stated       Would like to lose weight & maybe join the gym       Depression Screen    02/28/2022    8:37 AM 02/08/2021    9:05 AM 11/30/2020    9:27 AM 10/25/2017    9:30 AM 07/22/2016    9:44 AM  PHQ 2/9 Scores  PHQ - 2 Score 0 0 0 0 0  PHQ- 9 Score    3     Fall Risk    02/28/2022    8:35 AM 02/08/2021    9:05 AM 07/22/2016    9:44 AM  Fall Risk   Falls in the past year? 0 0 No  Number falls in past yr: 0 0   Injury with Fall? 0 0   Follow up Falls prevention discussed      FALL RISK PREVENTION PERTAINING TO THE HOME:  Any stairs in or around the home? Yes  If so, are there any without handrails? No  Home free of loose throw rugs in walkways, pet beds, electrical cords, etc? Yes  Adequate lighting in your home to reduce risk of falls? Yes   ASSISTIVE DEVICES UTILIZED TO PREVENT FALLS:  Life alert? No  Use of a cane, walker or w/c? No  Grab bars in the bathroom? No  Shower  chair or bench in shower? No  Elevated toilet seat or a handicapped toilet? No   TIMED UP AND GO:  Was the test performed? No . Phone visit   Cognitive Function:Normal cognitive status assessed by this Nurse Health Advisor. No abnormalities found.          Immunizations Immunization History  Administered Date(s) Administered   Influenza, Seasonal, Injecte, Preservative Fre 09/12/2012   Influenza,inj,Quad PF,6+  Mos 07/26/2015, 07/22/2016, 07/09/2018, 07/03/2019   PFIZER(Purple Top)SARS-COV-2 Vaccination 11/16/2019, 12/09/2019   Pneumococcal Polysaccharide-23 02/08/2021   Tdap 09/20/2013    TDAP status: Up to date  Flu Vaccine status: Up to date  Pneumococcal vaccine status: Up to date  Covid-19 vaccine status: Completed vaccines-specific dates unknown  Qualifies for Shingles Vaccine? Yes   Zostavax completed No   Shingrix Completed?: No.    Education has been provided regarding the importance of this vaccine. Patient has been advised to call insurance company to determine out of pocket expense if they have not yet received this vaccine. Advised may also receive vaccine at local pharmacy or Health Dept. Verbalized acceptance and understanding.  Screening Tests Health Maintenance  Topic Date Due   Zoster Vaccines- Shingrix (1 of 2) Never done   COVID-19 Vaccine (3 - Pfizer risk series) 01/06/2020   Pneumonia Vaccine 3+ Years old (2 - PCV) 02/08/2022   INFLUENZA VACCINE  04/05/2022   TETANUS/TDAP  09/21/2023   COLONOSCOPY (Pts 45-55yrs Insurance coverage will need to be confirmed)  01/14/2027   HPV VACCINES  Aged Out   Hepatitis C Screening  Discontinued    Health Maintenance  Health Maintenance Due  Topic Date Due   Zoster Vaccines- Shingrix (1 of 2) Never done   COVID-19 Vaccine (3 - Pfizer risk series) 01/06/2020   Pneumonia Vaccine 39+ Years old (2 - PCV) 02/08/2022    Colorectal cancer screening: Type of screening: Colonoscopy. Completed 01/13/2017. Repeat every 10 years  Lung Cancer Screening: (Low Dose CT Chest recommended if Age 95-80 years, 30 pack-year currently smoking OR have quit w/in 15years.) does not qualify.    Additional Screening:  Hepatitis C Screening: does qualify; Discuss with PCP  Vision Screening: Recommended annual ophthalmology exams for early detection of glaucoma and other disorders of the eye. Is the patient up to date with their annual eye exam?   No  Who is the provider or what is the name of the office in which the patient attends annual eye exams? Unknown   Dental Screening: Recommended annual dental exams for proper oral hygiene  Community Resource Referral / Chronic Care Management: CRR required this visit?  No   CCM required this visit?  No      Plan:     I have personally reviewed and noted the following in the patient's chart:   Medical and social history Use of alcohol, tobacco or illicit drugs  Current medications and supplements including opioid prescriptions. Patient is not currently taking opioid prescriptions. Functional ability and status Nutritional status Physical activity Advanced directives List of other physicians Hospitalizations, surgeries, and ER visits in previous 12 months Vitals Screenings to include cognitive, depression, and falls Referrals and appointments  In addition, I have reviewed and discussed with patient certain preventive protocols, quality metrics, and best practice recommendations. A written personalized care plan for preventive services as well as general preventive health recommendations were provided to patient.   Due to this being a telephonic visit, the after visit summary with patients personalized plan was offered to patient  via mail or my-chart. Patient would like to access on my-chart.   Roanna Raider, LPN   0/27/2536  Nurse Health Advisor  Nurse Notes: None

## 2022-03-10 ENCOUNTER — Telehealth: Payer: Medicare Other | Admitting: Family Medicine

## 2022-03-10 DIAGNOSIS — B9789 Other viral agents as the cause of diseases classified elsewhere: Secondary | ICD-10-CM | POA: Diagnosis not present

## 2022-03-10 DIAGNOSIS — H9202 Otalgia, left ear: Secondary | ICD-10-CM

## 2022-03-10 DIAGNOSIS — J019 Acute sinusitis, unspecified: Secondary | ICD-10-CM

## 2022-03-10 DIAGNOSIS — J029 Acute pharyngitis, unspecified: Secondary | ICD-10-CM | POA: Diagnosis not present

## 2022-03-10 MED ORDER — AZELASTINE-FLUTICASONE 137-50 MCG/ACT NA SUSP
1.0000 | Freq: Two times a day (BID) | NASAL | 0 refills | Status: DC
Start: 2022-03-10 — End: 2022-09-26

## 2022-03-10 NOTE — Patient Instructions (Signed)

## 2022-03-10 NOTE — Progress Notes (Signed)
Virtual Visit Consent   Mario Carpenter, you are scheduled for a virtual visit with a [facility] provider today. Just as with appointments in the office, your consent must be obtained to participate. Your consent will be active for this visit and any virtual visit you may have with one of our providers in the next 365 days. If you have a MyChart account, a copy of this consent can be sent to you electronically.  As this is a virtual visit, video technology does not allow for your provider to perform a traditional examination. This may limit your provider's ability to fully assess your condition. If your provider identifies any concerns that need to be evaluated in person or the need to arrange testing (such as labs, EKG, etc.), we will make arrangements to do so. Although advances in technology are sophisticated, we cannot ensure that it will always work on either your end or our end. If the connection with a video visit is poor, the visit may have to be switched to a telephone visit. With either a video or telephone visit, we are not always able to ensure that we have a secure connection.  By engaging in this virtual visit, you consent to the provision of healthcare and authorize for your insurance to be billed (if applicable) for the services provided during this visit. Depending on your insurance coverage, you may receive a charge related to this service.  I need to obtain your verbal consent now. Are you willing to proceed with your visit today? Mario Carpenter has provided verbal consent on 03/10/2022 for a virtual visit (video or telephone). Perlie Mayo, NP  Date: 03/10/2022 8:17 AM  Virtual Visit via Video Note   I, Perlie Mayo, connected with  Mario Carpenter  ([id number], 05/15/1956) on 03/10/22 at  8:15 AM EDT by a video-enabled telemedicine application and verified that I am speaking with the correct person using two identifiers.  Location: Patient: Virtual Visit  Location Patient: Home Provider: Virtual Visit Location Provider: Home Office   I discussed the limitations of evaluation and management by telemedicine and the availability of in person appointments. The patient expressed understanding and agreed to proceed.    History of Present Illness: Mario Carpenter is a 66 y.o. who identifies as a male who was assigned male at birth, and is being seen today for sore throat and ear ache.   HPI: Sinus Problem This is a new problem. The current episode started yesterday. The problem has been gradually worsening since onset. There has been no fever. His pain is at a severity of 5/10. The pain is moderate. Associated symptoms include ear pain and a sore throat. Pertinent negatives include no chills, congestion, coughing, diaphoresis, headaches, hoarse voice, neck pain, shortness of breath, sinus pressure, sneezing or swollen glands. (Left side ) Treatments tried: cold meds. The treatment provided mild relief.    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   Routine general medical examination at a health care facility 09/20/2013   Cervical disc disease  03/24/2013   Annual physical exam 09/16/2012   Pure hypercholesterolemia 11/11/2010   Psoriasis 11/11/2010   Chronic pain in right foot 11/11/2010    Allergies: No Known Allergies Medications:  Current Outpatient Medications:    amLODipine (NORVASC) 5 MG tablet, TAKE ONE TABLET BY MOUTH ONE TIME DAILY, Disp: 90 tablet, Rfl: 0   aspirin 81 MG tablet,  Take 81 mg by mouth daily., Disp: , Rfl:    finasteride (PROSCAR) 5 MG tablet, TAKE 1 TABLET DAILY, Disp: 30 tablet, Rfl: 0   meloxicam (MOBIC) 15 MG tablet, TAKE ONE TABLET BY MOUTH ONE TIME DAILY, Disp: 30 tablet, Rfl: 0   Multiple Vitamins-Minerals (MULTIVITAMIN WITH MINERALS) tablet, Take 1 tablet by mouth daily., Disp: , Rfl:    sildenafil (VIAGRA) 100 MG tablet, Take 100 mg by mouth daily as needed for erectile dysfunction., Disp: , Rfl:    tadalafil (CIALIS) 5 MG tablet, Take 1 tablet (5 mg total) by mouth daily at 6 (six) AM., Disp: 30 tablet, Rfl: 11   tamsulosin (FLOMAX) 0.4 MG CAPS capsule, TAKE ONE CAPSULE BY MOUTH ONE TIME DAILY, Disp: 30 capsule, Rfl: 0   Zoster Vaccine Adjuvanted [facility]) injection, Inject 0.46mg IM now and then repeat in 2-6 months (Patient not taking: Reported on 02/28/2022), Disp: 0.5 mL, Rfl: 1  Observations/Objective: Patient is well-developed, well-nourished in no acute distress.  Resting comfortably  at home.  Head is normocephalic, traumatic.  No labored breathing.  Speech is clear and coherent with logical content.  Patient is alert and oriented at baseline.    Assessment and Plan: 1. Acute viral sinusitis  - Azelastine-Fluticasone 137-50 MCG/ACT SUSP; Place 1 spray into the nose every 12 (twelve) hours.  Dispense: 23 g; Refill: 0  -encouraged to covid test (works in public) -advised to use combo nasal spray to open up tubes and sinuses -advised of when to follow up if worsen or not getting better.   2. Pharyngitis, unspecified etiology   3. Ear pain, left     Reviewed side effects, risks and benefits of medication.    Patient acknowledged agreement and understanding of the plan.   Past Medical, Surgical, Social History, Allergies, and Medications have been Reviewed.    Follow Up Instructions: I discussed the assessment and treatment plan with the patient. The patient was provided an opportunity to ask questions and all were  answered. The patient agreed with the plan and demonstrated an understanding of the instructions.  A copy of instructions were sent to the patient via MyChart unless otherwise noted below.    The patient was advised to call back or seek an in-person evaluation if the symptoms worsen or if the condition fails to improve as anticipated.  Time:  I spent 11 minutes with the patient via telehealth technology discussing the above problems/concerns.    HPerlie Mayo NP

## 2022-03-23 ENCOUNTER — Ambulatory Visit (INDEPENDENT_AMBULATORY_CARE_PROVIDER_SITE_OTHER): Payer: Medicare Other | Admitting: Family

## 2022-03-23 ENCOUNTER — Encounter: Payer: Self-pay | Admitting: Family

## 2022-03-23 VITALS — BP 126/69 | HR 72 | Temp 98.3°F | Resp 16 | Wt 225.0 lb

## 2022-03-23 DIAGNOSIS — N138 Other obstructive and reflux uropathy: Secondary | ICD-10-CM

## 2022-03-23 DIAGNOSIS — M25512 Pain in left shoulder: Secondary | ICD-10-CM

## 2022-03-23 DIAGNOSIS — I10 Essential (primary) hypertension: Secondary | ICD-10-CM | POA: Diagnosis not present

## 2022-03-23 DIAGNOSIS — M75110 Incomplete rotator cuff tear or rupture of unspecified shoulder, not specified as traumatic: Secondary | ICD-10-CM | POA: Insufficient documentation

## 2022-03-23 DIAGNOSIS — N401 Enlarged prostate with lower urinary tract symptoms: Secondary | ICD-10-CM

## 2022-03-23 DIAGNOSIS — D649 Anemia, unspecified: Secondary | ICD-10-CM | POA: Diagnosis not present

## 2022-03-23 DIAGNOSIS — R002 Palpitations: Secondary | ICD-10-CM

## 2022-03-23 DIAGNOSIS — N529 Male erectile dysfunction, unspecified: Secondary | ICD-10-CM | POA: Diagnosis not present

## 2022-03-23 DIAGNOSIS — M545 Low back pain, unspecified: Secondary | ICD-10-CM | POA: Diagnosis not present

## 2022-03-23 LAB — BASIC METABOLIC PANEL
BUN: 19 mg/dL (ref 6–23)
CO2: 28 mEq/L (ref 19–32)
Calcium: 9.4 mg/dL (ref 8.4–10.5)
Chloride: 103 mEq/L (ref 96–112)
Creatinine, Ser: 0.97 mg/dL (ref 0.40–1.50)
GFR: 81.52 mL/min (ref >60.00)
Glucose, Bld: 76 mg/dL (ref 70–99)
Potassium: 4.5 mEq/L (ref 3.5–5.1)
Sodium: 138 mEq/L (ref 135–145)

## 2022-03-23 LAB — CBC WITH DIFFERENTIAL/PLATELET
Basophils Absolute: 0.1 10*3/uL (ref 0.0–0.1)
Basophils Relative: 1.1 % (ref 0.0–3.0)
Eosinophils Absolute: 0.2 10*3/uL (ref 0.0–0.7)
Eosinophils Relative: 3.8 % (ref 0.0–5.0)
HCT: 41.3 % (ref 39.0–52.0)
Hemoglobin: 13.7 g/dL (ref 13.0–17.0)
Lymphocytes Relative: 26.8 % (ref 12.0–46.0)
Lymphs Abs: 1.5 10*3/uL (ref 0.7–4.0)
MCHC: 33.1 g/dL (ref 30.0–36.0)
MCV: 89.4 fl (ref 78.0–100.0)
Monocytes Absolute: 0.6 10*3/uL (ref 0.1–1.0)
Monocytes Relative: 11.1 % (ref 3.0–12.0)
Neutro Abs: 3.3 10*3/uL (ref 1.4–7.7)
Neutrophils Relative %: 57.2 % (ref 43.0–77.0)
Platelets: 252 10*3/uL (ref 150.0–400.0)
RBC: 4.61 Mil/uL (ref 4.22–5.81)
RDW: 13.6 % (ref 11.5–15.5)
WBC: 5.8 10*3/uL (ref 4.0–10.5)

## 2022-03-23 MED ORDER — SHINGRIX 50 MCG/0.5ML IM SUSR: INTRAMUSCULAR | 1 refills | Status: DC

## 2022-03-23 NOTE — Assessment & Plan Note (Signed)
To begin oral steroid treatment per ortho.

## 2022-03-23 NOTE — Assessment & Plan Note (Signed)
Stable on flomax. Continue same.  

## 2022-03-23 NOTE — Assessment & Plan Note (Signed)
Resolved. Cardiac evaluation/monitor was WNL.

## 2022-03-23 NOTE — Assessment & Plan Note (Signed)
BP Readings from Last 3 Encounters:  03/23/22 126/69  12/08/21 132/61  08/12/21 138/82   Stable on amlodipine, continue same.

## 2022-03-23 NOTE — Progress Notes (Signed)
Subjective:   By signing my name below, I, Shehryar Baig, attest that this documentation has been prepared under the direction and in the presence of [name], NP. 03/23/2022    Patient ID: Mario Carpenter, male    DOB: February 05, 1956, 66 y.o.   MRN: [id number]  Chief Complaint  Patient presents with   Hypertension    Hers for follow up    HPI Patient is in today for a office visit.   Shoulder pain- He complains of left shoulder pain. He has a history of rupturing his bicep a couple of years ago. He feels his shoulder pop and catch when lifting his arms up but no pain. His pain worsens when he sleeps on his left side. He is taking tylenol to manage his pain and finds mild relief. He is also taking 7 mg meloxicam to manage his foot pain and finds mild relief for his shoulder pain. He is interested in seeing a sports medicine specialist to manage his symptoms.  Back pain- He complains of back pain since Friday. He is seeing a spine specialist to manage it. Specialist prescribed steroids which he plans to begin. He denies having any hematuria, burning.  Right finger- He continues having episodes of right 2nd digit stiffness. His symptoms typically present in the morning and he finds his completely immobile. His symptoms improve after a couple of hours. He declines further work up of this at this time.  Flomax- He continues taking Flomax and reports doing well while taking it.  Cialis- He takes Cialis when his symptoms worsen and reports he has not had to take it in a while.  Heart palpitations- He continues following up with Dr. Bettina Gavia for his heart palpitations. He continues having occasional heart palpitations at night while laying in bed before sleeping.  Dermatology- He continues following up with his dermatologist since his last melanoma.  Blood pressure- His blood pressure is doing well during this visit. He continues taking 5 mg amlodipine daily PO and reports having easy  bruising while taking it.  BP Readings from Last 3 Encounters:  03/23/22 126/69  12/08/21 132/61  08/12/21 138/82   Pulse Readings from Last 3 Encounters:  03/23/22 72  12/08/21 62  08/12/21 68   Immunizations- He is due for a shingles vaccine and is willing to receive it at his pharmacy if he decides to receive it. He is due for a pneumonia vaccine and is not interested in receiving it.    Health Maintenance Due  Topic Date Due   Zoster Vaccines- Shingrix (1 of 2) Never done   COVID-19 Vaccine (3 - Pfizer risk series) 01/06/2020    Past Medical History:  Diagnosis Date   Alcoholism ([place])    Resolved   Arthritis    Foot pain, right    History of bronchitis    History of chicken pox    Hypertension    Melanoma in situ ([place]) 2021   left shoulder (Dr. Charm Barges. Allyn Kenner).   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   Procedure: LEFT TOTAL HIP ARTHROPLASTY ANTERIOR APPROACH;  Surgeon: Gaynelle Arabian, MD;  Location: [facility];  Service: Orthopedics;  Laterality: Left;   WISDOM TOOTH EXTRACTION  Family History  Problem Relation Age of Onset   Heart disease Mother 48       Deceased   Heart defect Mother        hx of rheumatic fever, valve replacement   Diabetes Son        type 1   Diabetes Father 64       Deceased   Healthy Brother        x1   Heart attack Sister        2, stent in LAD   Hypertension Sister    Diabetes Sister     Social History   Socioeconomic History   Marital status: Married    Spouse name: Not on file   Number of children: Not on file   Years of education: Not on file   Highest education level: Not on file  Occupational History   Not on file  Tobacco Use   Smoking status: Former    Packs/day: 2.00    Years: 25.00    Total pack years: 50.00    Types:  Cigarettes    Passive exposure: Current   Smokeless tobacco: Never  Vaping Use   Vaping Use: Never used  Substance and Sexual Activity   Alcohol use: No    Comment: hx of alcoholism; quit 15 years ago    Drug use: No    Comment: cocaine,marijuana use in past quit 20 years ago    Sexual activity: Not on file  Other Topics Concern   Not on file  Social History Narrative   Works at [employer]   2 sons (one in [place] and one in [place])   2 stepdaughters   Enjoys golf   Married (second marriage)      Social Determinants of Health   Financial Resource Strain: Low Risk  (02/28/2022)   Overall Financial Resource Strain (CARDIA)    Difficulty of Paying Living Expenses: Not hard at all  Food Insecurity: No [place] (02/28/2022)   Hunger Vital Sign    Worried About Running Out of Food in the Last Year: Never true    [place] in the Last Year: Never true  Transportation Needs: No Transportation Needs (02/28/2022)   PRAPARE - Hydrologist (Medical): No    Lack of Transportation (Non-Medical): No  Physical Activity: Sufficiently Active (02/28/2022)   Exercise Vital Sign    Days of Exercise per Week: 4 days    Minutes of Exercise per Session: 60 min  Stress: No Stress Concern Present (02/28/2022)   [place]    Feeling of Stress : Not at all  Social Connections: Moderately Integrated (02/28/2022)   Social Connection and Isolation Panel [NHANES]    Frequency of Communication with Friends and Family: More than three times a week    Frequency of Social Gatherings with Friends and Family: More than three times a week    Attends Religious Services: 1 to 4 times per year    Active Member of [employer] or Organizations: No    Attends Archivist Meetings: Never    Marital Status: Married  Human resources officer Violence: Not At Risk (02/28/2022)   Humiliation, Afraid, Rape, and  Kick questionnaire    Fear of Current or Ex-Partner: No    Emotionally Abused: No    Physically Abused: No    Sexually Abused: No    Outpatient Medications Prior to Visit  Medication Sig  Dispense Refill   amLODipine (NORVASC) 5 MG tablet TAKE ONE TABLET BY MOUTH ONE TIME DAILY 90 tablet 0   aspirin 81 MG tablet Take 81 mg by mouth daily.     Azelastine-Fluticasone 137-50 MCG/ACT SUSP Place 1 spray into the nose every 12 (twelve) hours. 23 g 0   finasteride (PROSCAR) 5 MG tablet TAKE 1 TABLET DAILY 30 tablet 0   meloxicam (MOBIC) 15 MG tablet TAKE ONE TABLET BY MOUTH ONE TIME DAILY 30 tablet 0   Multiple Vitamins-Minerals (MULTIVITAMIN WITH MINERALS) tablet Take 1 tablet by mouth daily.     sildenafil (VIAGRA) 100 MG tablet Take 100 mg by mouth daily as needed for erectile dysfunction.     tadalafil (CIALIS) 5 MG tablet Take 1 tablet (5 mg total) by mouth daily at 6 (six) AM. 30 tablet 11   tamsulosin (FLOMAX) 0.4 MG CAPS capsule TAKE ONE CAPSULE BY MOUTH ONE TIME DAILY 30 capsule 0   Zoster Vaccine Adjuvanted [facility]) injection Inject 0.16mg IM now and then repeat in 2-6 months 0.5 mL 1   No facility-administered medications prior to visit.    No Known Allergies  Review of Systems  Genitourinary:  Negative for dysuria and hematuria.  Musculoskeletal:        (+)left shoulder pain (+)back pain (+)occasional right hand 2nd digit stiffness       Objective:    Physical Exam Constitutional:      General: He is not in acute distress.    Appearance: Normal appearance. He is not ill-appearing.  HENT:     Head: Normocephalic and atraumatic.     Right Ear: External ear normal.     Left Ear: External ear normal.  Eyes:     Extraocular Movements: Extraocular movements intact.     Pupils: Pupils are equal, round, and reactive to light.  Cardiovascular:     Rate and Rhythm: Normal rate and regular rhythm.     Heart sounds: Normal heart sounds. No murmur heard.    No gallop.   Pulmonary:     Effort: Pulmonary effort is normal. No respiratory distress.     Breath sounds: Normal breath sounds. No wheezing or rales.  Musculoskeletal:     Left shoulder: Crepitus present. No tenderness.     Comments: Left shoulder is without tenderness or swelling. Full ROM. Negative Empty Can  Skin:    General: Skin is warm and dry.  Neurological:     Mental Status: He is alert and oriented to person, place, and time.  Psychiatric:        Judgment: Judgment normal.     BP 126/69 (BP Location: Left Arm, Patient Position: Sitting, Cuff Size: Large)   Pulse 72   Temp 98.3 F (36.8 C) (Oral)   Resp 16   Wt 225 lb (102.1 kg)   SpO2 98%   BMI 28.89 kg/m  Wt Readings from Last 3 Encounters:  03/23/22 225 lb (102.1 kg)  02/28/22 219 lb (99.3 kg)  12/08/21 225 lb 3.2 oz (102.2 kg)       Assessment & Plan:   Problem List Items Addressed This Visit       Unprioritized   Palpitations    Resolved. Cardiac evaluation/monitor was WNL.       HTN (hypertension)    BP Readings from Last 3 Encounters:  03/23/22 126/69  12/08/21 132/61  08/12/21 138/82  Stable on amlodipine, continue same.       Relevant Orders   Basic metabolic panel  Erectile dysfunction    Good response with prn cialis.       BPH with obstruction/lower urinary tract symptoms    Stable on flomax.  Continue same.       Anemia    Lab Results  Component Value Date   WBC 4.9 07/06/2021   HGB 12.9 (L) 07/06/2021   HCT 38.9 (L) 07/06/2021   MCV 89.5 07/06/2021   PLT 220.0 07/06/2021  Had Negative IFOB. Will repeat CBC.       Relevant Orders   CBC with Differential/Platelet   Acute pain of left shoulder - Primary   Relevant Orders   Ambulatory referral to Sports Medicine   Acute low back pain without sciatica    To begin oral steroid treatment per ortho.         Meds ordered this encounter  Medications   Zoster Vaccine Adjuvanted [facility]) injection    Sig: Inject 0.18mg IM now  and then repeat in 2-6 months    Dispense:  0.5 mL    Refill:  1    Order Specific Question:   Supervising Provider    Answer:   BPenni HomansA [[id number]]    I, MNance Pear NP, personally preformed the services described in this documentation.  All medical record entries made by the scribe were at my direction and in my presence.  I have reviewed the chart and discharge instructions (if applicable) and agree that the record reflects my personal performance and is accurate and complete. 03/23/2022   I,Shehryar Baig,acting as a sEducation administratorfor MNance Pear NP.,have documented all relevant documentation on the behalf of MNance Pear NP,as directed by  MNance Pear NP while in the presence of MNance Pear NP.   MNance Pear NP

## 2022-03-23 NOTE — Assessment & Plan Note (Signed)
Lab Results  Component Value Date   CHOL 187 02/08/2021   HDL 58.50 02/08/2021   LDLCALC 117 (H) 02/08/2021   TRIG 55.0 02/08/2021   CHOLHDL 3 02/08/2021

## 2022-03-23 NOTE — Assessment & Plan Note (Signed)
Good response with prn cialis.

## 2022-03-23 NOTE — Assessment & Plan Note (Signed)
Lab Results  Component Value Date   WBC 4.9 07/06/2021   HGB 12.9 (L) 07/06/2021   HCT 38.9 (L) 07/06/2021   MCV 89.5 07/06/2021   PLT 220.0 07/06/2021   Had Negative IFOB. Will repeat CBC.

## 2022-04-01 ENCOUNTER — Other Ambulatory Visit: Payer: Self-pay | Admitting: Family

## 2022-04-04 ENCOUNTER — Ambulatory Visit (INDEPENDENT_AMBULATORY_CARE_PROVIDER_SITE_OTHER): Payer: Medicare Other | Admitting: Family Medicine

## 2022-04-04 ENCOUNTER — Ambulatory Visit: Payer: Self-pay

## 2022-04-04 ENCOUNTER — Encounter: Payer: Self-pay | Admitting: Family Medicine

## 2022-04-04 VITALS — BP 140/72 | Ht 74.0 in | Wt 225.0 lb

## 2022-04-04 DIAGNOSIS — M75112 Incomplete rotator cuff tear or rupture of left shoulder, not specified as traumatic: Secondary | ICD-10-CM

## 2022-04-04 DIAGNOSIS — M65331 Trigger finger, right middle finger: Secondary | ICD-10-CM | POA: Diagnosis not present

## 2022-04-04 DIAGNOSIS — M25512 Pain in left shoulder: Secondary | ICD-10-CM

## 2022-04-04 MED ORDER — NITROGLYCERIN 0.2 MG/HR TD PT24: MEDICATED_PATCH | TRANSDERMAL | 11 refills | Status: DC

## 2022-04-04 NOTE — Assessment & Plan Note (Signed)
Acute on chronic in nature.  The anterior leaflet of the supraspinatus has an obvious defect. -Counseled on home exercise therapy and supportive care. -Referral to physical therapy. -Nitro patches. -Could consider shockwave or injection.

## 2022-04-04 NOTE — Progress Notes (Signed)
  Mario Carpenter - 66 y.o. male MRN [id number]  Date of birth: 03/12/1956  SUBJECTIVE:  Including CC & ROS.  No chief complaint on file.   Mario Carpenter is a 66 y.o. male that is presenting with acute on chronic left shoulder pain.  The pain is intermittent in nature.  No recent injury or inciting event.  Localized to the shoulder.   Review of Systems See HPI   HISTORY: Past Medical, Surgical, Social, and Family History Reviewed & Updated per EMR.   Pertinent Historical Findings include:  Past Medical History:  Diagnosis Date   Alcoholism ([place])    Resolved   Arthritis    Foot pain, right    History of bronchitis    History of chicken pox    Hypertension    Melanoma in situ ([place]) 2021   left shoulder (Dr. Charm Barges. Allyn Kenner).   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   Procedure: LEFT TOTAL HIP ARTHROPLASTY ANTERIOR APPROACH;  Surgeon: Gaynelle Arabian, MD;  Location: [facility];  Service: Orthopedics;  Laterality: Left;   WISDOM TOOTH EXTRACTION       PHYSICAL EXAM:  VS: BP 140/72 (BP Location: Right Arm, Patient Position: Sitting, Cuff Size: Normal)   Ht '6\' 2"'$  (1.88 m)   Wt 225 lb (102.1 kg)   BMI 28.89 kg/m  Physical Exam Gen: NAD, alert, cooperative with exam, well-appearing MSK:  Neurovascularly intact    Limited ultrasound: Left shoulder:  Absent biceps tendon within the bicipital groove. Overlying effusion of the subscapularis. The anterior leaflet of the supraspinatus has a full-thickness tear. There is a subacromial bursitis  Summary: Partial complete tear of the supraspinatus  Ultrasound and interpretation by Clearance Coots, MD    ASSESSMENT & PLAN:   Trigger middle finger of right hand Acutely occurring.  Having intermittent symptoms. -Counseled on home exercise  therapy and supportive care. -Provided splint. -Could consider injection.  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  The anterior leaflet of the supraspinatus has an obvious defect. -Counseled on home exercise therapy and supportive care. -Referral to physical therapy. -Nitro patches. -Could consider shockwave or injection.

## 2022-04-04 NOTE — Patient Instructions (Addendum)
Nice to meet you Please try heat on the shoulder  Please try the exercise   I have made a referral to physical therapy  Please send me a message in MyChart with any questions or updates.  Please see me back in 4 weeks.   --Dr. Raeford Razor  Nitroglycerin Protocol  Apply 1/4 nitroglycerin patch to affected area daily. Change position of patch within the affected area every 24 hours. You may experience a headache during the first 1-2 weeks of using the patch, these should subside. If you experience headaches after beginning nitroglycerin patch treatment, you may take your preferred over the counter pain reliever. Another side effect of the nitroglycerin patch is skin irritation or rash related to patch adhesive. Please notify our office if you develop more severe headaches or rash, and stop the patch. Tendon healing with nitroglycerin patch may require 12 to 24 weeks depending on the extent of injury. Men should not use if taking Viagra, Cialis, or Levitra.  Do not use if you have migraines or rosacea.

## 2022-04-04 NOTE — Assessment & Plan Note (Signed)
Acutely occurring.  Having intermittent symptoms. -Counseled on home exercise therapy and supportive care. -Provided splint. -Could consider injection.

## 2022-04-11 ENCOUNTER — Ambulatory Visit: Payer: Medicare Other | Attending: Family Medicine | Admitting: Physical Therapy

## 2022-04-11 ENCOUNTER — Encounter: Payer: Self-pay | Admitting: Physical Therapy

## 2022-04-11 DIAGNOSIS — M25512 Pain in left shoulder: Secondary | ICD-10-CM | POA: Insufficient documentation

## 2022-04-11 DIAGNOSIS — G8929 Other chronic pain: Secondary | ICD-10-CM | POA: Insufficient documentation

## 2022-04-11 DIAGNOSIS — M6281 Muscle weakness (generalized): Secondary | ICD-10-CM | POA: Insufficient documentation

## 2022-04-11 DIAGNOSIS — M75112 Incomplete rotator cuff tear or rupture of left shoulder, not specified as traumatic: Secondary | ICD-10-CM | POA: Insufficient documentation

## 2022-04-11 DIAGNOSIS — R252 Cramp and spasm: Secondary | ICD-10-CM | POA: Insufficient documentation

## 2022-04-11 NOTE — Therapy (Signed)
OUTPATIENT PHYSICAL THERAPY SHOULDER EVALUATION   Patient Name: Mario Carpenter MRN: [id number] DOB:01/21/1956, 66 y.o., male Today's Date: 04/11/2022   PT End of Session - 04/11/22 1410     Visit Number 1    Number of Visits 12    Date for PT Re-Evaluation 05/23/22    Authorization Type Medicare + [employer]    PT Start Time 1405    PT Stop Time 1443    PT Time Calculation (min) 38 min    Activity Tolerance Patient tolerated treatment well    Behavior During Therapy WFL for tasks assessed/performed             Past Medical History:  Diagnosis Date   Alcoholism ([place])    Resolved   Arthritis    Foot pain, right    History of bronchitis    History of chicken pox    Hypertension    Melanoma in situ ([place]) 2021   left shoulder (Dr. Charm Barges. Allyn Kenner).   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   Procedure: LEFT TOTAL HIP ARTHROPLASTY ANTERIOR APPROACH;  Surgeon: Gaynelle Arabian, MD;  Location: [facility];  Service: Orthopedics;  Laterality: Left;   WISDOM TOOTH EXTRACTION     Patient Active Problem List   Diagnosis Date Noted   Trigger middle finger of right hand 04/04/2022   Partial thickness rotator cuff tear 03/23/2022   Acute low back pain without sciatica 03/23/2022   Anemia 03/23/2022   Alcoholism ([place]) 08/04/2021   Arthritis 08/04/2021   History of bronchitis 08/04/2021   History of chicken pox 08/04/2021   Nocturia 08/04/2021   Urinary frequency 08/04/2021   Palpitations 07/06/2021   Melanoma in situ ([place]) 2021   Left arm pain 04/29/2017   Erectile dysfunction 12/21/2015   HTN (hypertension) 12/08/2014   BPH with obstruction/lower urinary tract symptoms 09/22/2014   Routine general medical examination at a health care facility 09/20/2013   Cervical disc disease 03/24/2013    Annual physical exam 09/16/2012   Pure hypercholesterolemia 11/11/2010   Psoriasis 11/11/2010   Chronic pain in right foot 11/11/2010    PCP: [name], NP  REFERRING PROVIDER: Rosemarie Ax, MD  REFERRING DIAG: [phone] (ICD-10-CM) - Nontraumatic incomplete tear of left rotator cuff  THERAPY DIAG:  Chronic left shoulder pain  Muscle weakness (generalized)  Cramp and spasm  Rationale for Evaluation and Treatment Rehabilitation  ONSET DATE:  couple months   SUBJECTIVE:  SUBJECTIVE STATEMENT: Patient reports L shoulder pain has been going on for a while, it hurts to sleep on L shoulder, also pops and grinds when works out.  Can still swing golf club.  He reports he partially tore his bicep a couple of years ago.   PERTINENT HISTORY: PMH: L THA, LBP, L knee pain, palpitations  PAIN:  Are you having pain? Yes: NPRS scale: 5/10 Pain location: L shoulder Pain description: popping, grinding Aggravating factors: overhead movements Relieving factors: medication  PRECAUTIONS: None  WEIGHT BEARING RESTRICTIONS No  FALLS:  Has patient fallen in last 6 months? No  LIVING ENVIRONMENT: Lives with: lives with their spouse Lives in: House/apartment Stairs: Yes: Internal: 12 steps; on left going up Has following equipment at home: None  OCCUPATION: Sell pool tables/hot tubs, etc  PLOF: Independent with basic ADLs.  Does yoga, plays golf  PATIENT GOALS move arm better, not hurt when sleep  OBJECTIVE:   DIAGNOSTIC FINDINGS:  [place] Limited Joint Space L shoulder Absent biceps tendon within the bicipital groove. Overlying effusion of the subscapularis. The anterior leaflet of the supraspinatus has a full-thickness tear. There is a subacromial bursitis   Summary: Partial complete tear of  the supraspinatus  PATIENT SURVEYS:  Quick Dash 18.2% impairment  COGNITION:  Overall cognitive status: Within functional limits for tasks assessed     SENSATION: WFL  POSTURE: Slightly forward head, noted atrophy over L supraspinatus  UPPER EXTREMITY ROM:   Active ROM Right eval Left eval  Shoulder flexion 165 155  Shoulder extension 65 76  Shoulder abduction 150 160  Shoulder adduction    Shoulder internal rotation Functional T11 Functional  T11  Shoulder external rotation Functional T4 Functional T4  (Blank rows = not tested)  UPPER EXTREMITY MMT:  MMT Right eval Left eval  Shoulder flexion 5 5  Shoulder extension    Shoulder abduction 5 5  Shoulder adduction    Shoulder internal rotation 5 5  Shoulder external rotation 5 5  Middle trapezius    Lower trapezius    Elbow flexion 5 5  Elbow extension 5 5  Wrist flexion    Wrist extension    Grip strength (lbs)    (Blank rows = not tested)  SHOULDER SPECIAL TESTS:  Impingement tests: Painful arc test: positive  80 deg  Rotator cuff assessment: Full can test: positive    JOINT MOBILITY TESTING:  NA  PALPATION:  Noted atrophy over L supraspinatus, tenderness anterior shoulder RTC attachment/LH biceps tendon   TODAY'S TREATMENT:  Therapeutic Exercise: to improve strength and mobility.  Demo, verbal and tactile cues throughout for technique. - Prone Single Arm Shoulder Horizontal Abduction with Scapular Retraction and Palm Down  -  10 reps LUE - Prone W Scapular Retraction - 10 repsLUE - Prone Single Arm Shoulder Y10 reps LUE - Sidelying Shoulder ER with Towel and Dumbbell   - 10 reps no resistance today LUE - Shoulder External Rotation with Anchored Resistance with Towel Under Elbow  -10 reps LUE RTB - Standing Shoulder Internal Rotation with Anchored Resistance 10 reps LUE RTB -shoulder rows RTB x 10 bil -shoulder extension RTB x 10 bil    PATIENT EDUCATION: Education details: educated on findings,  POC, anatomy, and initial HEp Person educated: Patient Education method: Consulting civil engineer, Media planner, Verbal cues, and Handouts Education comprehension: verbalized understanding and returned demonstration   HOME EXERCISE PROGRAM: Access Code: [email or web address]  ASSESSMENT:  CLINICAL IMPRESSION: Patient is a 66 y.o. Right hand dominant male who was  seen today for physical therapy evaluation and treatment for acute on chronic L shoulder pain and L RTC tear.  He reports pain primarily with overhead movements and sleeping on Left side.  He demonstrates good strength and ROM, but noted atrophy L supraspinatus and tenderness anterior shoulder at RTC attachment.  Today given initial HEP for RTC and shoulder stabilizer strengthening, educated to perform in pain free ROM.  He would benefit from skilled physical therapy to decrease L shoulder pain and improve activity tolerance.      OBJECTIVE IMPAIRMENTS decreased activity tolerance, decreased endurance, decreased strength, increased fascial restrictions, impaired perceived functional ability, increased muscle spasms, impaired UE functional use, and pain.   ACTIVITY LIMITATIONS carrying, lifting, bending, and reach over head  PARTICIPATION LIMITATIONS: community activity and yard work  PERSONAL FACTORS Time since onset of injury/illness/exacerbation and 1-2 comorbidities: L RTC tear, psoriasis  are also affecting patient's functional outcome.   REHAB POTENTIAL: Excellent  CLINICAL DECISION MAKING: Stable/uncomplicated  EVALUATION COMPLEXITY: Low   GOALS: Goals reviewed with patient? No  SHORT TERM GOALS: Target date: 04/25/2022   Patient will be independent with initial HEP.  Baseline: given Goal status: INITIAL   LONG TERM GOALS: Target date: 05/23/2022 )  Patient will be independent with advanced/ongoing HEP to improve outcomes and carryover.  Baseline: needs progression Goal status: INITIAL  2.  Patient will report 75% improvement in L  shoulder pain to improve QOL.  Baseline: pain with overhead movements Goal status: INITIAL  3.  Patient will report <10% impairment on QuickDash  to demonstrate improved functional ability.  Baseline: 18.2% disability Goal status: INITIAL  4.  Patient will be able to perform overhead activities without increased L shoulder pain.    Baseline: increased pain and crepitus Goal status: INITIAL    PLAN: PT FREQUENCY: 1-2x/week  PT DURATION: 6 weeks  PLANNED INTERVENTIONS: Therapeutic exercises, Therapeutic activity, Neuromuscular re-education, Balance training, Gait training, Patient/Family education, Self Care, Joint mobilization, Dry Needling, Electrical stimulation, Spinal mobilization, Cryotherapy, Moist heat, Taping, Ultrasound, Ionotophoresis '4mg'$ /ml Dexamethasone, Manual therapy, and Re-evaluation  PLAN FOR NEXT SESSION: review and progress HEP for scapular strengthening.  Add TB rows, extension to HEP.  Manual therapy, modalities PRN   Rennie Natter, PT, DPT  04/11/2022, 6:35 PM

## 2022-04-18 ENCOUNTER — Ambulatory Visit: Payer: Medicare Other | Admitting: Physical Therapy

## 2022-04-18 ENCOUNTER — Encounter: Payer: Self-pay | Admitting: Physical Therapy

## 2022-04-18 DIAGNOSIS — R252 Cramp and spasm: Secondary | ICD-10-CM

## 2022-04-18 DIAGNOSIS — M25512 Pain in left shoulder: Secondary | ICD-10-CM | POA: Diagnosis not present

## 2022-04-18 DIAGNOSIS — M6281 Muscle weakness (generalized): Secondary | ICD-10-CM

## 2022-04-18 DIAGNOSIS — G8929 Other chronic pain: Secondary | ICD-10-CM

## 2022-04-18 NOTE — Therapy (Signed)
OUTPATIENT PHYSICAL THERAPY Treatment Note   Patient Name: Mario Carpenter MRN: [id number] DOB:10/29/1955, 66 y.o., male Today's Date: 04/18/2022   PT End of Session - 04/18/22 1532     Visit Number 2    Number of Visits 12    Date for PT Re-Evaluation 05/23/22    Authorization Type Medicare + [employer]    PT Start Time 1532    PT Stop Time 1613    PT Time Calculation (min) 41 min    Activity Tolerance Patient tolerated treatment well    Behavior During Therapy WFL for tasks assessed/performed             Past Medical History:  Diagnosis Date   Alcoholism ([place])    Resolved   Arthritis    Foot pain, right    History of bronchitis    History of chicken pox    Hypertension    Melanoma in situ ([place]) 2021   left shoulder (Dr. Charm Barges. Allyn Kenner).   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   Procedure: LEFT TOTAL HIP ARTHROPLASTY ANTERIOR APPROACH;  Surgeon: Gaynelle Arabian, MD;  Location: [facility];  Service: Orthopedics;  Laterality: Left;   WISDOM TOOTH EXTRACTION     Patient Active Problem List   Diagnosis Date Noted   Trigger middle finger of right hand 04/04/2022   Partial thickness rotator cuff tear 03/23/2022   Acute low back pain without sciatica 03/23/2022   Anemia 03/23/2022   Alcoholism ([place]) 08/04/2021   Arthritis 08/04/2021   History of bronchitis 08/04/2021   History of chicken pox 08/04/2021   Nocturia 08/04/2021   Urinary frequency 08/04/2021   Palpitations 07/06/2021   Melanoma in situ ([place]) 2021   Left arm pain 04/29/2017   Erectile dysfunction 12/21/2015   HTN (hypertension) 12/08/2014   BPH with obstruction/lower urinary tract symptoms 09/22/2014   Routine general medical examination at a health care facility 09/20/2013   Cervical disc disease 03/24/2013    Annual physical exam 09/16/2012   Pure hypercholesterolemia 11/11/2010   Psoriasis 11/11/2010   Chronic pain in right foot 11/11/2010    PCP: [name], NP  REFERRING PROVIDER: Rosemarie Ax, MD  REFERRING DIAG: [id number] (ICD-10-CM) - Nontraumatic incomplete tear of left rotator cuff  THERAPY DIAG:  Chronic left shoulder pain  Muscle weakness (generalized)  Cramp and spasm  Rationale for Evaluation and Treatment Rehabilitation  ONSET DATE:  couple months   SUBJECTIVE:  SUBJECTIVE STATEMENT: Patient reports L shoulder is about the same, no pain, has been using the nitro patient.  Partial compliance with HEP, broke his band.    PERTINENT HISTORY: PMH: L THA, LBP, L knee pain, palpitations  PAIN:  Are you having pain? Yes: NPRS scale: 0/10 Pain location: L shoulder Pain description: popping, grinding Aggravating factors: overhead movements Relieving factors: medication  PRECAUTIONS: None  WEIGHT BEARING RESTRICTIONS No  FALLS:  Has patient fallen in last 6 months? No  LIVING ENVIRONMENT: Lives with: lives with their spouse Lives in: House/apartment Stairs: Yes: Internal: 12 steps; on left going up Has following equipment at home: None  OCCUPATION: Sell pool tables/hot tubs, etc  PLOF: Independent with basic ADLs.  Does yoga, plays golf  PATIENT GOALS move arm better, not hurt when sleep  OBJECTIVE:   DIAGNOSTIC FINDINGS:  [place] Limited Joint Space L shoulder Absent biceps tendon within the bicipital groove. Overlying effusion of the subscapularis. The anterior leaflet of the supraspinatus has a full-thickness tear. There is a subacromial bursitis   Summary: Partial complete tear of the supraspinatus  PATIENT SURVEYS:  Quick Dash 18.2%  impairment  COGNITION:  Overall cognitive status: Within functional limits for tasks assessed     SENSATION: WFL  POSTURE: Slightly forward head, noted atrophy over L supraspinatus  UPPER EXTREMITY ROM:   Active ROM Right eval Left eval  Shoulder flexion 165 155  Shoulder extension 65 76  Shoulder abduction 150 160  Shoulder adduction    Shoulder internal rotation Functional T11 Functional  T11  Shoulder external rotation Functional T4 Functional T4  (Blank rows = not tested)  UPPER EXTREMITY MMT:  MMT Right eval Left eval  Shoulder flexion 5 5  Shoulder extension    Shoulder abduction 5 5  Shoulder adduction    Shoulder internal rotation 5 5  Shoulder external rotation 5 5  Middle trapezius    Lower trapezius    Elbow flexion 5 5  Elbow extension 5 5  Wrist flexion    Wrist extension    Grip strength (lbs)    (Blank rows = not tested)  SHOULDER SPECIAL TESTS:  Impingement tests: Painful arc test: positive  80 deg  Rotator cuff assessment: Full can test: positive    JOINT MOBILITY TESTING:  NA  PALPATION:  Noted atrophy over L supraspinatus, tenderness anterior shoulder RTC attachment/LH biceps tendon   TODAY'S TREATMENT:  04/18/2022 Therapeutic Exercise: to improve strength and mobility.  Demo, verbal and tactile cues throughout for technique. UBE x 6 min ([id number]) Rows x 15 RTB Shoulder extension x15 RTB L shoulder ER 2 x 10  L shoulder IR x 15 RTB Prone w- arms x 15 Prone Y arm LUE x 15 S/L LUE ER 2# 2 x 15 Ball on wall flexion 3 x 10 CW & CCW Ball on wall abduction 30 sec CW, 30 sec CCW Wall angels x 10 - very challenging.   Demo only - counter push-ups, cobra press-ups, pectoralis stretch.   04/11/2022 Therapeutic Exercise: to improve strength and mobility.  Demo, verbal and tactile cues throughout for technique. - Prone Single Arm Shoulder Horizontal Abduction with Scapular Retraction and Palm Down  -  10 reps LUE - Prone W Scapular Retraction  - 10 repsLUE - Prone Single Arm Shoulder Y10 reps LUE - Sidelying Shoulder ER with Towel and Dumbbell   - 10 reps no resistance today LUE - Shoulder External Rotation with Anchored Resistance with Towel Under Elbow  -10 reps LUE  RTB - Standing Shoulder Internal Rotation with Anchored Resistance 10 reps LUE RTB -shoulder rows RTB x 10 bil -shoulder extension RTB x 10 bil    PATIENT EDUCATION: Education details: HEP review and update.  Person educated: Patient Education method: Explanation, Demonstration, Verbal cues, and Handouts Education comprehension: verbalized understanding and returned demonstration   HOME EXERCISE PROGRAM: Access Code: [email or web address]  ASSESSMENT:  CLINICAL IMPRESSION: WILBORN MEMBRENO reports no pain today, but only partial completion of HEP.  Focused today on review and progressing exercises for scapular and shoulder strengthening.  Only shoulder ER  and wall angel exercises felt "challenging" today, but still no pain.  Progressed HEP and issued new RTB for exercises.  Edrick Kins continues to demonstrate potential for improvement and would benefit from continued skilled therapy to address impairments.    OBJECTIVE IMPAIRMENTS decreased activity tolerance, decreased endurance, decreased strength, increased fascial restrictions, impaired perceived functional ability, increased muscle spasms, impaired UE functional use, and pain.   ACTIVITY LIMITATIONS carrying, lifting, bending, and reach over head  PARTICIPATION LIMITATIONS: community activity and yard work  PERSONAL FACTORS Time since onset of injury/illness/exacerbation and 1-2 comorbidities: L RTC tear, psoriasis  are also affecting patient's functional outcome.   REHAB POTENTIAL: Excellent  CLINICAL DECISION MAKING: Stable/uncomplicated  EVALUATION COMPLEXITY: Low   GOALS: Goals reviewed with patient? No  SHORT TERM GOALS: Target date: 04/25/2022   Patient will be independent with  initial HEP.  Baseline: given Goal status: IN PROGRESS   LONG TERM GOALS: Target date: 05/23/2022 )  Patient will be independent with advanced/ongoing HEP to improve outcomes and carryover.  Baseline: needs progression Goal status: IN PROGRESS  2.  Patient will report 75% improvement in L shoulder pain to improve QOL.  Baseline: pain with overhead movements Goal status: IN PROGRESS  3.  Patient will report <10% impairment on QuickDash  to demonstrate improved functional ability.  Baseline: 18.2% disability Goal status: IN PROGRESS  4.  Patient will be able to perform overhead activities without increased L shoulder pain.    Baseline: increased pain and crepitus Goal status: IN PROGRESS    PLAN: PT FREQUENCY: 1-2x/week  PT DURATION: 6 weeks  PLANNED INTERVENTIONS: Therapeutic exercises, Therapeutic activity, Neuromuscular re-education, Balance training, Gait training, Patient/Family education, Self Care, Joint mobilization, Dry Needling, Electrical stimulation, Spinal mobilization, Cryotherapy, Moist heat, Taping, Ultrasound, Ionotophoresis '4mg'$ /ml Dexamethasone, Manual therapy, and Re-evaluation  PLAN FOR NEXT SESSION: continue to progress shoulder/scapular strengthening as tolerated.  Manual therapy, modalities PRN   Rennie Natter, PT, DPT  04/18/2022, 4:22 PM

## 2022-04-25 ENCOUNTER — Ambulatory Visit (INDEPENDENT_AMBULATORY_CARE_PROVIDER_SITE_OTHER): Payer: Medicare Other | Admitting: Family Medicine

## 2022-04-25 ENCOUNTER — Ambulatory Visit: Payer: Medicare Other

## 2022-04-25 ENCOUNTER — Ambulatory Visit: Payer: Self-pay

## 2022-04-25 VITALS — Ht 74.0 in | Wt 220.0 lb

## 2022-04-25 DIAGNOSIS — M65331 Trigger finger, right middle finger: Secondary | ICD-10-CM

## 2022-04-25 DIAGNOSIS — M25512 Pain in left shoulder: Secondary | ICD-10-CM | POA: Diagnosis not present

## 2022-04-25 DIAGNOSIS — M6281 Muscle weakness (generalized): Secondary | ICD-10-CM

## 2022-04-25 DIAGNOSIS — R252 Cramp and spasm: Secondary | ICD-10-CM

## 2022-04-25 DIAGNOSIS — M75112 Incomplete rotator cuff tear or rupture of left shoulder, not specified as traumatic: Secondary | ICD-10-CM | POA: Diagnosis not present

## 2022-04-25 DIAGNOSIS — G8929 Other chronic pain: Secondary | ICD-10-CM

## 2022-04-25 MED ORDER — TRIAMCINOLONE ACETONIDE 40 MG/ML IJ SUSP
40.0000 mg | Freq: Once | INTRAMUSCULAR | Status: AC
  Administered 2022-04-25: 40 mg via INTRA_ARTICULAR

## 2022-04-25 NOTE — Assessment & Plan Note (Signed)
Doing well with physical therapy.  Denies any pain today. -Counseled on home exercise therapy and supportive care. -Continue physical therapy.

## 2022-04-25 NOTE — Patient Instructions (Signed)
Good to see you You can continue using the splint for a couple more weeks.   Please send me a message in MyChart with any questions or updates.  Please see me back in 4 weeks or as needed if better.   --Dr. Raeford Razor

## 2022-04-25 NOTE — Therapy (Signed)
OUTPATIENT PHYSICAL THERAPY Treatment Note   Patient Name: Mario Carpenter MRN: [id number] DOB:February 17, 1956, 66 y.o., male Today's Date: 04/25/2022   PT End of Session - 04/25/22 0913     Visit Number 3    Number of Visits 12    Date for PT Re-Evaluation 05/23/22    Authorization Type Medicare + [employer]    PT Start Time 0908    PT Stop Time 0950    PT Time Calculation (min) 42 min    Activity Tolerance Patient tolerated treatment well    Behavior During Therapy [facility] for tasks assessed/performed              Past Medical History:  Diagnosis Date   Alcoholism ([place])    Resolved   Arthritis    Foot pain, right    History of bronchitis    History of chicken pox    Hypertension    Melanoma in situ ([place]) 2021   left shoulder (Dr. Charm Barges. Allyn Kenner).   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   Procedure: LEFT TOTAL HIP ARTHROPLASTY ANTERIOR APPROACH;  Surgeon: Gaynelle Arabian, MD;  Location: [facility];  Service: Orthopedics;  Laterality: Left;   WISDOM TOOTH EXTRACTION     Patient Active Problem List   Diagnosis Date Noted   Trigger middle finger of right hand 04/04/2022   Partial thickness rotator cuff tear 03/23/2022   Acute low back pain without sciatica 03/23/2022   Anemia 03/23/2022   Alcoholism ([place]) 08/04/2021   Arthritis 08/04/2021   History of bronchitis 08/04/2021   History of chicken pox 08/04/2021   Nocturia 08/04/2021   Urinary frequency 08/04/2021   Palpitations 07/06/2021   Melanoma in situ ([place]) 2021   Left arm pain 04/29/2017   Erectile dysfunction 12/21/2015   HTN (hypertension) 12/08/2014   BPH with obstruction/lower urinary tract symptoms 09/22/2014   Routine general medical examination at a health care facility 09/20/2013   Cervical disc disease 03/24/2013    Annual physical exam 09/16/2012   Pure hypercholesterolemia 11/11/2010   Psoriasis 11/11/2010   Chronic pain in right foot 11/11/2010    PCP: [name], NP  REFERRING PROVIDER: Rosemarie Ax, MD  REFERRING DIAG: [phone] (ICD-10-CM) - Nontraumatic incomplete tear of left rotator cuff  THERAPY DIAG:  Chronic left shoulder pain  Muscle weakness (generalized)  Cramp and spasm  Rationale for Evaluation and Treatment Rehabilitation  ONSET DATE:  couple months   SUBJECTIVE:  SUBJECTIVE STATEMENT: "Just came from Dr. Raeford Razor, got an injection for trigger finger and was told to continue PT and see him back if needed."   PERTINENT HISTORY: PMH: L THA, LBP, L knee pain, palpitations  PAIN:  Are you having pain? Yes: NPRS scale: 0/10 Pain location: L shoulder only hurts when going into abduction Pain description: popping, grinding Aggravating factors: overhead movements Relieving factors: medication  PRECAUTIONS: None  WEIGHT BEARING RESTRICTIONS No  FALLS:  Has patient fallen in last 6 months? No  LIVING ENVIRONMENT: Lives with: lives with their spouse Lives in: House/apartment Stairs: Yes: Internal: 12 steps; on left going up Has following equipment at home: None  OCCUPATION: Sell pool tables/hot tubs, etc  PLOF: Independent with basic ADLs.  Does yoga, plays golf  PATIENT GOALS move arm better, not hurt when sleep  OBJECTIVE:   DIAGNOSTIC FINDINGS:  [place] Limited Joint Space L shoulder Absent biceps tendon within the bicipital groove. Overlying effusion of the subscapularis. The anterior leaflet of the supraspinatus has a full-thickness tear. There is a subacromial bursitis   Summary: Partial complete tear of the supraspinatus  PATIENT SURVEYS:  Quick Dash 18.2%  impairment  COGNITION:  Overall cognitive status: Within functional limits for tasks assessed     SENSATION: WFL  POSTURE: Slightly forward head, noted atrophy over L supraspinatus  UPPER EXTREMITY ROM:   Active ROM Right eval Left eval  Shoulder flexion 165 155  Shoulder extension 65 76  Shoulder abduction 150 160  Shoulder adduction    Shoulder internal rotation Functional T11 Functional  T11  Shoulder external rotation Functional T4 Functional T4  (Blank rows = not tested)  UPPER EXTREMITY MMT:  MMT Right eval Left eval  Shoulder flexion 5 5  Shoulder extension    Shoulder abduction 5 5  Shoulder adduction    Shoulder internal rotation 5 5  Shoulder external rotation 5 5  Middle trapezius    Lower trapezius    Elbow flexion 5 5  Elbow extension 5 5  Wrist flexion    Wrist extension    Grip strength (lbs)    (Blank rows = not tested)  SHOULDER SPECIAL TESTS:  Impingement tests: Painful arc test: positive  80 deg  Rotator cuff assessment: Full can test: positive    JOINT MOBILITY TESTING:  NA  PALPATION:  Noted atrophy over L supraspinatus, tenderness anterior shoulder RTC attachment/LH biceps tendon   TODAY'S TREATMENT:  04/25/22 Therapeutic Exercise: UBE x 6 min (73f3b) Standing: Isometric IR w/ RTB x 10 step outs IR w/ RTB x 10 con/ecc ER w/ RTB x 10, iso step out ER w/ RTB x 10 con/ecc Wall angels x 10 Wall pushups x 10 Bent over scaption w/ 1lb DB 2 x 10  Seated: BATCA row 20# 2x10; 3 sec pause on concentric  Manual Therapy: STM to L proximal bicep and pecs PROM to L shoulder all directions  04/18/2022 Therapeutic Exercise: to improve strength and mobility.  Demo, verbal and tactile cues throughout for technique. UBE x 6 min (381fb) Rows x 15 RTB Shoulder extension x15 RTB L shoulder ER 2 x 10  L shoulder IR x 15 RTB Prone w- arms x 15 Prone Y arm LUE x 15 S/L LUE ER 2# 2 x 15 Ball on wall flexion 3 x 10 CW & CCW Ball on wall  abduction 30 sec CW, 30 sec CCW Wall angels x 10 - very challenging.   Demo only - counter push-ups, cobra press-ups, pectoralis stretch.  04/11/2022 Therapeutic Exercise: to improve strength and mobility.  Demo, verbal and tactile cues throughout for technique. - Prone Single Arm Shoulder Horizontal Abduction with Scapular Retraction and Palm Down  -  10 reps LUE - Prone W Scapular Retraction - 10 repsLUE - Prone Single Arm Shoulder Y10 reps LUE - Sidelying Shoulder ER with Towel and Dumbbell   - 10 reps no resistance today LUE - Shoulder External Rotation with Anchored Resistance with Towel Under Elbow  -10 reps LUE RTB - Standing Shoulder Internal Rotation with Anchored Resistance 10 reps LUE RTB -shoulder rows RTB x 10 bil -shoulder extension RTB x 10 bil    PATIENT EDUCATION: Education details: HEP review and update.  Person educated: Patient Education method: Explanation, Demonstration, Verbal cues, and Handouts Education comprehension: verbalized understanding and returned demonstration   HOME EXERCISE PROGRAM: Access Code: [email or web address]  ASSESSMENT:  CLINICAL IMPRESSION: Pt is now independent with initial HEP. Advanced through RTC and postural strengthening w/o issues. Mild pain noted w/ wall angels. Cues required with seated row to emphasize scap retraction. TTP found along proximal biceps and pec along coracoid process attachment, good PROM during MT w/ no increased pain.  OBJECTIVE IMPAIRMENTS decreased activity tolerance, decreased endurance, decreased strength, increased fascial restrictions, impaired perceived functional ability, increased muscle spasms, impaired UE functional use, and pain.   ACTIVITY LIMITATIONS carrying, lifting, bending, and reach over head  PARTICIPATION LIMITATIONS: community activity and yard work  PERSONAL FACTORS Time since onset of injury/illness/exacerbation and 1-2 comorbidities: L RTC tear, psoriasis  are also affecting patient's functional  outcome.   REHAB POTENTIAL: Excellent  CLINICAL DECISION MAKING: Stable/uncomplicated  EVALUATION COMPLEXITY: Low   GOALS: Goals reviewed with patient? No  SHORT TERM GOALS: Target date: 04/25/2022   Patient will be independent with initial HEP.  Baseline: given Goal status: MET (04/25/22)   LONG TERM GOALS: Target date: 05/23/2022 )  Patient will be independent with advanced/ongoing HEP to improve outcomes and carryover.  Baseline: needs progression Goal status: IN PROGRESS  2.  Patient will report 75% improvement in L shoulder pain to improve QOL.  Baseline: pain with overhead movements Goal status: IN PROGRESS  3.  Patient will report <10% impairment on QuickDash  to demonstrate improved functional ability.  Baseline: 18.2% disability Goal status: IN PROGRESS  4.  Patient will be able to perform overhead activities without increased L shoulder pain.    Baseline: increased pain and crepitus Goal status: IN PROGRESS    PLAN: PT FREQUENCY: 1-2x/week  PT DURATION: 6 weeks  PLANNED INTERVENTIONS: Therapeutic exercises, Therapeutic activity, Neuromuscular re-education, Balance training, Gait training, Patient/Family education, Self Care, Joint mobilization, Dry Needling, Electrical stimulation, Spinal mobilization, Cryotherapy, Moist heat, Taping, Ultrasound, Ionotophoresis 22m/ml Dexamethasone, Manual therapy, and Re-evaluation  PLAN FOR NEXT SESSION: continue to progress shoulder/scapular strengthening as tolerated.  Manual therapy, modalities PRN   BArtist Pais PTA 04/25/2022, 9:59 AM

## 2022-04-25 NOTE — Progress Notes (Signed)
  Mario Carpenter - 66 y.o. male MRN [id number]  Date of birth: [birth year]  SUBJECTIVE:  Including CC & ROS.  No chief complaint on file.   Mario Carpenter is a 66 y.o. male that is following up for his trigger finger of his right hand.  Having altered sensation of the left hip..    Review of Systems See HPI   HISTORY: Past Medical, Surgical, Social, and Family History Reviewed & Updated per EMR.   Pertinent Historical Findings include:  Past Medical History:  Diagnosis Date   Alcoholism ([place])    Resolved   Arthritis    Foot pain, right    History of bronchitis    History of chicken pox    Hypertension    Melanoma in situ ([place]) 2021   left shoulder (Dr. Charm Barges. Allyn Kenner).   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   Procedure: LEFT TOTAL HIP ARTHROPLASTY ANTERIOR APPROACH;  Surgeon: Gaynelle Arabian, MD;  Location: [facility];  Service: Orthopedics;  Laterality: Left;   WISDOM TOOTH EXTRACTION       PHYSICAL EXAM:  VS: Ht '6\' 2"'$  (1.88 m)   Wt 220 lb (99.8 kg)   BMI 28.25 kg/m  Physical Exam Gen: NAD, alert, cooperative with exam, well-appearing MSK:  Neurovascularly intact    Limited ultrasound: Right hand:  Nodule appreciated on the flexor aspect of the third A1 pulley  Summary: Findings consistent with trigger finger  Ultrasound and interpretation by Clearance Coots, MD  Aspiration/Injection Procedure Note Mario Carpenter 10/08/1955  Procedure: Injection Indications: Right middle finger trigger finger  Procedure Details Consent: Risks of procedure as well as the alternatives and risks of each were explained to the (patient/caregiver).  Consent for procedure obtained. Time Out: Verified patient identification, verified procedure, site/side was marked, verified correct  patient position, special equipment/implants available, medications/allergies/relevent history reviewed, required imaging and test results available.  Performed.  The area was cleaned with iodine and alcohol swabs.    The right middle finger trigger finger was injected using 1 cc's of 40 mg Kenalog and 1 cc's of 0.25% bupivacaine with a 25 1 1/2" needle.  Ultrasound was used. Images were obtained in long views showing the injection.     A sterile dressing was applied.  Patient did tolerate procedure well.     ASSESSMENT & PLAN:   Partial thickness rotator cuff tear Doing well with physical therapy.  Denies any pain today. -Counseled on home exercise therapy and supportive care. -Continue physical therapy.  Trigger middle finger of right hand Acutely occurring.  Has tried splinting and triggering is still evident in the mornings. -Counseled on home exercise therapy and supportive care. -injection today. - counseled On splinting.

## 2022-04-25 NOTE — Assessment & Plan Note (Signed)
Acutely occurring.  Has tried splinting and triggering is still evident in the mornings. -Counseled on home exercise therapy and supportive care. -injection today. - counseled On splinting.

## 2022-05-02 ENCOUNTER — Encounter: Payer: Medicare Other | Admitting: Physical Therapy

## 2022-05-10 ENCOUNTER — Ambulatory Visit: Payer: Medicare Other | Attending: Family Medicine

## 2022-05-10 DIAGNOSIS — M25512 Pain in left shoulder: Secondary | ICD-10-CM | POA: Insufficient documentation

## 2022-05-10 DIAGNOSIS — M6281 Muscle weakness (generalized): Secondary | ICD-10-CM | POA: Diagnosis present

## 2022-05-10 DIAGNOSIS — G8929 Other chronic pain: Secondary | ICD-10-CM | POA: Diagnosis present

## 2022-05-10 DIAGNOSIS — R252 Cramp and spasm: Secondary | ICD-10-CM | POA: Diagnosis present

## 2022-05-10 NOTE — Therapy (Signed)
OUTPATIENT PHYSICAL THERAPY Treatment Note   Patient Name: Mario Carpenter MRN: [id number] DOB:06/02/1956, 66 y.o., male Today's Date: 05/10/2022   PT End of Session - 05/10/22 1632     Visit Number 4    Number of Visits 12    Date for PT Re-Evaluation 05/23/22    Authorization Type Medicare + [employer]    PT Start Time 1615    PT Stop Time 1659    PT Time Calculation (min) 44 min    Activity Tolerance Patient tolerated treatment well    Behavior During Therapy WFL for tasks assessed/performed               Past Medical History:  Diagnosis Date   Alcoholism ([place])    Resolved   Arthritis    Foot pain, right    History of bronchitis    History of chicken pox    Hypertension    Melanoma in situ ([place]) 2021   left shoulder (Dr. Charm Barges. Allyn Kenner).   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   Procedure: LEFT TOTAL HIP ARTHROPLASTY ANTERIOR APPROACH;  Surgeon: Gaynelle Arabian, MD;  Location: [facility];  Service: Orthopedics;  Laterality: Left;   WISDOM TOOTH EXTRACTION     Patient Active Problem List   Diagnosis Date Noted   Trigger middle finger of right hand 04/04/2022   Partial thickness rotator cuff tear 03/23/2022   Acute low back pain without sciatica 03/23/2022   Anemia 03/23/2022   Alcoholism ([place]) 08/04/2021   Arthritis 08/04/2021   History of bronchitis 08/04/2021   History of chicken pox 08/04/2021   Nocturia 08/04/2021   Urinary frequency 08/04/2021   Palpitations 07/06/2021   Melanoma in situ ([place]) 2021   Left arm pain 04/29/2017   Erectile dysfunction 12/21/2015   HTN (hypertension) 12/08/2014   BPH with obstruction/lower urinary tract symptoms 09/22/2014   Routine general medical examination at a health care facility 09/20/2013   Cervical disc disease 03/24/2013    Annual physical exam 09/16/2012   Pure hypercholesterolemia 11/11/2010   Psoriasis 11/11/2010   Chronic pain in right foot 11/11/2010    PCP: [name], NP  REFERRING PROVIDER: Rosemarie Ax, MD  REFERRING DIAG: [phone] (ICD-10-CM) - Nontraumatic incomplete tear of left rotator cuff  THERAPY DIAG:  Chronic left shoulder pain  Muscle weakness (generalized)  Cramp and spasm  Rationale for Evaluation and Treatment Rehabilitation  ONSET DATE:  couple months   SUBJECTIVE:  SUBJECTIVE STATEMENT: Pt reports that his L shoulder has been doing fine, exercises at home are going fine but still having difficulty with reaching up and away from body.  PERTINENT HISTORY: PMH: L THA, LBP, L knee pain, palpitations  PAIN:  Are you having pain? No  PRECAUTIONS: None  WEIGHT BEARING RESTRICTIONS No  FALLS:  Has patient fallen in last 6 months? No  LIVING ENVIRONMENT: Lives with: lives with their spouse Lives in: House/apartment Stairs: Yes: Internal: 12 steps; on left going up Has following equipment at home: None  OCCUPATION: Sell pool tables/hot tubs, etc  PLOF: Independent with basic ADLs.  Does yoga, plays golf  PATIENT GOALS move arm better, not hurt when sleep  OBJECTIVE:   DIAGNOSTIC FINDINGS:  [place] Limited Joint Space L shoulder Absent biceps tendon within the bicipital groove. Overlying effusion of the subscapularis. The anterior leaflet of the supraspinatus has a full-thickness tear. There is a subacromial bursitis   Summary: Partial complete tear of the supraspinatus  PATIENT SURVEYS:  Quick Dash 18.2% impairment  COGNITION:  Overall cognitive status: Within functional limits for tasks assessed     SENSATION: WFL  POSTURE: Slightly forward head, noted atrophy  over L supraspinatus  UPPER EXTREMITY ROM:   Active ROM Right eval Left eval  Shoulder flexion 165 155  Shoulder extension 65 76  Shoulder abduction 150 160  Shoulder adduction    Shoulder internal rotation Functional T11 Functional  T11  Shoulder external rotation Functional T4 Functional T4  (Blank rows = not tested)  UPPER EXTREMITY MMT:  MMT Right eval Left eval  Shoulder flexion 5 5  Shoulder extension    Shoulder abduction 5 5  Shoulder adduction    Shoulder internal rotation 5 5  Shoulder external rotation 5 5  Middle trapezius    Lower trapezius    Elbow flexion 5 5  Elbow extension 5 5  Wrist flexion    Wrist extension    Grip strength (lbs)    (Blank rows = not tested)  SHOULDER SPECIAL TESTS:  Impingement tests: Painful arc test: positive  80 deg  Rotator cuff assessment: Full can test: positive    JOINT MOBILITY TESTING:  NA  PALPATION:  Noted atrophy over L supraspinatus, tenderness anterior shoulder RTC attachment/LH biceps tendon   TODAY'S TREATMENT:  05/10/22 Therapeutic Exercise: UBE 2.0 (79f3b) Standing row green TB x 10 Standing ext green TB x 10 Standing isometric ER with green TB x 10 - 5 sec hold Seated horiz ABD green TB 2x10 Standing ball circles on wall x 30 sec Prone shld flexion 3# weight x 15 Prone shld ext 3# weight x 10  Manual Therapy: STM to L biceps proximal attachments  04/25/22 Therapeutic Exercise: UBE x 6 min (38fb) Standing: Isometric IR w/ RTB x 10 step outs IR w/ RTB x 10 con/ecc ER w/ RTB x 10, iso step out ER w/ RTB x 10 con/ecc Wall angels x 10 Wall pushups x 10 Bent over scaption w/ 1lb DB 2 x 10  Seated: BATCA row 20# 2x10; 3 sec pause on concentric  Manual Therapy: STM to L proximal bicep and pecs PROM to L shoulder all directions  04/18/2022 Therapeutic Exercise: to improve strength and mobility.  Demo, verbal and tactile cues throughout for technique. UBE x 6 min (80f19f) Rows x 15  RTB Shoulder extension x15 RTB L shoulder ER 2 x 10  L shoulder IR x 15 RTB Prone w- arms x 15 Prone Y arm LUE  x 15 S/L LUE ER 2# 2 x 15 Ball on wall flexion 3 x 10 CW & CCW Ball on wall abduction 30 sec CW, 30 sec CCW Wall angels x 10 - very challenging.   Demo only - counter push-ups, cobra press-ups, pectoralis stretch.   04/11/2022 Therapeutic Exercise: to improve strength and mobility.  Demo, verbal and tactile cues throughout for technique. - Prone Single Arm Shoulder Horizontal Abduction with Scapular Retraction and Palm Down  -  10 reps LUE - Prone W Scapular Retraction - 10 repsLUE - Prone Single Arm Shoulder Y10 reps LUE - Sidelying Shoulder ER with Towel and Dumbbell   - 10 reps no resistance today LUE - Shoulder External Rotation with Anchored Resistance with Towel Under Elbow  -10 reps LUE RTB - Standing Shoulder Internal Rotation with Anchored Resistance 10 reps LUE RTB -shoulder rows RTB x 10 bil -shoulder extension RTB x 10 bil    PATIENT EDUCATION: Education details: HEP review and update.  Person educated: Patient Education method: Explanation, Demonstration, Verbal cues, and Handouts Education comprehension: verbalized understanding and returned demonstration   HOME EXERCISE PROGRAM: Access Code: [email or web address]  ASSESSMENT:  CLINICAL IMPRESSION: Pt is progressing well with OH reaching ability, he was able to lift 4lb OH with L UE alone but have yet to assess his ability to work [place]. Continued with progression of scapular strengthening. No pain with any interventions. Still tenderness noted along biceps proximal attachment.  OBJECTIVE IMPAIRMENTS decreased activity tolerance, decreased endurance, decreased strength, increased fascial restrictions, impaired perceived functional ability, increased muscle spasms, impaired UE functional use, and pain.   ACTIVITY LIMITATIONS carrying, lifting, bending, and reach over head  PARTICIPATION LIMITATIONS: community activity  and yard work  PERSONAL FACTORS Time since onset of injury/illness/exacerbation and 1-2 comorbidities: L RTC tear, psoriasis  are also affecting patient's functional outcome.   REHAB POTENTIAL: Excellent  CLINICAL DECISION MAKING: Stable/uncomplicated  EVALUATION COMPLEXITY: Low   GOALS: Goals reviewed with patient? No  SHORT TERM GOALS: Target date: 04/25/2022   Patient will be independent with initial HEP.  Baseline: given Goal status: MET (04/25/22)   LONG TERM GOALS: Target date: 05/23/2022 )  Patient will be independent with advanced/ongoing HEP to improve outcomes and carryover.  Baseline: needs progression Goal status: IN PROGRESS  2.  Patient will report 75% improvement in L shoulder pain to improve QOL.  Baseline: pain with overhead movements Goal status: IN PROGRESS  3.  Patient will report <10% impairment on QuickDash  to demonstrate improved functional ability.  Baseline: 18.2% disability Goal status: IN PROGRESS  4.  Patient will be able to perform overhead activities without increased L shoulder pain.    Baseline: increased pain and crepitus Goal status: IN PROGRESS 05/10/22 - able to place 4lb weight onto shelf OH w/o increased pain   PLAN: PT FREQUENCY: 1-2x/week  PT DURATION: 6 weeks  PLANNED INTERVENTIONS: Therapeutic exercises, Therapeutic activity, Neuromuscular re-education, Balance training, Gait training, Patient/Family education, Self Care, Joint mobilization, Dry Needling, Electrical stimulation, Spinal mobilization, Cryotherapy, Moist heat, Taping, Ultrasound, Ionotophoresis 61m/ml Dexamethasone, Manual therapy, and Re-evaluation  PLAN FOR NEXT SESSION: continue to progress shoulder/scapular strengthening as tolerated.  Manual therapy, modalities PRN   BArtist Pais PTA 05/10/2022, 5:02 PM

## 2022-05-15 ENCOUNTER — Other Ambulatory Visit: Payer: Self-pay | Admitting: Family

## 2022-05-16 ENCOUNTER — Ambulatory Visit: Payer: Medicare Other | Admitting: Physical Therapy

## 2022-05-16 ENCOUNTER — Encounter: Payer: Self-pay | Admitting: Physical Therapy

## 2022-05-16 DIAGNOSIS — G8929 Other chronic pain: Secondary | ICD-10-CM

## 2022-05-16 DIAGNOSIS — R252 Cramp and spasm: Secondary | ICD-10-CM

## 2022-05-16 DIAGNOSIS — M6281 Muscle weakness (generalized): Secondary | ICD-10-CM

## 2022-05-16 DIAGNOSIS — M25512 Pain in left shoulder: Secondary | ICD-10-CM | POA: Diagnosis not present

## 2022-05-16 NOTE — Therapy (Signed)
OUTPATIENT PHYSICAL THERAPY Treatment Note   Patient Name: Mario Carpenter MRN: [id number] DOB:[birth year], 66 y.o., male Today's Date: 05/16/2022   PT End of Session - 05/16/22 1407     Visit Number 5    Number of Visits 12    Date for PT Re-Evaluation 05/23/22    Authorization Type Medicare + [employer]    PT Start Time 1405    PT Stop Time 1447    PT Time Calculation (min) 42 min    Activity Tolerance Patient tolerated treatment well    Behavior During Therapy WFL for tasks assessed/performed                Past Medical History:  Diagnosis Date   Alcoholism ([place])    Resolved   Arthritis    Foot pain, right    History of bronchitis    History of chicken pox    Hypertension    Melanoma in situ ([place]) 2021   left shoulder (Dr. Charm Barges. Al[facility]).   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   Procedure: LEFT TOTAL HIP ARTHROPLASTY ANTERIOR APPROACH;  Surgeon: Gaynelle Arabian, MD;  Location: [facility];  Service: Orthopedics;  Laterality: Left;   WISDOM TOOTH EXTRACTION     Patient Active Problem List   Diagnosis Date Noted   Trigger middle finger of right hand 04/04/2022   Partial thickness rotator cuff tear 03/23/2022   Acute low back pain without sciatica 03/23/2022   Anemia 03/23/2022   Alcoholism ([place]) 08/04/2021   Arthritis 08/04/2021   History of bronchitis 08/04/2021   History of chicken pox 08/04/2021   Nocturia 08/04/2021   Urinary frequency 08/04/2021   Palpitations 07/06/2021   Melanoma in situ ([place]) 2021   Left arm pain 04/29/2017   Erectile dysfunction 12/21/2015   HTN (hypertension) 12/08/2014   BPH with obstruction/lower urinary tract symptoms 09/22/2014   Routine general medical examination at a health care facility 09/20/2013   Cervical disc disease 03/24/2013    Annual physical exam 09/16/2012   Pure hypercholesterolemia 11/11/2010   Psoriasis 11/11/2010   Chronic pain in right foot 11/11/2010    PCP: [name], NP  REFERRING PROVIDER: Rosemarie Ax, MD  REFERRING DIAG: [phone] (ICD-10-CM) - Nontraumatic incomplete tear of left rotator cuff  THERAPY DIAG:  Chronic left shoulder pain  Muscle weakness (generalized)  Cramp and spasm  Rationale for Evaluation and Treatment Rehabilitation  ONSET DATE:  couple months   SUBJECTIVE:  SUBJECTIVE STATEMENT: Mario Carpenter reports shoulder feeling better, exercises are going well "when I do them."  No pain as long as wears the nitro patch.   Has some pain with wall angels but that is the only problematic exercise now.   PERTINENT HISTORY: PMH: L THA, LBP, L knee pain, palpitations  PAIN:  Are you having pain? No  PRECAUTIONS: None  WEIGHT BEARING RESTRICTIONS No  FALLS:  Has patient fallen in last 6 months? No  LIVING ENVIRONMENT: Lives with: lives with their spouse Lives in: House/apartment Stairs: Yes: Internal: 12 steps; on left going up Has following equipment at home: None  OCCUPATION: Sell pool tables/hot tubs, etc  PLOF: Independent with basic ADLs.  Does yoga, plays golf  PATIENT GOALS move arm better, not hurt when sleep  OBJECTIVE:   DIAGNOSTIC FINDINGS:  [place] Limited Joint Space L shoulder Absent biceps tendon within the bicipital groove. Overlying effusion of the subscapularis. The anterior leaflet of the supraspinatus has a full-thickness tear. There is a subacromial bursitis   Summary: Partial complete tear of the supraspinatus  PATIENT SURVEYS:  Quick Dash 18.2% impairment  COGNITION:  Overall cognitive status: Within functional limits for tasks  assessed     SENSATION: WFL  POSTURE: Slightly forward head, noted atrophy over L supraspinatus  UPPER EXTREMITY ROM:   Active ROM Right eval Left eval  Shoulder flexion 165 155  Shoulder extension 65 76  Shoulder abduction 150 160  Shoulder adduction    Shoulder internal rotation Functional T11 Functional  T11  Shoulder external rotation Functional T4 Functional T4  (Blank rows = not tested)  UPPER EXTREMITY MMT:  MMT Right eval Left eval  Shoulder flexion 5 5  Shoulder extension    Shoulder abduction 5 5  Shoulder adduction    Shoulder internal rotation 5 5  Shoulder external rotation 5 5  Middle trapezius    Lower trapezius    Elbow flexion 5 5  Elbow extension 5 5  Wrist flexion    Wrist extension    Grip strength (lbs)    (Blank rows = not tested)  SHOULDER SPECIAL TESTS:  Impingement tests: Painful arc test: positive  80 deg  Rotator cuff assessment: Full can test: positive    JOINT MOBILITY TESTING:  NA  PALPATION:  Noted atrophy over L supraspinatus, tenderness anterior shoulder RTC attachment/LH biceps tendon   TODAY'S TREATMENT:  05/16/2022 Therapeutic Exercise: to improve strength and mobility.  Demo, verbal and tactile cues throughout for technique. Nustep L5 x 5 min  Lat pull downs 20#, 1 x 15 35# 1 x 15, 45# 1 x 15 , 55# 1 x 15 At wall - serratus raises with red theraband and foam roller 2 x 10 Walk ups at wall with red theraband x 10  Wall push-ups 2 x 10  Wall angels x 10 - manual therapy to latissimus before repeating exercise with  no pain.  Latissimus stretch at wall and with foam roller On foam roller lengthwise: Shoulder flexion/extension x 10 bil Y-arms on foam roller x 10 Demo thoracic self mobs  Manual Therapy: to decrease muscle spasm and pain and improve mobility STM/IASTM to R lats to decrease pain.  Palpable trigger point noted.   05/10/22 Therapeutic Exercise: UBE 2.0 (43f3b) Standing row green TB x 10 Standing ext  green TB x 10 Standing isometric ER with green TB x 10 - 5 sec hold Seated horiz ABD green TB 2x10 Standing ball circles on wall x 30 sec  Prone shld flexion 3# weight x 15 Prone shld ext 3# weight x 10  Manual Therapy: STM to L biceps proximal attachments  04/25/22 Therapeutic Exercise: UBE x 6 min (62f3b) Standing: Isometric IR w/ RTB x 10 step outs IR w/ RTB x 10 con/ecc ER w/ RTB x 10, iso step out ER w/ RTB x 10 con/ecc Wall angels x 10 Wall pushups x 10 Bent over scaption w/ 1lb DB 2 x 10  Seated: BATCA row 20# 2x10; 3 sec pause on concentric  Manual Therapy: STM to L proximal bicep and pecs PROM to L shoulder all directions  04/18/2022 Therapeutic Exercise: to improve strength and mobility.  Demo, verbal and tactile cues throughout for technique. UBE x 6 min (370fb) Rows x 15 RTB Shoulder extension x15 RTB L shoulder ER 2 x 10  L shoulder IR x 15 RTB Prone w- arms x 15 Prone Y arm LUE x 15 S/L LUE ER 2# 2 x 15 Ball on wall flexion 3 x 10 CW & CCW Ball on wall abduction 30 sec CW, 30 sec CCW Wall angels x 10 - very challenging.   Demo only - counter push-ups, cobra press-ups, pectoralis stretch.   04/11/2022 Therapeutic Exercise: to improve strength and mobility.  Demo, verbal and tactile cues throughout for technique. - Prone Single Arm Shoulder Horizontal Abduction with Scapular Retraction and Palm Down  -  10 reps LUE - Prone W Scapular Retraction - 10 repsLUE - Prone Single Arm Shoulder Y10 reps LUE - Sidelying Shoulder ER with Towel and Dumbbell   - 10 reps no resistance today LUE - Shoulder External Rotation with Anchored Resistance with Towel Under Elbow  -10 reps LUE RTB - Standing Shoulder Internal Rotation with Anchored Resistance 10 reps LUE RTB -shoulder rows RTB x 10 bil -shoulder extension RTB x 10 bil    PATIENT EDUCATION: Education details: progressed HEP for strengthening and thoracic mobilization.  Person educated: Patient Education method:  ExConsulting civil engineerDemonstration, Verbal cues, and Handouts Education comprehension: verbalized understanding and returned demonstration   HOME EXERCISE PROGRAM: Access Code: [email or web address]:  CLINICAL IMPRESSION: Mario LARICCIAs making good progress and reporting almost no shoulder pain, only has pain with wall angels in R lat, noted trigger point here which improved after manual therapy.  Otherwise able to perform all exercises without pain.  Updated HEP today with exercises for lats, thoracic mobilization and serratus strengthening.  ChEdrick Kinsontinues to demonstrate potential for improvement and would benefit from continued skilled therapy to address impairments.     OBJECTIVE IMPAIRMENTS decreased activity tolerance, decreased endurance, decreased strength, increased fascial restrictions, impaired perceived functional ability, increased muscle spasms, impaired UE functional use, and pain.   ACTIVITY LIMITATIONS carrying, lifting, bending, and reach over head  PARTICIPATION LIMITATIONS: community activity and yard work  PERSONAL FACTORS Time since onset of injury/illness/exacerbation and 1-2 comorbidities: L RTC tear, psoriasis  are also affecting patient's functional outcome.   REHAB POTENTIAL: Excellent  CLINICAL DECISION MAKING: Stable/uncomplicated  EVALUATION COMPLEXITY: Low   GOALS: Goals reviewed with patient? No  SHORT TERM GOALS: Target date: 04/25/2022   Patient will be independent with initial HEP.  Baseline: given Goal status: MET (04/25/22)   LONG TERM GOALS: Target date: 05/23/2022 )  Patient will be independent with advanced/ongoing HEP to improve outcomes and carryover.  Baseline: needs progression Goal status: IN PROGRESS  2.  Patient will report 75% improvement in L shoulder pain to improve QOL.  Baseline: pain with  overhead movements Goal status: IN PROGRESS  3.  Patient will report <10% impairment on QuickDash  to demonstrate  improved functional ability.  Baseline: 18.2% disability Goal status: IN PROGRESS  4.  Patient will be able to perform overhead activities without increased L shoulder pain.    Baseline: increased pain and crepitus Goal status: IN PROGRESS 05/10/22 - able to place 4lb weight onto shelf OH w/o increased pain   PLAN: PT FREQUENCY: 1-2x/week  PT DURATION: 6 weeks  PLANNED INTERVENTIONS: Therapeutic exercises, Therapeutic activity, Neuromuscular re-education, Balance training, Gait training, Patient/Family education, Self Care, Joint mobilization, Dry Needling, Electrical stimulation, Spinal mobilization, Cryotherapy, Moist heat, Taping, Ultrasound, Ionotophoresis 34m/ml Dexamethasone, Manual therapy, and Re-evaluation  PLAN FOR NEXT SESSION: reassess, 30 day hold if appropriate.   ERennie Natter PT, DPT 05/16/2022, 3:11 PM

## 2022-05-23 ENCOUNTER — Ambulatory Visit: Payer: Medicare Other | Admitting: Physical Therapy

## 2022-05-23 ENCOUNTER — Encounter: Payer: Self-pay | Admitting: Physical Therapy

## 2022-05-23 ENCOUNTER — Other Ambulatory Visit: Payer: Self-pay | Admitting: Family

## 2022-05-23 DIAGNOSIS — R252 Cramp and spasm: Secondary | ICD-10-CM

## 2022-05-23 DIAGNOSIS — M6281 Muscle weakness (generalized): Secondary | ICD-10-CM

## 2022-05-23 DIAGNOSIS — G8929 Other chronic pain: Secondary | ICD-10-CM

## 2022-05-23 DIAGNOSIS — M25512 Pain in left shoulder: Secondary | ICD-10-CM | POA: Diagnosis not present

## 2022-05-23 NOTE — Therapy (Signed)
OUTPATIENT PHYSICAL THERAPY Treatment Note PHYSICAL THERAPY DISCHARGE SUMMARY  Visits from Start of Care: 6  Current functional level related to goals / functional outcomes: Met all goals, minimal L shoulder pain, DASH 2.3% impairment   Remaining deficits: Occasional L shoulder pain when sleeping on side   Education / Equipment: HEP  Plan: Patient agrees to discharge.  Patient is being discharged due to meeting the stated rehab goals.      Patient Name: Mario Carpenter MRN: [id number] DOB:11/26/1955, 66 y.o., male Today's Date: 05/23/2022   PT End of Session - 05/23/22 0807     Visit Number 6    Number of Visits 12    Date for PT Re-Evaluation 05/23/22    Authorization Type Medicare + [employer]    PT Start Time 0805    Activity Tolerance Patient tolerated treatment well    Behavior During Therapy [facility] for tasks assessed/performed                Past Medical History:  Diagnosis Date   Alcoholism (Mario Carpenter)    Resolved   Arthritis    Foot pain, right    History of bronchitis    History of chicken pox    Hypertension    Melanoma in situ (Mario Carpenter) 2021   left shoulder (Dr. Charm Carpenter. Mario Carpenter).   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   Procedure: LEFT TOTAL HIP ARTHROPLASTY ANTERIOR APPROACH;  Surgeon: Mario Arabian, MD;  Location: [facility];  Service: Orthopedics;  Laterality: Left;   WISDOM TOOTH EXTRACTION     Patient Active Problem List   Diagnosis Date Noted   Trigger middle finger of right hand 04/04/2022   Partial thickness rotator cuff tear 03/23/2022   Acute low back pain without sciatica 03/23/2022   Anemia 03/23/2022   Alcoholism (Mario Carpenter) 08/04/2021   Arthritis 08/04/2021   History of bronchitis 08/04/2021   History of chicken pox 08/04/2021   Nocturia 08/04/2021    Urinary frequency 08/04/2021   Palpitations 07/06/2021   Melanoma in situ (Mario Carpenter) 2021   Left arm pain 04/29/2017   Erectile dysfunction 12/21/2015   HTN (hypertension) 12/08/2014   BPH with obstruction/lower urinary tract symptoms 09/22/2014   Routine general medical examination at a health care facility 09/20/2013   Cervical disc disease 03/24/2013   Annual physical exam 09/16/2012   Pure hypercholesterolemia 11/11/2010   Psoriasis 11/11/2010   Chronic pain in right foot 11/11/2010    PCP: Mario Alar, NP  REFERRING PROVIDER: Rosemarie Ax, MD  REFERRING DIAG: [phone] (ICD-10-CM) - Nontraumatic incomplete tear of left rotator cuff  THERAPY DIAG:  Chronic left shoulder pain  Muscle weakness (generalized)  Cramp and spasm  Rationale for Evaluation and Treatment Rehabilitation  ONSET DATE:  couple months   SUBJECTIVE:  SUBJECTIVE STATEMENT: Mario Carpenter reports he is doing well, played golf yesterday.  No pain with golf.  Reports just has pain here and there "no rhyme or reason."  PERTINENT HISTORY: PMH: L THA, LBP, L knee pain, palpitations  PAIN:  Are you having pain? No  PRECAUTIONS: None  WEIGHT BEARING RESTRICTIONS No  FALLS:  Has patient fallen in last 6 months? No  LIVING ENVIRONMENT: Lives with: lives with their spouse Lives in: House/apartment Stairs: Yes: Internal: 12 steps; on left going up Has following equipment at home: None  OCCUPATION: Sell pool tables/hot tubs, etc  PLOF: Independent with basic ADLs.  Does yoga, plays golf  PATIENT GOALS move arm better, not hurt when sleep  OBJECTIVE:   DIAGNOSTIC FINDINGS:  [place] Limited Joint Space L shoulder Absent biceps tendon within the bicipital groove. Overlying effusion of the subscapularis. The  anterior leaflet of the supraspinatus has a full-thickness tear. There is a subacromial bursitis   Summary: Partial complete tear of the supraspinatus  PATIENT SURVEYS:  Quick Dash 18.2% impairment  COGNITION:  Overall cognitive status: Within functional limits for tasks assessed     SENSATION: WFL  POSTURE: Slightly forward head, noted atrophy over L supraspinatus  UPPER EXTREMITY ROM:   Active ROM Right eval Left eval  Shoulder flexion 165 155  Shoulder extension 65 76  Shoulder abduction 150 160  Shoulder adduction    Shoulder internal rotation Functional T11 Functional  T11  Shoulder external rotation Functional T4 Functional T4  (Blank rows = not tested)  UPPER EXTREMITY MMT:  MMT Right eval Left eval  Shoulder flexion 5 5  Shoulder extension    Shoulder abduction 5 5  Shoulder adduction    Shoulder internal rotation 5 5  Shoulder external rotation 5 5  Middle trapezius    Lower trapezius    Elbow flexion 5 5  Elbow extension 5 5  Wrist flexion    Wrist extension    Grip strength (lbs)    (Blank rows = not tested)  SHOULDER SPECIAL TESTS:  Impingement tests: Painful arc test: positive  80 deg  Rotator cuff assessment: Full can test: positive    JOINT MOBILITY TESTING:  NA  PALPATION:  Noted atrophy over L supraspinatus, tenderness anterior shoulder RTC attachment/LH biceps tendon   TODAY'S TREATMENT:  05/23/2022 Therapeutic Exercise: to improve strength and mobility.  Demo, verbal and tactile cues throughout for technique. UBE L2 x 6 min (3 f/3b) Wall angels 2 x 10 Thoracic mobilization on foam roller Back stroke on foam roller x 10 bil  Open book stretches x 10 bil  Standing open book stretch x 10 bil  Standing self mobs with foam roller  Manual Therapy: to decrease muscle spasm and pain and improve mobility.  STM/TPR to L lats and subscap, IASTM with s/s tools to lats and serratus.    05/16/2022 Therapeutic Exercise: to improve strength  and mobility.  Demo, verbal and tactile cues throughout for technique. Nustep L5 x 5 min  Lat pull downs 20#, 1 x 15 35# 1 x 15, 45# 1 x 15 , 55# 1 x 15 At wall - serratus raises with red theraband and foam roller 2 x 10 Walk ups at wall with red theraband x 10  Wall push-ups 2 x 10  Wall angels x 10 - manual therapy to latissimus before repeating exercise with  no pain.  Latissimus stretch at wall and with foam roller On foam roller lengthwise: Shoulder flexion/extension x 10 bil  Y-arms on foam roller x 10 Demo thoracic self mobs  Manual Therapy: to decrease muscle spasm and pain and improve mobility STM/IASTM to R lats to decrease pain.  Palpable trigger point noted.   05/10/22 Therapeutic Exercise: UBE 2.0 (43f3b) Standing row green TB x 10 Standing ext green TB x 10 Standing isometric ER with green TB x 10 - 5 sec hold Seated horiz ABD green TB 2x10 Standing ball circles on wall x 30 sec Prone shld flexion 3# weight x 15 Prone shld ext 3# weight x 10  Manual Therapy: STM to L biceps proximal attachments     PATIENT EDUCATION: Education details: progressed HEP for strengthening and thoracic mobilization, 05/23/22.  Person educated: Patient Education method: Explanation, Demonstration, Verbal cues, and Handouts Education comprehension: verbalized understanding and returned demonstration   HOME EXERCISE PROGRAM: Access Code: [email or web address] ASSESSMENT:  CLINICAL IMPRESSION: CPIPER ALBROhas made good progress and met all goals.  He is able to participate in all desired activities now with only occasional L shoulder pain.  He reported some tightness still in L lats, but had no tightness following manual therapy and could perform overhead activties easily.  He feels confident with HEP and is ready and agreeable to discharge today.  He is planning on joining gym to continue to build strength and maintain mobility.   OBJECTIVE IMPAIRMENTS decreased activity tolerance,  decreased endurance, decreased strength, increased fascial restrictions, impaired perceived functional ability, increased muscle spasms, impaired UE functional use, and pain.   ACTIVITY LIMITATIONS carrying, lifting, bending, and reach over head  PARTICIPATION LIMITATIONS: community activity and yard work  PERSONAL FACTORS Time since onset of injury/illness/exacerbation and 1-2 comorbidities: L RTC tear, psoriasis  are also affecting patient's functional outcome.   REHAB POTENTIAL: Excellent  CLINICAL DECISION MAKING: Stable/uncomplicated  EVALUATION COMPLEXITY: Low   GOALS: Goals reviewed with patient? No  SHORT TERM GOALS: Target date: 04/25/2022   Patient will be independent with initial HEP.  Baseline: given Goal status: MET (04/25/22)   LONG TERM GOALS: Target date: 05/23/2022   Patient will be independent with advanced/ongoing HEP to improve outcomes and carryover.  Baseline: needs progression Goal status: MET 05/23/2022  2.  Patient will report 75% improvement in L shoulder pain to improve QOL.  Baseline: pain with overhead movements Goal status: MET 05/23/22- at least 75%, no pain currently  3.  Patient will report <10% impairment on QuickDash  to demonstrate improved functional ability.  Baseline: 18.2% disability Goal status: MET 05/23/2022- 2.3%  4.  Patient will be able to perform overhead activities without increased L shoulder pain.    Baseline: increased pain and crepitus Goal status: MET 05/10/22 - able to place 4lb weight onto shelf OH w/o increased pain  05/23/22- 9 holes of golf without pain.    PLAN: PT FREQUENCY: 1-2x/week  PT DURATION: 6 weeks  PLANNED INTERVENTIONS: Therapeutic exercises, Therapeutic activity, Neuromuscular re-education, Balance training, Gait training, Patient/Family education, Self Care, Joint mobilization, Dry Needling, Electrical stimulation, Spinal mobilization, Cryotherapy, Moist heat, Taping, Ultrasound, Ionotophoresis 413mml  Dexamethasone, Manual therapy, and Re-evaluation  PLAN FOR NEXT SESSION: [place], [place] 05/23/2022, 8:41 AM

## 2022-08-05 ENCOUNTER — Other Ambulatory Visit: Payer: Self-pay | Admitting: Family

## 2022-09-05 ENCOUNTER — Other Ambulatory Visit: Payer: Self-pay | Admitting: Family

## 2022-09-26 ENCOUNTER — Telehealth: Payer: Self-pay | Admitting: Family

## 2022-09-26 ENCOUNTER — Ambulatory Visit (INDEPENDENT_AMBULATORY_CARE_PROVIDER_SITE_OTHER): Payer: Medicare Other | Admitting: Family

## 2022-09-26 ENCOUNTER — Encounter: Payer: Self-pay | Admitting: Family

## 2022-09-26 VITALS — BP 148/82 | HR 69 | Temp 97.5°F | Resp 16 | Ht 74.0 in | Wt 236.0 lb

## 2022-09-26 DIAGNOSIS — E7800 Pure hypercholesterolemia, unspecified: Secondary | ICD-10-CM

## 2022-09-26 DIAGNOSIS — I10 Essential (primary) hypertension: Secondary | ICD-10-CM

## 2022-09-26 DIAGNOSIS — D649 Anemia, unspecified: Secondary | ICD-10-CM

## 2022-09-26 DIAGNOSIS — N138 Other obstructive and reflux uropathy: Secondary | ICD-10-CM

## 2022-09-26 DIAGNOSIS — D039 Melanoma in situ, unspecified: Secondary | ICD-10-CM | POA: Diagnosis not present

## 2022-09-26 DIAGNOSIS — N529 Male erectile dysfunction, unspecified: Secondary | ICD-10-CM

## 2022-09-26 DIAGNOSIS — Z23 Encounter for immunization: Secondary | ICD-10-CM | POA: Diagnosis not present

## 2022-09-26 DIAGNOSIS — N401 Enlarged prostate with lower urinary tract symptoms: Secondary | ICD-10-CM

## 2022-09-26 DIAGNOSIS — Z87891 Personal history of nicotine dependence: Secondary | ICD-10-CM | POA: Diagnosis not present

## 2022-09-26 LAB — COMPREHENSIVE METABOLIC PANEL
ALT: 15 U/L (ref 0–53)
AST: 17 U/L (ref 0–37)
Albumin: 4.2 g/dL (ref 3.5–5.2)
Alkaline Phosphatase: 49 U/L (ref 39–117)
BUN: 16 mg/dL (ref 6–23)
CO2: 29 mEq/L (ref 19–32)
Calcium: 9.4 mg/dL (ref 8.4–10.5)
Chloride: 103 mEq/L (ref 96–112)
Creatinine, Ser: 0.9 mg/dL (ref 0.40–1.50)
GFR: 88.87 mL/min (ref >60.00)
Glucose, Bld: 91 mg/dL (ref 70–99)
Potassium: 4.3 mEq/L (ref 3.5–5.1)
Sodium: 137 mEq/L (ref 135–145)
Total Bilirubin: 0.9 mg/dL (ref 0.2–1.2)
Total Protein: 6.7 g/dL (ref 6.0–8.3)

## 2022-09-26 MED ORDER — AMLODIPINE BESYLATE 5 MG PO TABS: 7.5000 mg | ORAL_TABLET | Freq: Every day | ORAL | 0 refills | Status: DC

## 2022-09-26 NOTE — Patient Instructions (Signed)
Please increase amlodipine to 1.5 tabs once daily.

## 2022-09-26 NOTE — Progress Notes (Signed)
Subjective:   By signing my name below, I, Mario Carpenter, attest that this documentation has been prepared under the direction and in the presence of [name], NP. 09/26/2022   Patient ID: Mario Carpenter, male    DOB: 06/25/1956, 67 y.o.   MRN: [id number]  Chief Complaint  Patient presents with   Hypertension    Here for follow up    Hypertension   Patient is in today for a follow up visit.   Viral illness: He reports recovering from a viral illness recently. He tested negative for flu and Covid-19.   Urination/GU: He reports no new issues urinating while taking 5 mg finasteride in the morning and 0.4 mg Flomax at night. He continues taking daily cialis for BPH.  He uses viagra prn for ED, but has not used  Blood pressure: His blood pressure is elevated during this visit. He continues taking 5 mg amlodipine daily PO and reports no new issues while taking it. He is no longer taking nitroglycerin patches at this time.  BP Readings from Last 3 Encounters:  09/26/22 (!) 148/82  04/04/22 140/72  03/23/22 126/69   Pulse Readings from Last 3 Encounters:  09/26/22 69  03/23/22 72  12/08/21 62   Lung cancer screening: He is eligible for annual lung cancer screening and is interested in it.   Rotator cuff: He continues following up with his sports medicine specialist for his rotator cuff issues.   Sciatica: He reports occasionally waking up in the middle of the night with a dull pain going down in his leg. Otherwise he has no recent sciatica flare ups.   Immunizations: He is due for his second shingles vaccine. He received his first vaccine [facility]. He does not have the latest Covid-19 vaccine.    Past Medical History:  Diagnosis Date   Alcoholism [facility])    Resolved   Arthritis    Foot pain, right    History of bronchitis    History of chicken pox    Hypertension    Melanoma in situ [facility]) 2021   left shoulder (Dr. Charm Barges. Allyn Kenner).    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   Procedure: LEFT TOTAL HIP ARTHROPLASTY ANTERIOR APPROACH;  Surgeon: Gaynelle Arabian, MD;  Location: [facility];  Service: Orthopedics;  Laterality: Left;   WISDOM TOOTH EXTRACTION      Family History  Problem Relation Age of Onset   Heart disease Mother 64       Deceased   Heart defect Mother        hx of rheumatic fever, valve replacement   Diabetes Son        type 1   Diabetes Father 52       Deceased   Healthy Brother        x1   Heart attack Sister        68, stent in LAD   Hypertension Sister    Diabetes Sister     Social History   Socioeconomic History   Marital status: Married    Spouse name: Not on file   Number of children: Not on file   Years of education: Not on file   Highest education level: Not  on file  Occupational History   Not on file  Tobacco Use   Smoking status: Former    Packs/day: 2.00    Years: 25.00    Total pack years: 50.00    Types: Cigarettes    Passive exposure: Current   Smokeless tobacco: Never  Vaping Use   Vaping Use: Never used  Substance and Sexual Activity   Alcohol use: No    Comment: hx of alcoholism; quit 15 years ago    Drug use: No    Comment: cocaine,marijuana use in past quit 20 years ago    Sexual activity: Not on file  Other Topics Concern   Not on file  Social History Narrative   Works at [employer]   2 sons (one in [place] and one in [place])   2 stepdaughters   Enjoys golf   Married (second marriage)      Social Determinants of Health   Financial Resource Strain: [place]  (02/28/2022)   Overall Financial Resource Strain (CARDIA)    Difficulty of Paying Living Expenses: Not hard at all  Food Insecurity: No [place] (02/28/2022)   Hunger Vital Sign    Worried About Running Out of  Food in the Last Year: Never true    [place] in the Last Year: Never true  Transportation Needs: No Transportation Needs (02/28/2022)   PRAPARE - Hydrologist (Medical): No    Lack of Transportation (Non-Medical): No  Physical Activity: Sufficiently Active (02/28/2022)   Exercise Vital Sign    Days of Exercise per Week: 4 days    Minutes of Exercise per Session: 60 min  Stress: No Stress Concern Present (02/28/2022)   [place]    Feeling of Stress : Not at all  Social Connections: Moderately Integrated (02/28/2022)   Social Connection and Isolation Panel [NHANES]    Frequency of Communication with Friends and Family: More than three times a week    Frequency of Social Gatherings with Friends and Family: More than three times a week    Attends Religious Services: 1 to 4 times per year    Active Member of [employer] or Organizations: No    Attends Archivist Meetings: Never    Marital Status: Married  Human resources officer Violence: Not At Risk (02/28/2022)   Humiliation, Afraid, Rape, and Kick questionnaire    Fear of Current or Ex-Partner: No    Emotionally Abused: No    Physically Abused: No    Sexually Abused: No    Outpatient Medications Prior to Visit  Medication Sig Dispense Refill   aspirin 81 MG tablet Take 81 mg by mouth daily.     finasteride (PROSCAR) 5 MG tablet TAKE 1 TABLET DAILY 30 tablet 0   meloxicam (MOBIC) 15 MG tablet TAKE ONE TABLET BY MOUTH ONE TIME DAILY 30 tablet 0   Multiple Vitamins-Minerals (MULTIVITAMIN WITH MINERALS) tablet Take 1 tablet by mouth daily.     sildenafil (VIAGRA) 100 MG tablet Take 100 mg by mouth daily as needed for erectile dysfunction.     tadalafil (CIALIS) 5 MG tablet Take 1 tablet (5 mg total) by mouth daily at 6 (six) AM. 30 tablet 11   tamsulosin (FLOMAX) 0.4 MG CAPS capsule TAKE ONE CAPSULE BY MOUTH ONE TIME DAILY 90 capsule 1    amLODipine (NORVASC) 5 MG tablet TAKE ONE TABLET BY MOUTH ONE TIME DAILY 90 tablet 1  Azelastine-Fluticasone 137-50 MCG/ACT SUSP Place 1 spray into the nose every 12 (twelve) hours. 23 g 0   nitroGLYCERIN (NITRODUR - DOSED IN MG/24 HR) 0.2 mg/hr patch Cut and apply 1/4 patch to most painful area q24h. 30 patch 11   Zoster Vaccine Adjuvanted [facility]) injection Inject 0.11mg IM now and then repeat in 2-6 months 0.5 mL 1   No facility-administered medications prior to visit.    No Known Allergies  Review of Systems  Neurological:  Positive for tingling (occaisnoal left hand numbness).       Objective:    Physical Exam Constitutional:      General: He is not in acute distress.    Appearance: Normal appearance. He is not ill-appearing.  HENT:     Head: Normocephalic and atraumatic.     Right Ear: External ear normal.     Left Ear: External ear normal.  Eyes:     Extraocular Movements: Extraocular movements intact.     Pupils: Pupils are equal, round, and reactive to light.  Cardiovascular:     Rate and Rhythm: Normal rate and regular rhythm.     Heart sounds: Normal heart sounds. No murmur heard.    No gallop.     Comments: His blood pressure measured 148/82 during manual recheck. Pulmonary:     Effort: Pulmonary effort is normal. No respiratory distress.     Breath sounds: Normal breath sounds. No wheezing or rales.  Skin:    General: Skin is warm and dry.  Neurological:     Mental Status: He is alert and oriented to person, place, and time.  Psychiatric:        Judgment: Judgment normal.     BP (!) 148/82   Pulse 69   Temp (!) 97.5 F (36.4 C) (Oral)   Resp 16   Ht '6\' 2"'$  (1.88 m)   Wt 236 lb (107 kg)   SpO2 100%   BMI 30.30 kg/m  Wt Readings from Last 3 Encounters:  09/26/22 236 lb (107 kg)  04/25/22 220 lb (99.8 kg)  04/04/22 225 lb (102.1 kg)       Assessment & Plan:  History of tobacco abuse -     CT CHEST LUNG CANCER SCREENING LOW DOSE WO CONTRAST;  Future  Pure hypercholesterolemia Assessment & Plan: Lab Results  Component Value Date   CHOL 187 02/08/2021   HDL 58.50 02/08/2021   LDLCALC 117 (H) 02/08/2021   TRIG 55.0 02/08/2021   CHOLHDL 3 02/08/2021  Last lipid panel was stable without medications.    Melanoma in situ, unspecified site ([facility] Assessment & Plan: Continues with CJarvis Newcomerfor routine skinchecks.    Anemia, unspecified type Assessment & Plan: Lab Results  Component Value Date   WBC 5.8 03/23/2022   HGB 13.7 03/23/2022   HCT 41.3 03/23/2022   MCV 89.4 03/23/2022   PLT 252.0 03/23/2022   Last cbc was WNL.    Primary hypertension Assessment & Plan: BP is above goal.  Will increase amlodipine to 1.5 tabs (7.'5mg'$ ) once daily.  He also plans to work on diet/exercise.   Orders: -     Comprehensive metabolic panel  BPH with obstruction/lower urinary tract symptoms Assessment & Plan: Stable on current regimen, followed by urology.    Erectile dysfunction, unspecified erectile dysfunction type Assessment & Plan: Stable, followed by urology.    Other orders -     amLODIPine Besylate; Take 1.5 tablets (7.5 mg total) by mouth daily.  Dispense: 135 tablet;  Refill: 0   Flu shot today.   I, Nance Pear, NP, personally preformed the services described in this documentation.  All medical record entries made by the scribe were at my direction and in my presence.  I have reviewed the chart and discharge instructions (if applicable) and agree that the record reflects my personal performance and is accurate and complete. 09/26/2022   I,Mario Carpenter,acting as a Education administrator for Nance Pear, NP.,have documented all relevant documentation on the behalf of Nance Pear, NP,as directed by  Nance Pear, NP while in the presence of Nance Pear, NP.   Nance Pear, NP

## 2022-09-26 NOTE — Telephone Encounter (Signed)
Please call [facility] and request date of shingrix.

## 2022-09-26 NOTE — Assessment & Plan Note (Addendum)
Continues with Jarvis Newcomer for routine skinchecks.

## 2022-09-26 NOTE — Assessment & Plan Note (Signed)
BP is above goal.  Will increase amlodipine to 1.5 tabs (7.'5mg'$ ) once daily.  He also plans to work on diet/exercise.

## 2022-09-26 NOTE — Assessment & Plan Note (Addendum)
Lab Results  Component Value Date   WBC 5.8 03/23/2022   HGB 13.7 03/23/2022   HCT 41.3 03/23/2022   MCV 89.4 03/23/2022   PLT 252.0 03/23/2022   Last cbc was WNL.

## 2022-09-26 NOTE — Assessment & Plan Note (Addendum)
Lab Results  Component Value Date   CHOL 187 02/08/2021   HDL 58.50 02/08/2021   LDLCALC 117 (H) 02/08/2021   TRIG 55.0 02/08/2021   CHOLHDL 3 02/08/2021  Last lipid panel was stable without medications.

## 2022-09-26 NOTE — Addendum Note (Signed)
Addended by: Jiles Prows on: 09/26/2022 10:30 AM   Modules accepted: Orders

## 2022-09-26 NOTE — Telephone Encounter (Signed)
Fyi Immunization records updated as shingrix given 07-11-2022. One administration only so far.

## 2022-09-26 NOTE — Assessment & Plan Note (Addendum)
Stable on current regimen, followed by urology.

## 2022-09-26 NOTE — Assessment & Plan Note (Signed)
Stable, followed by urology.

## 2022-10-04 ENCOUNTER — Encounter: Payer: Self-pay | Admitting: Family

## 2022-10-10 ENCOUNTER — Ambulatory Visit (HOSPITAL_BASED_OUTPATIENT_CLINIC_OR_DEPARTMENT_OTHER)
Admission: RE | Admit: 2022-10-10 | Discharge: 2022-10-10 | Disposition: A | Discharge status: Home / Self Care | Payer: Medicare Other | Source: Ambulatory Visit | Attending: Family | Admitting: Family

## 2022-10-10 DIAGNOSIS — Z87891 Personal history of nicotine dependence: Secondary | ICD-10-CM

## 2022-10-18 ENCOUNTER — Other Ambulatory Visit: Payer: Self-pay | Admitting: Family

## 2022-10-26 ENCOUNTER — Ambulatory Visit: Payer: Medicare Other | Admitting: Family

## 2022-10-31 NOTE — Progress Notes (Signed)
Subjective:   By signing my name below, I, Marlana Latus, attest that this documentation has been prepared under the direction and in the presence of Nance Pear, NP 11/01/22   Patient ID: Mario Carpenter, male    DOB: 02-03-1956, 67 y.o.   MRN: [id number]  Chief Complaint  Patient presents with   Hypertension    Here for follow up   Cough    Complains of congestion and cough x4 days. Negative at home covid test Saturday    HPI Patient is in today for a 1 month follow up.   Blood pressure: He has been compliant with Amlodipine 5 mg daily. He has not been monitoring his blood pressure at home but believes he will look for his machine.  BP Readings from Last 3 Encounters:  11/01/22 131/65  09/26/22 (!) 148/82  04/04/22 140/72   Diet/Exercise: He has been trying to better his eating habits and exercise more. He is interested in joining a gym and exercising with  light weights.   Cold: He began feeling ill since Saturday. He took an at home Covid test which came back negative. He endorses a cough, mucus production, and wheezing. He stated he could hear himself wheeze.  Immunizations: He confirms he has received his shingles shots  Past Medical History:  Diagnosis Date   Alcoholism ([place])    Resolved   Arthritis    Foot pain, right    History of bronchitis    History of chicken pox    Hypertension    Melanoma in situ [facility]) 2021   left shoulder (Dr. Charm Barges. Allyn Kenner).   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   Procedure: LEFT TOTAL HIP ARTHROPLASTY ANTERIOR APPROACH;  Surgeon: Gaynelle Arabian, MD;  Location: [facility];  Service: Orthopedics;  Laterality: Left;   WISDOM TOOTH EXTRACTION      Family History  Problem Relation Age of Onset   Heart disease Mother 49       Deceased    Heart defect Mother        hx of rheumatic fever, valve replacement   Diabetes Son        type 1   Diabetes Father 29       Deceased   Healthy Brother        x1   Heart attack Sister        38, stent in LAD   Hypertension Sister    Diabetes Sister     Social History   Socioeconomic History   Marital status: Married    Spouse name: Not on file   Number of children: Not on file   Years of education: Not on file   Highest education level: Not on file  Occupational History   Not on file  Tobacco Use   Smoking status: Former    Packs/day: 2.00    Years: 25.00    Total pack years: 50.00    Types: Cigarettes    Passive exposure: Current   Smokeless tobacco: Never  Vaping Use   Vaping Use: Never used  Substance and Sexual Activity   Alcohol use: No    Comment: hx of alcoholism; quit 15 years ago    Drug use: No  Comment: cocaine,marijuana use in past quit 20 years ago    Sexual activity: Not on file  Other Topics Concern   Not on file  Social History Narrative   Works at [employer]   2 sons (one in [place] and one in [place])   2 stepdaughters   Enjoys golf   Married (second marriage)      Social Determinants of Health   Financial Resource Strain: Low Risk  (02/28/2022)   Overall Financial Resource Strain (CARDIA)    Difficulty of Paying Living Expenses: Not hard at all  Food Insecurity: No [place] (02/28/2022)   Hunger Vital Sign    Worried About Running Out of Food in the Last Year: Never true    [place] in the Last Year: Never true  Transportation Needs: No Transportation Needs (02/28/2022)   PRAPARE - Hydrologist (Medical): No    Lack of Transportation (Non-Medical): No  Physical Activity: Sufficiently Active (02/28/2022)   Exercise Vital Sign    Days of Exercise per Week: 4 days    Minutes of Exercise per Session: 60 min  Stress: No Stress Concern Present (02/28/2022)   [place]    Feeling of Stress : Not at all  Social Connections: Moderately Integrated (02/28/2022)   Social Connection and Isolation Panel [NHANES]    Frequency of Communication with Friends and Family: More than three times a week    Frequency of Social Gatherings with Friends and Family: More than three times a week    Attends Religious Services: 1 to 4 times per year    Active Member of [employer] or Organizations: No    Attends Archivist Meetings: Never    Marital Status: Married  Human resources officer Violence: Not At Risk (02/28/2022)   Humiliation, Afraid, Rape, and Kick questionnaire    Fear of Current or Ex-Partner: No    Emotionally Abused: No    Physically Abused: No    Sexually Abused: No    Outpatient Medications Prior to Visit  Medication Sig Dispense Refill   aspirin 81 MG tablet Take 81 mg by mouth daily.     finasteride (PROSCAR) 5 MG tablet TAKE 1 TABLET DAILY 30 tablet 0   meloxicam (MOBIC) 15 MG tablet TAKE ONE TABLET BY MOUTH ONE TIME DAILY 30 tablet 0   Multiple Vitamins-Minerals (MULTIVITAMIN WITH MINERALS) tablet Take 1 tablet by mouth daily.     sildenafil (VIAGRA) 100 MG tablet Take 100 mg by mouth daily as needed for erectile dysfunction.     tadalafil (CIALIS) 5 MG tablet Take 1 tablet (5 mg total) by mouth daily at 6 (six) AM. 30 tablet 11   tamsulosin (FLOMAX) 0.4 MG CAPS capsule TAKE ONE CAPSULE BY MOUTH ONE TIME DAILY 90 capsule 0   amLODipine (NORVASC) 5 MG tablet Take 1.5 tablets (7.5 mg total) by mouth daily. 135 tablet 0   No facility-administered medications prior to visit.    No Known Allergies  Review of Systems  Respiratory:  Positive for cough, sputum production and wheezing.        Objective:    Physical Exam Constitutional:      General: He is not in acute distress.    Appearance: He is well-developed.  HENT:     Head: Normocephalic and atraumatic.     Right Ear: Tympanic membrane  and ear canal normal.     Left Ear: Tympanic membrane  and ear canal normal.  Cardiovascular:     Rate and Rhythm: Normal rate and regular rhythm.     Heart sounds: No murmur heard. Pulmonary:     Effort: Pulmonary effort is normal. No respiratory distress.     Breath sounds: Normal breath sounds. No wheezing or rales.  Musculoskeletal:     Cervical back: Neck supple.  Skin:    General: Skin is warm and dry.  Neurological:     Mental Status: He is alert and oriented to person, place, and time.  Psychiatric:        Behavior: Behavior normal.        Thought Content: Thought content normal.     BP 131/65 (BP Location: Left Arm, Patient Position: Sitting, Cuff Size: Large)   Pulse 73   Temp (!) 97.4 F (36.3 C) (Oral)   Resp 16   Wt 233 lb (105.7 kg)   SpO2 99%   BMI 29.92 kg/m  Wt Readings from Last 3 Encounters:  11/01/22 233 lb (105.7 kg)  09/26/22 236 lb (107 kg)  04/25/22 220 lb (99.8 kg)       Assessment & Plan:  Primary hypertension Assessment & Plan: BP stable on '5mg'$  once daily.  He did not raise the dose as we discussed last visit but has been focusing more on healthy diet and exercise. Advised OK to continue amlodipine '5mg'$  once daily. Will recheck in 4 months. He will monitor at home in the meantime.    Viral upper respiratory tract infection Assessment & Plan: New. Improving. Negative home covid test. Normal exam. Continue supportive measures.    Other orders -     amLODIPine Besylate; Take 1 tablet (5 mg total) by mouth daily.  Dispense: 135 tablet; Refill: 0    Reviewed after patient left pharmacy records- he had shingrix #1 but not Shingrix #2. Plan shingrix #2 next visit.   I,Rachel Rivera,acting as a Education administrator for Nance Pear, NP.,have documented all relevant documentation on the behalf of Nance Pear, NP,as directed by  Nance Pear, NP while in the presence of Nance Pear, NP.   I, Nance Pear, NP,  personally preformed the services described in this documentation.  All medical record entries made by the scribe were at my direction and in my presence.  I have reviewed the chart and discharge instructions (if applicable) and agree that the record reflects my personal performance and is accurate and complete. 11/01/22   Nance Pear, NP

## 2022-11-01 ENCOUNTER — Ambulatory Visit (INDEPENDENT_AMBULATORY_CARE_PROVIDER_SITE_OTHER): Payer: Medicare Other | Admitting: Family

## 2022-11-01 ENCOUNTER — Telehealth: Payer: Self-pay | Admitting: Family

## 2022-11-01 VITALS — BP 131/65 | HR 73 | Temp 97.4°F | Resp 16 | Wt 233.0 lb

## 2022-11-01 DIAGNOSIS — I10 Essential (primary) hypertension: Secondary | ICD-10-CM | POA: Diagnosis not present

## 2022-11-01 DIAGNOSIS — J069 Acute upper respiratory infection, unspecified: Secondary | ICD-10-CM | POA: Diagnosis not present

## 2022-11-01 MED ORDER — AMLODIPINE BESYLATE 5 MG PO TABS: 5.0000 mg | ORAL_TABLET | Freq: Every day | ORAL | 0 refills | Status: DC

## 2022-11-01 NOTE — Telephone Encounter (Signed)
See Mychart.

## 2022-11-01 NOTE — Assessment & Plan Note (Signed)
New. Improving. Negative home covid test. Normal exam. Continue supportive measures.

## 2022-11-01 NOTE — Assessment & Plan Note (Signed)
BP stable on '5mg'$  once daily.  He did not raise the dose as we discussed last visit but has been focusing more on healthy diet and exercise. Advised OK to continue amlodipine '5mg'$  once daily. Will recheck in 4 months. He will monitor at home in the meantime.

## 2022-11-10 ENCOUNTER — Encounter: Payer: Self-pay | Admitting: Family Medicine

## 2022-11-10 ENCOUNTER — Other Ambulatory Visit (HOSPITAL_BASED_OUTPATIENT_CLINIC_OR_DEPARTMENT_OTHER): Payer: Self-pay

## 2022-11-10 ENCOUNTER — Ambulatory Visit (INDEPENDENT_AMBULATORY_CARE_PROVIDER_SITE_OTHER): Payer: Medicare Other | Admitting: Family Medicine

## 2022-11-10 VITALS — BP 140/70 | HR 78 | Temp 97.9°F | Resp 18 | Ht 74.0 in | Wt 234.0 lb

## 2022-11-10 DIAGNOSIS — J019 Acute sinusitis, unspecified: Secondary | ICD-10-CM

## 2022-11-10 MED ORDER — AMOXICILLIN-POT CLAVULANATE 875-125 MG PO TABS
1.0000 | ORAL_TABLET | Freq: Two times a day (BID) | ORAL | 0 refills | Status: DC
  Filled 2022-11-10: qty 20, 10d supply, fill #0

## 2022-11-10 MED ORDER — ALBUTEROL SULFATE HFA 108 (90 BASE) MCG/ACT IN AERS
2.0000 | INHALATION_SPRAY | Freq: Four times a day (QID) | RESPIRATORY_TRACT | 0 refills | Status: DC | PRN
  Filled 2022-11-10: qty 6.7, 20d supply, fill #0

## 2022-11-10 MED ORDER — BENZONATATE 200 MG PO CAPS
200.0000 mg | ORAL_CAPSULE | Freq: Two times a day (BID) | ORAL | 0 refills | Status: DC | PRN
  Filled 2022-11-10: qty 20, 10d supply, fill #0

## 2022-11-10 NOTE — Progress Notes (Signed)
Acute Office Visit  Subjective:     Patient ID: Mario Carpenter, male    DOB: 1956/05/12, 67 y.o.   MRN: [id number]  Chief Complaint  Patient presents with   Cough    11/01/22       Upper Respiratory Infection: Patient complains of symptoms of a URI. Symptoms include  productive cough (brown/yellow sputum), fatigue, chills, rhinorrhea, sneezing, occasional wheezing and shortness of breath . Onset of symptoms was > 10 days ago, fluctuated  since that time.   He is drinking plenty of fluids. Evaluation to date: seen previously and thought to have a viral URI. Treatment to date:  dayquil/nyquil . Felt a little better 2 days ago, then symptoms worsened again. Trying to get rest. No fevers, chest pain, nausea vomiting, diarrhea.      All review of systems negative except what is listed in the HPI       Objective:    BP (!) 140/70   Pulse 78   Temp 97.9 F (36.6 C)   Resp 18   Ht '6\' 2"'$  (1.88 m)   Wt 234 lb (106.1 kg)   SpO2 99%   BMI 30.04 kg/m    Physical Exam Vitals reviewed.  Constitutional:      Appearance: Normal appearance.  HENT:     Head: Normocephalic and atraumatic.     Right Ear: Tympanic membrane normal.     Left Ear: Tympanic membrane normal.     Nose: Rhinorrhea present.     Comments: [place], PND    Mouth/Throat:     Mouth: Mucous membranes are moist.     Pharynx: Oropharynx is clear. No oropharyngeal exudate or posterior oropharyngeal erythema.  Eyes:     Extraocular Movements: Extraocular movements intact.     Conjunctiva/sclera: Conjunctivae normal.  Cardiovascular:     Rate and Rhythm: Normal rate and regular rhythm.     Pulses: Normal pulses.     Heart sounds: Normal heart sounds.  Pulmonary:     Effort: Pulmonary effort is normal.     Breath sounds: Wheezing present. No rhonchi or rales.  Musculoskeletal:     Cervical back: Normal range of motion and neck supple. No tenderness.  Lymphadenopathy:     Cervical: No cervical  adenopathy.  Skin:    General: Skin is warm and dry.  Neurological:     Mental Status: He is alert and oriented to person, place, and time.  Psychiatric:        Mood and Affect: Mood normal.        Behavior: Behavior normal.        Thought Content: Thought content normal.        Judgment: Judgment normal.     No results found for any visits on 11/10/22.      Assessment & Plan:   Problem List Items Addressed This Visit   None Visit Diagnoses     Acute non-recurrent sinusitis, unspecified location    -  Primary Given duration and double sickening, start ABX. Adding albuterol for occasional wheezing and dyspnea, and tessalon. Continue supportive measures including rest, hydration, humidifier use, steam showers, warm compresses to sinuses, warm liquids with lemon and honey, and over-the-counter cough, cold, and analgesics as needed.  Patient aware of signs/symptoms requiring further/urgent evaluation.      Relevant Medications   amoxicillin-clavulanate (AUGMENTIN) 875-125 MG tablet   albuterol (VENTOLIN HFA) 108 (90 Base) MCG/ACT inhaler   benzonatate (TESSALON) 200 MG capsule  Meds ordered this encounter  Medications   amoxicillin-clavulanate (AUGMENTIN) 875-125 MG tablet    Sig: Take 1 tablet by mouth 2 (two) times daily.    Dispense:  20 tablet    Refill:  0    Order Specific Question:   Supervising Provider    Answer:   Penni Homans A [[id number]]   albuterol (VENTOLIN HFA) 108 (90 Base) MCG/ACT inhaler    Sig: Inhale 2 puffs into the lungs every 6 (six) hours as needed for wheezing or shortness of breath.    Dispense:  6.7 g    Refill:  0    Order Specific Question:   Supervising Provider    Answer:   Penni Homans A [[id number]]   benzonatate (TESSALON) 200 MG capsule    Sig: Take 1 capsule (200 mg total) by mouth 2 (two) times daily as needed for cough.    Dispense:  20 capsule    Refill:  0    Order Specific Question:   Supervising Provider    Answer:   Penni Homans A [id number]    Return if symptoms worsen or fail to improve.  Terrilyn Saver, NP

## 2022-11-15 ENCOUNTER — Other Ambulatory Visit: Payer: Self-pay | Admitting: Family

## 2022-11-17 ENCOUNTER — Ambulatory Visit (INDEPENDENT_AMBULATORY_CARE_PROVIDER_SITE_OTHER): Payer: Medicare Other | Admitting: Family Medicine

## 2022-11-17 ENCOUNTER — Encounter: Payer: Self-pay | Admitting: Family Medicine

## 2022-11-17 VITALS — BP 132/76 | Ht 74.0 in | Wt 234.0 lb

## 2022-11-17 DIAGNOSIS — S46211A Strain of muscle, fascia and tendon of other parts of biceps, right arm, initial encounter: Secondary | ICD-10-CM | POA: Insufficient documentation

## 2022-11-17 NOTE — Assessment & Plan Note (Signed)
Acutely occurring.  Has symptoms consistent with biceps tendon rupture of the long head.  Has no pain today and has good range of motion. -Counseled on home exercise therapy and supportive care. -Could consider injection or physical therapy

## 2022-11-17 NOTE — Progress Notes (Signed)
  Mario Carpenter - 67 y.o. male MRN [id number]  Date of birth: 08/12/1956  SUBJECTIVE:  Including CC & ROS.  No chief complaint on file.   Mario Carpenter is a 67 y.o. male that is presenting with acute right shoulder pain.  He was picking up his dog yesterday and felt a pop in his right arm.  He is having swelling and bruising.  He has good range of motion today and no significant pain at this moment..    Review of Systems See HPI   HISTORY: Past Medical, Surgical, Social, and Family History Reviewed & Updated per EMR.   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 Charm Barges. Allyn Kenner).   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   Procedure: LEFT TOTAL HIP ARTHROPLASTY ANTERIOR APPROACH;  Surgeon: Gaynelle Arabian, MD;  Location: [facility];  Service: Orthopedics;  Laterality: Left;   WISDOM TOOTH EXTRACTION       PHYSICAL EXAM:  VS: BP 132/76 (BP Location: Left Arm, Patient Position: Sitting)   Ht 6\' 2"  (1.88 m)   Wt 234 lb (106.1 kg)   BMI 30.04 kg/m  Physical Exam Gen: NAD, alert, cooperative with exam, well-appearing MSK:  Neurovascularly intact       ASSESSMENT & PLAN:   Rupture of right biceps tendon Acutely occurring.  Has symptoms consistent with biceps tendon rupture of the long head.  Has no pain today and has good range of motion. -Counseled on home exercise therapy and supportive care. -Could consider injection or physical therapy

## 2022-11-17 NOTE — Patient Instructions (Signed)
Good to see you ?Please try heat  ?Please try the exercises   ?Please send me a message in MyChart with any questions or updates.  ?Please see me back as needed.  ? ?--Dr. Hosanna Betley ? ?

## 2022-12-19 ENCOUNTER — Encounter: Payer: Self-pay | Admitting: *Deleted

## 2022-12-27 ENCOUNTER — Other Ambulatory Visit: Payer: Self-pay | Admitting: Family

## 2023-01-14 ENCOUNTER — Other Ambulatory Visit: Payer: Self-pay | Admitting: Family

## 2023-01-23 ENCOUNTER — Other Ambulatory Visit: Payer: Self-pay | Admitting: Family

## 2023-01-23 MED ORDER — FINASTERIDE 5 MG PO TABS: 5.0000 mg | ORAL_TABLET | Freq: Every day | ORAL | 1 refills | Status: DC

## 2023-03-03 ENCOUNTER — Ambulatory Visit (INDEPENDENT_AMBULATORY_CARE_PROVIDER_SITE_OTHER): Payer: Medicare Other | Admitting: *Deleted

## 2023-03-03 VITALS — BP 137/72 | HR 67 | Ht 74.0 in | Wt 233.0 lb

## 2023-03-03 DIAGNOSIS — Z0000 Encounter for general adult medical examination without abnormal findings: Secondary | ICD-10-CM | POA: Diagnosis not present

## 2023-03-03 NOTE — Patient Instructions (Signed)
Mr. Mario Carpenter , Thank you for taking time to come for your Medicare Wellness Visit. I appreciate your ongoing commitment to your health goals. Please review the following plan we discussed and let me know if I can assist you in the future.     This is a list of the screening recommended for you and due dates:  Health Maintenance  Topic Date Due   COVID-19 Vaccine (3 - Pfizer risk series) 01/06/2020   Zoster (Shingles) Vaccine (2 of 2) 09/05/2022   Pneumonia Vaccine (2 of 2 - PCV) 03/24/2023*   Flu Shot  04/06/2023   DTaP/Tdap/Td vaccine (2 - Td or Tdap) 09/21/2023   Screening for Lung Cancer  10/11/2023   Medicare Annual Wellness Visit  03/02/2024   Colon Cancer Screening  01/14/2027   HPV Vaccine  Aged Out   Hepatitis C Screening  Discontinued  *Topic was postponed. The date shown is not the original due date.    Next appointment: Follow up in one year for your annual wellness visit.   Preventive Care 15 Years and Older, Male Preventive care refers to lifestyle choices and visits with your health care provider that can promote health and wellness. What does preventive care include? A yearly physical exam. This is also called an annual well check. Dental exams once or twice a year. Routine eye exams. Ask your health care provider how often you should have your eyes checked. Personal lifestyle choices, including: Daily care of your teeth and gums. Regular physical activity. Eating a healthy diet. Avoiding tobacco and drug use. Limiting alcohol use. Practicing safe sex. Taking low doses of aspirin every day. Taking vitamin and mineral supplements as recommended by your health care provider. What happens during an annual well check? The services and screenings done by your health care provider during your annual well check will depend on your age, overall health, lifestyle risk factors, and family history of disease. Counseling  Your health care provider may ask you questions  about your: Alcohol use. Tobacco use. Drug use. Emotional well-being. Home and relationship well-being. Sexual activity. Eating habits. History of falls. Memory and ability to understand (cognition). Work and work Astronomer. Screening  You may have the following tests or measurements: Height, weight, and BMI. Blood pressure. Lipid and cholesterol levels. These may be checked every 5 years, or more frequently if you are over 43 years old. Skin check. Lung cancer screening. You may have this screening every year starting at age 13 if you have a 30-pack-year history of smoking and currently smoke or have quit within the past 15 years. Fecal occult blood test (FOBT) of the stool. You may have this test every year starting at age 68. Flexible sigmoidoscopy or colonoscopy. You may have a sigmoidoscopy every 5 years or a colonoscopy every 10 years starting at age 9. Prostate cancer screening. Recommendations will vary depending on your family history and other risks. Hepatitis C blood test. Hepatitis B blood test. Sexually transmitted disease (STD) testing. Diabetes screening. This is done by checking your blood sugar (glucose) after you have not eaten for a while (fasting). You may have this done every 1-3 years. Abdominal aortic aneurysm (AAA) screening. You may need this if you are a current or former smoker. Osteoporosis. You may be screened starting at age 68 if you are at high risk. Talk with your health care provider about your test results, treatment options, and if necessary, the need for more tests. Vaccines  Your health care provider may recommend  certain vaccines, such as: Influenza vaccine. This is recommended every year. Tetanus, diphtheria, and acellular pertussis (Tdap, Td) vaccine. You may need a Td booster every 10 years. Zoster vaccine. You may need this after age 56. Pneumococcal 13-valent conjugate (PCV13) vaccine. One dose is recommended after age 37. Pneumococcal  polysaccharide (PPSV23) vaccine. One dose is recommended after age 23. Talk to your health care provider about which screenings and vaccines you need and how often you need them. This information is not intended to replace advice given to you by your health care provider. Make sure you discuss any questions you have with your health care provider. Document Released: 09/18/2015 Document Revised: 05/11/2016 Document Reviewed: 06/23/2015 Elsevier Interactive Patient Education  2017 [place] Prevention in the Home Falls can cause injuries. They can happen to people of all ages. There are many things you can do to make your home safe and to help prevent falls. What can I do on the outside of my home? Regularly fix the edges of walkways and driveways and fix any cracks. Remove anything that might make you trip as you walk through a door, such as a raised step or threshold. Trim any bushes or trees on the path to your home. Use bright outdoor lighting. Clear any walking paths of anything that might make someone trip, such as rocks or tools. Regularly check to see if handrails are loose or broken. Make sure that both sides of any steps have handrails. Any raised decks and porches should have guardrails on the edges. Have any leaves, snow, or ice cleared regularly. Use sand or salt on walking paths during winter. Clean up any spills in your garage right away. This includes oil or grease spills. What can I do in the bathroom? Use night lights. Install grab bars by the toilet and in the tub and shower. Do not use towel bars as grab bars. Use non-skid mats or decals in the tub or shower. If you need to sit down in the shower, use a plastic, non-slip stool. Keep the floor dry. Clean up any water that spills on the floor as soon as it happens. Remove soap buildup in the tub or shower regularly. Attach bath mats securely with double-sided non-slip rug tape. Do not have throw rugs and other  things on the floor that can make you trip. What can I do in the bedroom? Use night lights. Make sure that you have a light by your bed that is easy to reach. Do not use any sheets or blankets that are too big for your bed. They should not hang down onto the floor. Have a firm chair that has side arms. You can use this for support while you get dressed. Do not have throw rugs and other things on the floor that can make you trip. What can I do in the kitchen? Clean up any spills right away. Avoid walking on wet floors. Keep items that you use a lot in easy-to-reach places. If you need to reach something above you, use a strong step stool that has a grab bar. Keep electrical cords out of the way. Do not use floor polish or wax that makes floors slippery. If you must use wax, use non-skid floor wax. Do not have throw rugs and other things on the floor that can make you trip. What can I do with my stairs? Do not leave any items on the stairs. Make sure that there are handrails on both sides of the  stairs and use them. Fix handrails that are broken or loose. Make sure that handrails are as long as the stairways. Check any carpeting to make sure that it is firmly attached to the stairs. Fix any carpet that is loose or worn. Avoid having throw rugs at the top or bottom of the stairs. If you do have throw rugs, attach them to the floor with carpet tape. Make sure that you have a light switch at the top of the stairs and the bottom of the stairs. If you do not have them, ask someone to add them for you. What else can I do to help prevent falls? Wear shoes that: Do not have high heels. Have rubber bottoms. Are comfortable and fit you well. Are closed at the toe. Do not wear sandals. If you use a stepladder: Make sure that it is fully opened. Do not climb a closed stepladder. Make sure that both sides of the stepladder are locked into place. Ask someone to hold it for you, if possible. Clearly  mark and make sure that you can see: Any grab bars or handrails. First and last steps. Where the edge of each step is. Use tools that help you move around (mobility aids) if they are needed. These include: Canes. Walkers. Scooters. Crutches. Turn on the lights when you go into a dark area. Replace any light bulbs as soon as they burn out. Set up your furniture so you have a clear path. Avoid moving your furniture around. If any of your floors are uneven, fix them. If there are any pets around you, be aware of where they are. Review your medicines with your doctor. Some medicines can make you feel dizzy. This can increase your chance of falling. Ask your doctor what other things that you can do to help prevent falls. This information is not intended to replace advice given to you by your health care provider. Make sure you discuss any questions you have with your health care provider. Document Released: 06/18/2009 Document Revised: 01/28/2016 Document Reviewed: 09/26/2014 Elsevier Interactive Patient Education  2017 [employer].

## 2023-03-03 NOTE — Progress Notes (Signed)
Subjective:   Mario Carpenter is a 67 y.o. male who presents for Medicare Annual/Subsequent preventive examination.  Visit Complete: In person   Review of Systems     Cardiac Risk Factors include: advanced age (>74men, >57 women);male gender;hypertension     Objective:    Today's Vitals   03/03/23 0820  BP: 137/72  Pulse: 67  Weight: 233 lb (105.7 kg)  Height: 6\' 2"  (1.88 m)   Body mass index is 29.92 kg/m.     03/03/2023    8:22 AM 04/11/2022    2:07 PM 02/28/2022    8:34 AM 01/13/2017    7:35 AM 08/12/2016    8:30 PM 08/12/2016   12:56 PM 08/04/2016   10:07 AM  Advanced Directives  Does Patient Have a Medical Advance Directive? Yes Yes Yes Yes Yes Yes No  Type of Estate agent of [place];Living will Living will Healthcare Power of [place];Living will Healthcare Power of [place];Living will Healthcare Power of [employer] Power of Attorney   Does patient want to make changes to medical advance directive? No - Patient declined No - Patient declined   No - Patient declined    Copy of Healthcare Power of Attorney in Chart? Yes - validated most recent copy scanned in chart (See row information)  Yes - validated most recent copy scanned in chart (See row information) No - copy requested Yes Yes   Would patient like information on creating a medical advance directive?     No - Patient declined  Yes (MAU/Ambulatory/Procedural Areas - Information given)    Current Medications (verified) Outpatient Encounter Medications as of 03/03/2023  Medication Sig   amLODipine (NORVASC) 5 MG tablet TAKE ONE AND ONE-HALF TABLETS BY MOUTH DAILY   aspirin 81 MG tablet Take 81 mg by mouth daily.   finasteride (PROSCAR) 5 MG tablet Take 1 tablet (5 mg total) by mouth daily.   meloxicam (MOBIC) 15 MG tablet TAKE ONE TABLET BY MOUTH ONE TIME DAILY (Patient taking differently: Take 7.5 mg by mouth daily as needed for pain.)   Multiple Vitamins-Minerals  (MULTIVITAMIN WITH MINERALS) tablet Take 1 tablet by mouth daily.   sildenafil (VIAGRA) 100 MG tablet Take 100 mg by mouth daily as needed for erectile dysfunction.   tadalafil (CIALIS) 5 MG tablet Take 1 tablet (5 mg total) by mouth daily at 6 (six) AM.   tamsulosin (FLOMAX) 0.4 MG CAPS capsule TAKE ONE CAPSULE BY MOUTH ONE TIME DAILY   [DISCONTINUED] albuterol (VENTOLIN HFA) 108 (90 Base) MCG/ACT inhaler Inhale 2 puffs into the lungs every 6 (six) hours as needed for wheezing or shortness of breath.   [DISCONTINUED] amoxicillin-clavulanate (AUGMENTIN) 875-125 MG tablet Take 1 tablet by mouth 2 (two) times daily.   [DISCONTINUED] benzonatate (TESSALON) 200 MG capsule Take 1 capsule (200 mg total) by mouth 2 (two) times daily as needed for cough.   No facility-administered encounter medications on file as of 03/03/2023.    Allergies (verified) Patient has no known allergies.   History: Past Medical History:  Diagnosis Date   Alcoholism ([facility])    Resolved   Arthritis    Foot pain, right    History of bronchitis    History of chicken pox    Hypertension    Melanoma in situ [facility]) 2021   left shoulder (Dr. Ronny Flurry. Nita Sells).   Nocturia    Psoriasis    Urinary frequency    Past Surgical History:  Procedure Laterality Date   APPENDECTOMY  HERNIA REPAIR     KNEE SURGERY     right times 2   TOTAL HIP ARTHROPLASTY Left 08/12/2016   Procedure: LEFT TOTAL HIP ARTHROPLASTY ANTERIOR APPROACH;  Surgeon: Ollen Gross, MD;  Location: [facility];  Service: Orthopedics;  Laterality: Left;   WISDOM TOOTH EXTRACTION     Family History  Problem Relation Age of Onset   Heart disease Mother 53       Deceased   Heart defect Mother        hx of rheumatic fever, valve replacement   Diabetes Son        type 1   Diabetes Father 46       Deceased   Healthy Brother        x1   Heart attack Sister        16, stent in LAD   Hypertension Sister    Diabetes Sister    Social History    Socioeconomic History   Marital status: Married    Spouse name: Not on file   Number of children: Not on file   Years of education: Not on file   Highest education level: Not on file  Occupational History   Not on file  Tobacco Use   Smoking status: Former    Packs/day: 2.00    Years: 25.00    Additional pack years: 0.00    Total pack years: 50.00    Types: Cigarettes    Passive exposure: Current   Smokeless tobacco: Never  Vaping Use   Vaping Use: Never used  Substance and Sexual Activity   Alcohol use: No    Comment: hx of alcoholism; quit 15 years ago    Drug use: No    Comment: cocaine,marijuana use in past quit 20 years ago    Sexual activity: Not on file  Other Topics Concern   Not on file  Social History Narrative   Works at [employer]   2 sons (one in [place] and one in [place])   2 stepdaughters   Enjoys golf   Married (second marriage)      Social Determinants of Health   Financial Resource Strain: Low Risk  (02/28/2022)   Overall Financial Resource Strain (CARDIA)    Difficulty of Paying Living Expenses: Not hard at all  Food Insecurity: No Food Insecurity (03/03/2023)   Hunger Vital Sign    Worried About Running Out of Food in the Last Year: Never true    Ran Out of Food in the Last Year: Never true  Transportation Needs: No Transportation Needs (03/03/2023)   PRAPARE - Administrator, Civil Service (Medical): No    Lack of Transportation (Non-Medical): No  Physical Activity: Sufficiently Active (03/03/2023)   Exercise Vital Sign    Days of Exercise per Week: 2 days    Minutes of Exercise per Session: 100 min  Stress: No Stress Concern Present (02/28/2022)   [employer] of Occupational Health - Occupational Stress Questionnaire    Feeling of Stress : Not at all  Social Connections: Moderately Integrated (02/28/2022)   Social Connection and Isolation Panel [NHANES]    Frequency of Communication with Friends and Family: More  than three times a week    Frequency of Social Gatherings with Friends and Family: More than three times a week    Attends Religious Services: 1 to 4 times per year    Active Member of [employer] or Organizations: No    Attends Banker Meetings:  Never    Marital Status: Married    Tobacco Counseling Counseling given: Not Answered   Clinical Intake:  Pre-visit preparation completed: Yes  Pain : No/denies pain  BMI - recorded: 29.92 Nutritional Status: BMI 25 -29 Overweight Nutritional Risks: None Diabetes: No  How often do you need to have someone help you when you read instructions, pamphlets, or other written materials from your doctor or pharmacy?: 1 - Never  Interpreter Needed?: No  Information entered by :: Carrick Rijos,CMA   Activities of Daily Living    03/03/2023    8:23 AM  In your present state of health, do you have any difficulty performing the following activities:  Hearing? 0  Vision? 0  Difficulty concentrating or making decisions? 0  Walking or climbing stairs? 0  Dressing or bathing? 0  Doing errands, shopping? 0  Preparing Food and eating ? N  Using the Toilet? N  In the past six months, have you accidently leaked urine? Y  Do you have problems with loss of bowel control? N  Managing your Medications? N  Managing your Finances? N  Housekeeping or managing your Housekeeping? N    Patient Care Team: Sandford Craze, NP as PCP - General (Internal Medicine) Sandford Craze, NP (Internal Medicine)  Indicate any recent Medical Services you may have received from other than [facility] providers in the past year (date may be approximate).     Assessment:   This is a routine wellness examination for [place].  Hearing/Vision screen No results found.  Dietary issues and exercise activities discussed:     Goals Addressed   None    Depression Screen    03/03/2023    8:28 AM 02/28/2022    8:37 AM 02/08/2021    9:05 AM 11/30/2020     9:27 AM 10/25/2017    9:30 AM 07/22/2016    9:44 AM  PHQ 2/9 Scores  PHQ - 2 Score 0 0 0 0 0 0  PHQ- 9 Score     3     Fall Risk    03/03/2023    8:23 AM 02/28/2022    8:35 AM 02/08/2021    9:05 AM 07/22/2016    9:44 AM  Fall Risk   Falls in the past year? 0 0 0 No  Number falls in past yr: 0 0 0   Injury with Fall? 0 0 0   Risk for fall due to : No Fall Risks     Follow up Falls evaluation completed Falls prevention discussed      MEDICARE RISK AT HOME:  Medicare Risk at Home - 03/03/23 0825     Any stairs in or around the home? Yes    If so, are there any without handrails? No    Home free of loose throw rugs in walkways, pet beds, electrical cords, etc? Yes    Adequate lighting in your home to reduce risk of falls? Yes    Life alert? No    Use of a cane, walker or w/c? No    Grab bars in the bathroom? No    Shower chair or bench in shower? No    Elevated toilet seat or a handicapped toilet? No             TIMED UP AND GO:  Was the test performed?  Yes  Length of time to ambulate 10 feet: 5 sec Gait steady and fast without use of assistive device    Cognitive Function:  03/03/2023    8:29 AM  6CIT Screen  What Year? 0 points  What month? 0 points  What time? 0 points  Count back from 20 0 points  Months in reverse 0 points  Repeat phrase 2 points  Total Score 2 points    Immunizations Immunization History  Administered Date(s) Administered   Fluad Quad(high Dose 65+) 09/26/2022   Influenza, Seasonal, Injecte, Preservative Fre 09/12/2012   Influenza,inj,Quad PF,6+ Mos 07/26/2015, 07/22/2016, 07/09/2018, 07/03/2019   PFIZER(Purple Top)SARS-COV-2 Vaccination 11/16/2019, 12/09/2019   Pneumococcal Polysaccharide-23 02/08/2021   Tdap 09/20/2013   Zoster Recombinat (Shingrix) 07/11/2022    TDAP status: Up to date  Flu Vaccine status: Up to date  Pneumococcal vaccine status: Due, Education has been provided regarding the importance of this  vaccine. Advised may receive this vaccine at local pharmacy or Health Dept. Aware to provide a copy of the vaccination record if obtained from local pharmacy or Health Dept. Verbalized acceptance and understanding.  Covid-19 vaccine status: Information provided on how to obtain vaccines.   Qualifies for Shingles Vaccine? Yes   Zostavax completed No   Shingrix Completed?: No.    Education has been provided regarding the importance of this vaccine. Patient has been advised to call insurance company to determine out of pocket expense if they have not yet received this vaccine. Advised may also receive vaccine at local pharmacy or Health Dept. Verbalized acceptance and understanding.  Screening Tests Health Maintenance  Topic Date Due   COVID-19 Vaccine (3 - Pfizer risk series) 01/06/2020   Zoster Vaccines- Shingrix (2 of 2) 09/05/2022   Pneumonia Vaccine 84+ Years old (2 of 2 - PCV) 03/24/2023 (Originally 02/08/2022)   INFLUENZA VACCINE  04/06/2023   DTaP/Tdap/Td (2 - Td or Tdap) 09/21/2023   Lung Cancer Screening  10/11/2023   Medicare Annual Wellness (AWV)  03/02/2024   Colonoscopy  01/14/2027   HPV VACCINES  Aged Out   Hepatitis C Screening  Discontinued    Health Maintenance  Health Maintenance Due  Topic Date Due   COVID-19 Vaccine (3 - Pfizer risk series) 01/06/2020   Zoster Vaccines- Shingrix (2 of 2) 09/05/2022    Colorectal cancer screening: Type of screening: Colonoscopy. Completed 01/13/17. Repeat every 10 years  Lung Cancer Screening: (Low Dose CT Chest recommended if Age 20-80 years, 20 pack-year currently smoking OR have quit w/in 15years.) does qualify.   Lung Cancer Screening Referral: Last scan completed on 10/10/22  Additional Screening:  Hepatitis C Screening: does qualify; Completed N/a  Vision Screening: Recommended annual ophthalmology exams for early detection of glaucoma and other disorders of the eye. Is the patient up to date with their annual eye exam?  No   Who is the provider or what is the name of the office in which the patient attends annual eye exams? Doesn't have one at this time If pt is not established with a provider, would they like to be referred to a provider to establish care? No .   Dental Screening: Recommended annual dental exams for proper oral hygiene  Diabetic Foot Exam: N/a  Community Resource Referral / Chronic Care Management: CRR required this visit?  No   CCM required this visit?  No     Plan:     I have personally reviewed and noted the following in the patient's chart:   Medical and social history Use of alcohol, tobacco or illicit drugs  Current medications and supplements including opioid prescriptions. Patient is not currently taking opioid prescriptions. Functional  ability and status Nutritional status Physical activity Advanced directives List of other physicians Hospitalizations, surgeries, and ER visits in previous 12 months Vitals Screenings to include cognitive, depression, and falls Referrals and appointments  In addition, I have reviewed and discussed with patient certain preventive protocols, quality metrics, and best practice recommendations. A written personalized care plan for preventive services as well as general preventive health recommendations were provided to patient.     Donne Anon, CMA   03/03/2023   After Visit Summary: Sent to mychart  Nurse Notes: None

## 2023-04-26 ENCOUNTER — Other Ambulatory Visit: Payer: Self-pay | Admitting: Family

## 2023-05-02 ENCOUNTER — Telehealth: Payer: Self-pay | Admitting: Family

## 2023-05-02 DIAGNOSIS — M6740 Ganglion, unspecified site: Secondary | ICD-10-CM

## 2023-05-02 NOTE — Telephone Encounter (Signed)
Called patient but no answer, left voice mail for patient to call back.   

## 2023-05-02 NOTE — Telephone Encounter (Signed)
Pt returned call to discuss.  Please call back.

## 2023-05-02 NOTE — Telephone Encounter (Signed)
The patient called requesting to speak with Windell Moulding or Efraim Kaufmann for advice on which orthopedic office Melissa might recommend. I informed the patient that he may need to schedule an appointment to obtain a referral, but he doesn't believe a referral or appointment is necessary. Please call and advise the patient.

## 2023-05-03 NOTE — Telephone Encounter (Signed)
I went ahead and placed the orthopedic referral.

## 2023-05-03 NOTE — Telephone Encounter (Signed)
Patient received call from ortho and is scheduled to be seen next tuesday

## 2023-05-03 NOTE — Addendum Note (Signed)
Addended by: Sandford Craze on: 05/03/2023 11:24 AM   Modules accepted: Orders

## 2023-05-09 ENCOUNTER — Encounter: Payer: Self-pay | Admitting: Orthopaedic Surgery

## 2023-05-09 ENCOUNTER — Other Ambulatory Visit (INDEPENDENT_AMBULATORY_CARE_PROVIDER_SITE_OTHER): Payer: Medicare Other

## 2023-05-09 ENCOUNTER — Ambulatory Visit (INDEPENDENT_AMBULATORY_CARE_PROVIDER_SITE_OTHER): Payer: Medicare Other | Admitting: Orthopaedic Surgery

## 2023-05-09 DIAGNOSIS — M25532 Pain in left wrist: Secondary | ICD-10-CM

## 2023-05-09 NOTE — Progress Notes (Signed)
Office Visit Note   Patient: Mario Carpenter           Date of Birth: 03/18/1956           MRN: [id number] Visit Date: 05/09/2023              Requested by: Sandford Craze, NP [address] [place],  [place] [address] PCP: Sandford Craze, NP   Assessment & Plan: Visit Diagnoses:  1. 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  Treatment options were explained and patient would like to explore surgical treatment therefore I will make referral to Dr. Fara Boros for this.  Questions encouraged and answered.  Follow-Up Instructions: No follow-ups on file.   Orders:  Orders Placed This Encounter  Procedures   XR Wrist Complete Left   No orders of the defined types were placed in this encounter.     Procedures: No procedures performed   Clinical Data: No additional findings.   Subjective: Chief Complaint  Patient presents with   Left Wrist - Pain    HPI Patient is a very pleasant 67 year old gentleman here for evaluation of symptomatic left volar wrist ganglion cyst that he has had for many years but has gotten more painful.  In the past the cyst would resolve temporarily but now its size has remained the same for the last few years.  He has discomfort when he uses his left hand.  Review of Systems  Constitutional: Negative.   HENT: Negative.    Eyes: Negative.   Respiratory: Negative.    Cardiovascular: Negative.   Gastrointestinal: Negative.   Endocrine: Negative.   Genitourinary: Negative.   Skin: Negative.   Allergic/Immunologic: Negative.   Neurological: Negative.   Hematological: Negative.   Psychiatric/Behavioral: Negative.    All other systems reviewed and are negative.    Objective: Vital Signs: There were no vitals taken for this visit.  Physical Exam Vitals and nursing note reviewed.  Constitutional:      Appearance: He is  well-developed.  HENT:     Head: Normocephalic and atraumatic.  Eyes:     Pupils: Pupils are equal, round, and reactive to light.  Pulmonary:     Effort: Pulmonary effort is normal.  Abdominal:     Palpations: Abdomen is soft.  Musculoskeletal:        General: Normal range of motion.     Cervical back: Neck supple.  Skin:    General: Skin is warm.  Neurological:     Mental Status: He is alert and oriented to person, place, and time.  Psychiatric:        Behavior: Behavior normal.        Thought Content: Thought content normal.        Judgment: Judgment normal.     Ortho Exam Examination of the left wrist shows mild limitation in flexion and extension.  Mild pain with radial deviation.  He has a palpable firm volar wrist ganglion cyst.  Strong radial pulse. Specialty Comments:  No specialty comments available.  Imaging: XR Wrist Complete Left  Result Date: 05/09/2023 X-rays demonstrate advanced DJD of the radiocarpal joint with subchondral cystic changes in the distal radius, scaphoid and trapezium    PMFS History: Patient Active Problem List   Diagnosis Date Noted   Rupture of right biceps tendon 11/17/2022   History of tobacco abuse 09/26/2022  Trigger middle finger of right hand 04/04/2022   Partial thickness rotator cuff tear 03/23/2022   Acute low back pain without sciatica 03/23/2022   Anemia 03/23/2022   Alcoholism (HCC) 08/04/2021   Arthritis 08/04/2021   History of bronchitis 08/04/2021   Nocturia 08/04/2021   Palpitations 07/06/2021   Melanoma in situ (HCC) 2021   Erectile dysfunction 12/21/2015   HTN (hypertension) 12/08/2014   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 Ronny Flurry. Nita Sells).   Nocturia    Psoriasis    Urinary frequency     Family History  Problem Relation Age of Onset   Heart disease Mother 29       Deceased   Heart defect Mother        hx of rheumatic fever, valve replacement   Diabetes Son        type 1   Diabetes Father 21       Deceased   Healthy Brother        x1   Heart attack Sister        23, stent in LAD   Hypertension Sister    Diabetes Sister     Past Surgical History:  Procedure Laterality Date   APPENDECTOMY     HERNIA REPAIR     KNEE SURGERY     right times 2   TOTAL HIP ARTHROPLASTY Left 08/12/2016   Procedure: LEFT TOTAL HIP ARTHROPLASTY ANTERIOR APPROACH;  Surgeon: Ollen Gross, MD;  Location: [facility];  Service: Orthopedics;  Laterality: Left;   WISDOM TOOTH EXTRACTION     Social History   Occupational History   Not on file  Tobacco Use   Smoking status: Former    Current packs/day: 2.00    Average packs/day: 2.0 packs/day for 25.0 years (50.0 ttl pk-yrs)    Types: Cigarettes    Passive exposure: Current   Smokeless tobacco: Never  Vaping Use   Vaping status: Never Used  Substance and Sexual Activity   Alcohol use: No    Comment: hx of alcoholism; quit 15 years ago    Drug use: No    Comment: cocaine,marijuana use in past quit 20 years ago    Sexual activity: Not on file

## 2023-05-24 ENCOUNTER — Ambulatory Visit (INDEPENDENT_AMBULATORY_CARE_PROVIDER_SITE_OTHER): Payer: Medicare Other | Admitting: Family

## 2023-05-24 VITALS — BP 142/76 | HR 70 | Temp 97.9°F | Resp 18 | Ht 74.0 in | Wt 233.8 lb

## 2023-05-24 DIAGNOSIS — R5383 Other fatigue: Secondary | ICD-10-CM | POA: Insufficient documentation

## 2023-05-24 DIAGNOSIS — R509 Fever, unspecified: Secondary | ICD-10-CM | POA: Diagnosis not present

## 2023-05-24 LAB — COMPREHENSIVE METABOLIC PANEL WITH GFR
ALT: 18 U/L (ref 0–53)
AST: 20 U/L (ref 0–37)
Albumin: 4.2 g/dL (ref 3.5–5.2)
Alkaline Phosphatase: 47 U/L (ref 39–117)
BUN: 12 mg/dL (ref 6–23)
CO2: 29 meq/L (ref 19–32)
Calcium: 9.3 mg/dL (ref 8.4–10.5)
Chloride: 103 meq/L (ref 96–112)
Creatinine, Ser: 1 mg/dL (ref 0.40–1.50)
GFR: 77.95 mL/min (ref >60.00)
Glucose, Bld: 94 mg/dL (ref 70–99)
Potassium: 4.2 meq/L (ref 3.5–5.1)
Sodium: 137 meq/L (ref 135–145)
Total Bilirubin: 1.1 mg/dL (ref 0.2–1.2)
Total Protein: 6.5 g/dL (ref 6.0–8.3)

## 2023-05-24 LAB — TSH: TSH: 2.9 u[IU]/mL (ref 0.35–5.50)

## 2023-05-24 LAB — VITAMIN B12: Vitamin B-12: 469 pg/mL (ref 211–911)

## 2023-05-24 NOTE — Progress Notes (Signed)
Subjective:     Patient ID: Mario Carpenter, male    DOB: Mar 02, 1956, 67 y.o.   MRN: [id number]  Chief Complaint  Patient presents with   Fatigue    HPI  Discussed the use of AI scribe software for clinical note transcription with the patient, who gave verbal consent to proceed.   67 year old male presents to the clinic today for complaints of fatigued, headaches and left eye drainage. Patient states that this has been occurring for about a week now. Not around anyone sick that he is aware of. Covid test was negative at home on 05/20/2023. More run down and fatigued feeling more in the past week. He states he is also having a mild fever off and on in the morning at home. Headaches noted as well in the morning.   Patient states that his diet is not doing well. Patient states that he is drinking a lot of water with no issues. He states that he does try to eat some veggies in his diet but he knows that overall his diet is not looking so well.   He admits that            Health Maintenance Due  Topic Date Due   COVID-19 Vaccine (3 - Pfizer risk series) 01/06/2020   Pneumonia Vaccine 43+ Years old (2 of 2 - PCV) 02/08/2022   Zoster Vaccines- Shingrix (2 of 2) 09/05/2022   INFLUENZA VACCINE  04/06/2023    Past Medical History:  Diagnosis Date   Alcoholism (HCC)    Resolved   Arthritis    Foot pain, right    History of bronchitis    History of chicken pox    Hypertension    Melanoma in situ (HCC) 2021   left shoulder (Dr. Ronny Flurry. Nita Sells).   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   Procedure: LEFT TOTAL HIP ARTHROPLASTY ANTERIOR APPROACH;  Surgeon: Ollen Gross, MD;  Location: [facility];  Service: Orthopedics;  Laterality: Left;   WISDOM TOOTH EXTRACTION      Family History  Problem  Relation Age of Onset   Heart disease Mother 40       Deceased   Heart defect Mother        hx of rheumatic fever, valve replacement   Diabetes Son        type 1   Diabetes Father 47       Deceased   Healthy Brother        x1   Heart attack Sister        99, stent in LAD   Hypertension Sister    Diabetes Sister     Social History   Socioeconomic History   Marital status: Married    Spouse name: Not on file   Number of children: Not on file   Years of education: Not on file   Highest education level: Not on file  Occupational History   Not on file  Tobacco Use   Smoking status: Former    Current packs/day: 2.00    Average packs/day: 2.0 packs/day for 25.0 years (50.0 ttl pk-yrs)    Types: Cigarettes    Passive exposure: Current   Smokeless tobacco: Never  Vaping Use  Vaping status: Never Used  Substance and Sexual Activity   Alcohol use: No    Comment: hx of alcoholism; quit 15 years ago    Drug use: No    Comment: cocaine,marijuana use in past quit 20 years ago    Sexual activity: Not on file  Other Topics Concern   Not on file  Social History Narrative   Works at [employer]   2 sons (one in [place] and one in [place])   2 stepdaughters   Enjoys golf   Married (second marriage)      Social Determinants of Health   Financial Resource Strain: Low Risk  (02/28/2022)   Overall Financial Resource Strain (CARDIA)    Difficulty of Paying Living Expenses: Not hard at all  Food Insecurity: No Food Insecurity (03/03/2023)   Hunger Vital Sign    Worried About Running Out of Food in the Last Year: Never true    Ran Out of Food in the Last Year: Never true  Transportation Needs: No Transportation Needs (03/03/2023)   PRAPARE - Administrator, Civil Service (Medical): No    Lack of Transportation (Non-Medical): No  Physical Activity: Sufficiently Active (03/03/2023)   Exercise Vital Sign    Days of Exercise per Week: 2 days    Minutes of  Exercise per Session: 100 min  Stress: No Stress Concern Present (02/28/2022)   [employer] of Occupational Health - Occupational Stress Questionnaire    Feeling of Stress : Not at all  Social Connections: Moderately Integrated (02/28/2022)   Social Connection and Isolation Panel [NHANES]    Frequency of Communication with Friends and Family: More than three times a week    Frequency of Social Gatherings with Friends and Family: More than three times a week    Attends Religious Services: 1 to 4 times per year    Active Member of [employer] or Organizations: No    Attends Banker Meetings: Never    Marital Status: Married  Catering manager Violence: Not At Risk (03/03/2023)   Humiliation, Afraid, Rape, and Kick questionnaire    Fear of Current or Ex-Partner: No    Emotionally Abused: No    Physically Abused: No    Sexually Abused: No    Outpatient Medications Prior to Visit  Medication Sig Dispense Refill   amLODipine (NORVASC) 5 MG tablet TAKE ONE AND ONE-HALF TABLETS BY MOUTH DAILY 135 tablet 0   aspirin 81 MG tablet Take 81 mg by mouth daily.     finasteride (PROSCAR) 5 MG tablet Take 1 tablet (5 mg total) by mouth daily. 90 tablet 1   meloxicam (MOBIC) 15 MG tablet TAKE ONE TABLET BY MOUTH ONE TIME DAILY (Patient taking differently: Take 7.5 mg by mouth daily as needed for pain.) 30 tablet 0   Multiple Vitamins-Minerals (MULTIVITAMIN WITH MINERALS) tablet Take 1 tablet by mouth daily.     sildenafil (VIAGRA) 100 MG tablet Take 100 mg by mouth daily as needed for erectile dysfunction.     tadalafil (CIALIS) 5 MG tablet Take 1 tablet (5 mg total) by mouth daily at 6 (six) AM. 30 tablet 11   tamsulosin (FLOMAX) 0.4 MG CAPS capsule TAKE ONE CAPSULE BY MOUTH ONCE A DAY 90 capsule 0   No facility-administered medications prior to visit.    No Known Allergies  Review of Systems  Constitutional:  Positive for fever (noted in the mornings off and on) and malaise/fatigue.   HENT:  Negative for ear discharge,  ear pain and hearing loss.   Eyes:  Positive for discharge (left eye has some drainage in the morning).  Respiratory:  Negative for cough and wheezing.   Cardiovascular:  Negative for chest pain and palpitations.  Gastrointestinal:  Negative for heartburn and nausea.  Musculoskeletal:  Negative for myalgias and neck pain.  Skin:  Negative for itching and rash.  Neurological:  Negative for dizziness and headaches.  Psychiatric/Behavioral:  Negative for depression and suicidal ideas.        Objective:    Physical Exam Constitutional:      Appearance: Normal appearance. He is normal weight.  HENT:     Head: Normocephalic.  Eyes:     Conjunctiva/sclera: Conjunctivae normal.     Pupils: Pupils are equal, round, and reactive to light.  Cardiovascular:     Rate and Rhythm: Normal rate and regular rhythm.     Pulses: Normal pulses.     Heart sounds: Normal heart sounds.  Pulmonary:     Effort: Pulmonary effort is normal.     Breath sounds: Normal breath sounds.  Musculoskeletal:        General: Normal range of motion.     Cervical back: Normal range of motion.  Skin:    General: Skin is warm.     Capillary Refill: Capillary refill takes less than 2 seconds.  Neurological:     General: No focal deficit present.     Mental Status: He is alert.  Psychiatric:        Mood and Affect: Mood normal.        Behavior: Behavior normal.        Thought Content: Thought content normal.        Judgment: Judgment normal.      BP (!) 162/74   Pulse 70   Temp 97.9 F (36.6 C)   Resp 18   Ht 6\' 2"  (1.88 m)   Wt 233 lb 12.8 oz (106.1 kg)   SpO2 99%   BMI 30.02 kg/m  Wt Readings from Last 3 Encounters:  05/24/23 233 lb 12.8 oz (106.1 kg)  03/03/23 233 lb (105.7 kg)  11/17/22 234 lb (106.1 kg)       Assessment & Plan:   Problem List Items Addressed This Visit   None   I am having Alcus Dad maintain his aspirin, tadalafil,  multivitamin with minerals, sildenafil, meloxicam, amLODipine, finasteride, and tamsulosin.  No orders of the defined types were placed in this encounter.

## 2023-05-24 NOTE — Assessment & Plan Note (Addendum)
New.  I suspect viral etiology, however I would like to rule out occult UTI, check labs as below.  Encouraged rest, hydration, tylenol prn.  Patient is advised to let me know if symptoms do not improve over the next 1 week or if symptoms worsen.

## 2023-05-24 NOTE — Progress Notes (Signed)
Subjective:     Patient ID: Mario Carpenter, male    DOB: 1955/09/13, 67 y.o.   MRN: [id number]  Chief Complaint  Patient presents with   Fatigue    HPI  Discussed the use of AI scribe software for clinical note transcription with the patient, who gave verbal consent to proceed.  History of Present Illness   The patient, with a history of allergies, presents with a week-long history of general fatigue, malaise, and morning fevers. He reports feeling feverish and experiencing chills, similar to previous COVID illness. He tested for covid at home and he tested negative. He also reports crusting of both eyes, especially the left, with associated redness and itching. He believes this to be allergy related and has restarted taking allertec. He denies any urinary symptoms. He also mentions a dental issue that has been bothering him for a while, but denies any abscess. He is scheduled to see his dentist later this week. He also mentions a ganglion cyst on his left wrist for which he has an appointment scheduled with orthopedics.         Health Maintenance Due  Topic Date Due   COVID-19 Vaccine (3 - Pfizer risk series) 01/06/2020   Pneumonia Vaccine 40+ Years old (2 of 2 - PCV) 02/08/2022   Zoster Vaccines- Shingrix (2 of 2) 09/05/2022   INFLUENZA VACCINE  04/06/2023    Past Medical History:  Diagnosis Date   Alcoholism (HCC)    Resolved   Arthritis    Foot pain, right    History of bronchitis    History of chicken pox    Hypertension    Melanoma in situ (HCC) 2021   left shoulder (Dr. Ronny Flurry. Nita Sells).   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   Procedure: LEFT TOTAL HIP ARTHROPLASTY ANTERIOR APPROACH;  Surgeon: Ollen Gross, MD;  Location: [facility];  Service: Orthopedics;  Laterality: Left;    WISDOM TOOTH EXTRACTION      Family History  Problem Relation Age of Onset   Heart disease Mother 65       Deceased   Heart defect Mother        hx of rheumatic fever, valve replacement   Diabetes Son        type 1   Diabetes Father 30       Deceased   Healthy Brother        x1   Heart attack Sister        55, stent in LAD   Hypertension Sister    Diabetes Sister     Social History   Socioeconomic History   Marital status: Married    Spouse name: Not on file   Number of children: Not on file   Years of education: Not on file   Highest education level: Not on file  Occupational History   Not on file  Tobacco Use   Smoking status: Former    Current packs/day: 2.00    Average packs/day: 2.0 packs/day for 25.0 years (50.0 ttl pk-yrs)    Types: Cigarettes    Passive exposure: Current   Smokeless tobacco: Never  Vaping Use   Vaping status: Never Used  Substance and Sexual Activity   Alcohol  use: No    Comment: hx of alcoholism; quit 15 years ago    Drug use: No    Comment: cocaine,marijuana use in past quit 20 years ago    Sexual activity: Not on file  Other Topics Concern   Not on file  Social History Narrative   Works at [employer]   2 sons (one in [place] and one in [place])   2 stepdaughters   Enjoys golf   Married (second marriage)      Social Determinants of Health   Financial Resource Strain: Low Risk  (02/28/2022)   Overall Financial Resource Strain (CARDIA)    Difficulty of Paying Living Expenses: Not hard at all  Food Insecurity: No Food Insecurity (03/03/2023)   Hunger Vital Sign    Worried About Running Out of Food in the Last Year: Never true    Ran Out of Food in the Last Year: Never true  Transportation Needs: No Transportation Needs (03/03/2023)   PRAPARE - Administrator, Civil Service (Medical): No    Lack of Transportation (Non-Medical): No  Physical Activity: Sufficiently Active (03/03/2023)   Exercise Vital Sign     Days of Exercise per Week: 2 days    Minutes of Exercise per Session: 100 min  Stress: No Stress Concern Present (02/28/2022)   [employer] of Occupational Health - Occupational Stress Questionnaire    Feeling of Stress : Not at all  Social Connections: Moderately Integrated (02/28/2022)   Social Connection and Isolation Panel [NHANES]    Frequency of Communication with Friends and Family: More than three times a week    Frequency of Social Gatherings with Friends and Family: More than three times a week    Attends Religious Services: 1 to 4 times per year    Active Member of [employer] or Organizations: No    Attends Banker Meetings: Never    Marital Status: Married  Catering manager Violence: Not At Risk (03/03/2023)   Humiliation, Afraid, Rape, and Kick questionnaire    Fear of Current or Ex-Partner: No    Emotionally Abused: No    Physically Abused: No    Sexually Abused: No    Outpatient Medications Prior to Visit  Medication Sig Dispense Refill   amLODipine (NORVASC) 5 MG tablet TAKE ONE AND ONE-HALF TABLETS BY MOUTH DAILY 135 tablet 0   aspirin 81 MG tablet Take 81 mg by mouth daily.     finasteride (PROSCAR) 5 MG tablet Take 1 tablet (5 mg total) by mouth daily. 90 tablet 1   meloxicam (MOBIC) 15 MG tablet TAKE ONE TABLET BY MOUTH ONE TIME DAILY (Patient taking differently: Take 7.5 mg by mouth daily as needed for pain.) 30 tablet 0   Multiple Vitamins-Minerals (MULTIVITAMIN WITH MINERALS) tablet Take 1 tablet by mouth daily.     sildenafil (VIAGRA) 100 MG tablet Take 100 mg by mouth daily as needed for erectile dysfunction.     tadalafil (CIALIS) 5 MG tablet Take 1 tablet (5 mg total) by mouth daily at 6 (six) AM. 30 tablet 11   tamsulosin (FLOMAX) 0.4 MG CAPS capsule TAKE ONE CAPSULE BY MOUTH ONCE A DAY 90 capsule 0   No facility-administered medications prior to visit.    No Known Allergies  ROS See HPI    Objective:    Physical Exam Constitutional:       General: He is not in acute distress.    Appearance: He is well-developed.  HENT:  Head: Normocephalic and atraumatic.     Right Ear: Tympanic membrane and ear canal normal.     Left Ear: Tympanic membrane and ear canal normal.     Mouth/Throat:     Mouth: Mucous membranes are moist.     Pharynx: No posterior oropharyngeal erythema.     Tonsils: No tonsillar exudate.  Eyes:     General: No scleral icterus.       Right eye: No discharge.        Left eye: No discharge.     Conjunctiva/sclera: Conjunctivae normal.  Neck:     Thyroid: No thyroid mass.  Cardiovascular:     Rate and Rhythm: Normal rate and regular rhythm.     Heart sounds: No murmur heard. Pulmonary:     Effort: Pulmonary effort is normal. No respiratory distress.     Breath sounds: Normal breath sounds. No wheezing or rales.  Lymphadenopathy:     Cervical: No cervical adenopathy.  Skin:    General: Skin is warm and dry.  Neurological:     Mental Status: He is alert and oriented to person, place, and time.  Psychiatric:        Behavior: Behavior normal.        Thought Content: Thought content normal.      BP (!) 142/76 (BP Location: Right Arm, Patient Position: Sitting, Cuff Size: Large)   Pulse 70   Temp 97.9 F (36.6 C)   Resp 18   Ht 6\' 2"  (1.88 m)   Wt 233 lb 12.8 oz (106.1 kg)   SpO2 99%   BMI 30.02 kg/m  Wt Readings from Last 3 Encounters:  05/24/23 233 lb 12.8 oz (106.1 kg)  03/03/23 233 lb (105.7 kg)  11/17/22 234 lb (106.1 kg)       Assessment & Plan:   Problem List Items Addressed This Visit       Unprioritized   Fatigue    New.  I suspect viral etiology, however I would like to rule out occult UTI, check labs as below.  Encouraged rest, hydration, tylenol prn.  Patient is advised to let me know if symptoms do not improve over the next 1 week or if symptoms worsen.       Relevant Orders   B12   TSH   Comp Met (CMET)   Other Visit Diagnoses     Fever, unspecified fever  cause    -  Primary   Relevant Orders   Urine Culture       I am having Alcus Dad maintain his aspirin, tadalafil, multivitamin with minerals, sildenafil, meloxicam, amLODipine, finasteride, and tamsulosin.  No orders of the defined types were placed in this encounter.

## 2023-05-24 NOTE — Patient Instructions (Signed)
VISIT SUMMARY:  During your visit, we discussed your recent symptoms of fatigue, fever, and chills, as well as redness and crusting in your eyes. You also mentioned a dental issue and a skin lesion. We have ordered some tests to better understand your symptoms and have advised you to continue using Alartec for your eye symptoms. We also discussed the importance of getting your flu shot and COVID-19 booster when you're feeling better.  YOUR PLAN:  -GENERALIZED FATIGUE AND FEVER: You've been feeling tired and feverish for about a week. We're not sure what's causing this, so we've ordered some blood and urine tests to check your overall health and rule out a urinary tract infection. If your symptoms continue for another week and the tests don't show anything, please contact our office for further evaluation.  -CONJUNCTIVITIS: Your eyes are red and crusty, which could be due to allergies. Please continue using Alartec as needed to help with these symptoms.  -SKIN LESION: You've noticed a skin lesion, but you already have an appointment with a dermatologist next week to check it out. We don't need to do anything about it at this time.  INSTRUCTIONS:  Please get your blood and urine tests done as soon as possible. Continue using Alertec for your eye symptoms. If your symptoms of fatigue and fever continue for another week, please contact our office. Also, remember to get your flu shot and COVID-19 booster when you're feeling better.

## 2023-05-25 LAB — URINE CULTURE
MICRO NUMBER:: 15483495
Result:: NO GROWTH
SPECIMEN QUALITY:: ADEQUATE

## 2023-05-29 ENCOUNTER — Ambulatory Visit (INDEPENDENT_AMBULATORY_CARE_PROVIDER_SITE_OTHER): Payer: Medicare Other | Admitting: Orthopedic Surgery

## 2023-05-29 DIAGNOSIS — M67439 Ganglion, unspecified wrist: Secondary | ICD-10-CM | POA: Diagnosis not present

## 2023-05-29 NOTE — Progress Notes (Signed)
Mario Carpenter - 67 y.o. male MRN [id number]  Date of birth: September 03, 1956  Office Visit Note: Visit Date: 05/29/2023 PCP: Sandford Craze, NP Referred by: Sandford Craze, NP  Subjective: No chief complaint on file.  HPI: Mario Carpenter is a pleasant 67 y.o. male who presents today for evaluation of a left wrist volar mass for multiple months.  Currently, it is relatively asymptomatic, will occasionally have pain in this region with heavy use of the left wrist.  No prior treatments or workups.  He was seen recently by Dr. Roda Shutters who referred him to me for evaluation for what appears to be a left wrist volar ganglion cyst.  Pertinent ROS were reviewed with the patient and found to be negative unless otherwise specified above in HPI.   Visit Reason: left wrist volar cyst Duration of symptoms: 3+ months Hand dominance: right Occupation: Salesman Diabetic: No Heart/Lung History: none Blood Thinners: 81 mg aspirin  Prior Testing/EMG: 05/09/23 xrays Injections (Date):none Treatments: none Prior Surgery: none  Assessment & Plan: Visit Diagnoses: No diagnosis found.  Plan: Extensive discussion was had the patient today regarding his left wrist volar ganglion cyst.  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  There is also associated STT arthritis as well.  We discussed potential treatment options from a conservative versus surgical standpoint.  From a conservative standpoint, wrist bracing, activity modification are appropriate given the underlying arthritis.  Particularly given that the cyst in question is not causing significant discomfort at this time, we can take a more conservative approach.  We discussed surgical options in the form of cyst excision as well as the proximity of the radial artery.  His Allen's test today does demonstrate appropriate flow  of the ulnar collateral circulation at the wrist level.  Risks and benefits of the procedure were discussed, risks including but not limited to infection, bleeding, scarring, stiffness, nerve injury, tendon injury, vascular injury, recurrence of symptoms and need for subsequent operation.  Patient expressed understanding.    We also did discuss the significant degenerative change at the radiocarpal joint and potential salvage treatment options in the future from a surgical standpoint should this become more painful.  He expressed full understanding of this as well.  For the time being, we will to conservative approach for the left wrist volar ganglion cyst given that he is relatively asymptomatic currently.  He will return to me in the future for further discussion about potential surgical intervention should it become more symptomatic.  Will trial wrist bracing for the time being.    Follow-up: No follow-ups on file.   Meds & Orders: No orders of the defined types were placed in this encounter.  No orders of the defined types were placed in this encounter.    Procedures: No procedures performed      Clinical History: No specialty comments available.  He reports that he has quit smoking. His smoking use included cigarettes. He has a 50 pack-year smoking history. He has been exposed to tobacco smoke. He has never used smokeless tobacco. No results for input(s): "HGBA1C", "LABURIC" in the last 8760 hours.  Objective:   Vital Signs: There were no vitals taken for this visit.  Physical Exam  Gen: Well-appearing, in no acute distress; non-toxic CV: Regular Rate. Well-perfused. Warm.  Resp: Breathing unlabored on room air; no wheezing. Psych: Fluid speech in conversation; appropriate affect; normal thought  process  Ortho Exam Left wrist: - Palpable mass proximal to the wrist crease, volar radial aspect, measures 2 cm x 2 cm, soft, compressible, mobile, minimally tender - Wrist range of  motion is intact, flexion/extension 65/55, composite fist without restriction - Mild pain with deep palpation of the radiocarpal interval - Sensation intact distally, hand is warm well-perfused - Allen's testing at the wrist level demonstrates appropriate flow both radially and ulnarly  Imaging: No results found.  Past Medical/Family/Surgical/Social History: Medications & Allergies reviewed per EMR, new medications updated. 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 Ronny Flurry. Nita Sells).   Nocturia    Psoriasis    Urinary frequency    Family History  Problem Relation Age of Onset   Heart disease Mother 60       Deceased   Heart defect Mother        hx of rheumatic fever, valve replacement   Diabetes Son        type 1   Diabetes Father 71       Deceased   Healthy Brother        x1   Heart attack Sister        90,  stent in LAD   Hypertension Sister    Diabetes Sister    Past Surgical History:  Procedure Laterality Date   APPENDECTOMY     HERNIA REPAIR     KNEE SURGERY     right times 2   TOTAL HIP ARTHROPLASTY Left 08/12/2016   Procedure: LEFT TOTAL HIP ARTHROPLASTY ANTERIOR APPROACH;  Surgeon: Ollen Gross, MD;  Location: [facility];  Service: Orthopedics;  Laterality: Left;   WISDOM TOOTH EXTRACTION     Social History   Occupational History   Not on file  Tobacco Use   Smoking status: Former    Current packs/day: 2.00    Average packs/day: 2.0 packs/day for 25.0 years (50.0 ttl pk-yrs)    Types: Cigarettes    Passive exposure: Current   Smokeless tobacco: Never  Vaping Use   Vaping status: Never Used  Substance and Sexual Activity   Alcohol use: No    Comment: hx of alcoholism; quit 15 years ago    Drug use: No    Comment: cocaine,marijuana use in past quit 20 years ago    Sexual activity: Not on file  Delon Revelo Trevor Mace, M.D. [facility] 10:13 AM

## 2023-06-26 ENCOUNTER — Other Ambulatory Visit: Payer: Self-pay | Admitting: Family

## 2023-07-26 ENCOUNTER — Other Ambulatory Visit: Payer: Self-pay | Admitting: Family

## 2023-08-06 ENCOUNTER — Other Ambulatory Visit: Payer: Self-pay | Admitting: Family

## 2023-09-20 ENCOUNTER — Ambulatory Visit: Payer: Medicare Other | Admitting: Family

## 2023-09-20 VITALS — BP 139/70 | HR 71 | Temp 98.4°F | Resp 16 | Ht 74.0 in | Wt 235.0 lb

## 2023-09-20 DIAGNOSIS — Z23 Encounter for immunization: Secondary | ICD-10-CM | POA: Diagnosis not present

## 2023-09-20 DIAGNOSIS — Z7184 Encounter for health counseling related to travel: Secondary | ICD-10-CM

## 2023-09-20 MED ORDER — TETANUS-DIPHTHERIA TOXOIDS TD 2-2 LF/0.5ML IM SUSP
0.5000 mL | Freq: Once | INTRAMUSCULAR | 0 refills | Status: AC
Start: 2023-09-20 — End: 2023-09-20

## 2023-09-20 MED ORDER — TYPHOID VACCINE PO CPDR: DELAYED_RELEASE_CAPSULE | ORAL | 0 refills | Status: DC

## 2023-09-20 MED ORDER — HAVRIX 720 EL U/0.5ML IM SUSP: INTRAMUSCULAR | 1 refills | Status: DC

## 2023-09-20 MED ORDER — SHINGRIX 50 MCG/0.5ML IM SUSR: 0.5000 mL | Freq: Once | INTRAMUSCULAR | 0 refills | Status: AC

## 2023-09-20 NOTE — Assessment & Plan Note (Signed)
  Upcoming travel to [place], [place], and [place]. Discussed the need for various vaccinations and precautions against mosquito-borne illnesses. -Administer influenza vaccine today in the office. -Send prescriptions for Shingrix , Tetanus, and Hepatitis A vaccines to be administered at the pharmacy. -Send prescription for Typhoid pills, to be started two weeks prior to travel. -Consider Chikungunya vaccine, available at travel medicine clinics or health department. -Advise patient to use mosquito repellent and wear long pants/sleeves in mosquito-prone areas. -Recommend wearing compression stockings during long flights to prevent clots.

## 2023-09-20 NOTE — Progress Notes (Signed)
 Subjective:     Patient ID: Mario Carpenter, male    DOB: [birth year], 68 y.o.   MRN: [id number]  Chief Complaint  Patient presents with   Immunizations    Patient will like to get needed immunizations for traveling    HPI  Discussed the use of AI scribe software for clinical note transcription with the patient, who gave verbal consent to proceed.  History of Present Illness   Mario Carpenter presents for a pre-travel consultation. He is planning a two-week trip to [place], [place], and [place] in February which is primarily for leisure. The patient will be staying in hotels and does not anticipate spending time in remote areas. He mentions a lump in his hand that has been previously evaluated by a hand specialist and is currently inflamed. The lump does not cause pain but is described as annoying. He is considering surgery for this.          Health Maintenance Due  Topic Date Due   COVID-19 Vaccine (3 - Pfizer risk series) 01/06/2020   Pneumonia Vaccine 12+ Years old (2 of 2 - PCV) 02/08/2022   Zoster Vaccines- Shingrix  (2 of 2) 09/05/2022   Lung Cancer Screening  10/11/2023    Past Medical History:  Diagnosis Date   Alcoholism (HCC)    Resolved   Arthritis    Foot pain, right    History of bronchitis    History of chicken pox    Hypertension    Melanoma in situ (HCC) 2021   left shoulder (Dr. Ennis Hart. Denman Fischer).   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   Procedure: LEFT TOTAL HIP ARTHROPLASTY ANTERIOR APPROACH;  Surgeon: Liliane Rei, MD;  Location: [facility];  Service: Orthopedics;  Laterality: Left;   WISDOM TOOTH EXTRACTION      Family History  Problem Relation Age of Onset   Heart disease Mother 68       Deceased   Heart defect Mother        hx of rheumatic fever, valve  replacement   Diabetes Son        type 1   Diabetes Father 34       Deceased   Healthy Brother        x1   Heart attack Sister        66, stent in LAD   Hypertension Sister    Diabetes Sister     Social History   Socioeconomic History   Marital status: Married    Spouse name: Not on file   Number of children: Not on file   Years of education: Not on file   Highest education level: Not on file  Occupational History   Not on file  Tobacco Use   Smoking status: Former    Current packs/day: 2.00    Average packs/day: 2.0 packs/day for 25.0 years (50.0 ttl pk-yrs)    Types: Cigarettes    Passive exposure: Current   Smokeless tobacco: Never  Vaping Use   Vaping status: Never Used  Substance and Sexual Activity   Alcohol use: No    Comment: hx of alcoholism; quit 15 years ago    Drug use: No    Comment: cocaine,marijuana use in past quit  20 years ago    Sexual activity: Not on file  Other Topics Concern   Not on file  Social History Narrative   Works at [employer]   2 sons (one in [place] and one in [place])   2 stepdaughters   Enjoys golf   Married (second marriage)      Social Drivers of Corporate investment banker Strain: Low Risk  (02/28/2022)   Overall Financial Resource Strain (CARDIA)    Difficulty of Paying Living Expenses: Not hard at all  Food Insecurity: No Food Insecurity (03/03/2023)   Hunger Vital Sign    Worried About Running Out of Food in the Last Year: Never true    Ran Out of Food in the Last Year: Never true  Transportation Needs: No Transportation Needs (03/03/2023)   PRAPARE - Administrator, Civil Service (Medical): No    Lack of Transportation (Non-Medical): No  Physical Activity: Sufficiently Active (03/03/2023)   Exercise Vital Sign    Days of Exercise per Week: 2 days    Minutes of Exercise per Session: 100 min  Stress: No Stress Concern Present (02/28/2022)   [employer] of Occupational Health - Occupational  Stress Questionnaire    Feeling of Stress : Not at all  Social Connections: Moderately Integrated (02/28/2022)   Social Connection and Isolation Panel [NHANES]    Frequency of Communication with Friends and Family: More than three times a week    Frequency of Social Gatherings with Friends and Family: More than three times a week    Attends Religious Services: 1 to 4 times per year    Active Member of [employer] or Organizations: No    Attends Banker Meetings: Never    Marital Status: Married  Catering manager Violence: Not At Risk (03/03/2023)   Humiliation, Afraid, Rape, and Kick questionnaire    Fear of Current or Ex-Partner: No    Emotionally Abused: No    Physically Abused: No    Sexually Abused: No    Outpatient Medications Prior to Visit  Medication Sig Dispense Refill   amLODipine  (NORVASC ) 5 MG tablet TAKE ONE AND ONE-HALF TABLETS BY MOUTH DAILY 135 tablet 0   aspirin  81 MG tablet Take 81 mg by mouth daily.     finasteride  (PROSCAR ) 5 MG tablet Take 1 tablet (5 mg total) by mouth daily. 90 tablet 0   meloxicam  (MOBIC ) 15 MG tablet TAKE ONE TABLET BY MOUTH ONCE A DAY 30 tablet 0   Multiple Vitamins-Minerals (MULTIVITAMIN WITH MINERALS) tablet Take 1 tablet by mouth daily.     sildenafil  (VIAGRA ) 100 MG tablet Take 100 mg by mouth daily as needed for erectile dysfunction.     tadalafil  (CIALIS ) 5 MG tablet Take 1 tablet (5 mg total) by mouth daily at 6 (six) AM. 30 tablet 11   tamsulosin  (FLOMAX ) 0.4 MG CAPS capsule TAKE ONE CAPSULE BY MOUTH ONCE A DAY 90 capsule 0   No facility-administered medications prior to visit.    No Known Allergies  ROS See HPI    Objective:    Physical Exam Constitutional:      Appearance: Normal appearance.  Cardiovascular:     Rate and Rhythm: Normal rate.  Pulmonary:     Effort: Pulmonary effort is normal.  Musculoskeletal:     Comments: Left wrist in brace  Neurological:     Mental Status: He is alert.      BP 139/70    Pulse 71  Temp 98.4 F (36.9 C) (Oral)   Resp 16   Ht 6\' 2"  (1.88 m)   Wt 235 lb (106.6 kg)   SpO2 99%   BMI 30.17 kg/m  Wt Readings from Last 3 Encounters:  09/20/23 235 lb (106.6 kg)  05/24/23 233 lb 12.8 oz (106.1 kg)  03/03/23 233 lb (105.7 kg)       Assessment & Plan:   Problem List Items Addressed This Visit       Unprioritized   Travel advice encounter    Upcoming travel to [place], [place], and [place]. Discussed the need for various vaccinations and precautions against mosquito-borne illnesses. -Administer influenza vaccine today in the office. -Send prescriptions for Shingrix , Tetanus, and Hepatitis A vaccines to be administered at the pharmacy. -Send prescription for Typhoid pills, to be started two weeks prior to travel. -Consider Chikungunya vaccine, available at travel medicine clinics or health department. -Advise patient to use mosquito repellent and wear long pants/sleeves in mosquito-prone areas. -Recommend wearing compression stockings during long flights to prevent clots.      Other Visit Diagnoses       Need for influenza vaccination    -  Primary   Relevant Orders   Flu Vaccine Trivalent High Dose (Fluad) (Completed)       I am having Mario Carpenter start on diptheria-tetanus toxoids, typhoid, Havrix , and Shingrix . I am also having him maintain his aspirin , tadalafil , multivitamin with minerals, sildenafil , finasteride , tamsulosin , meloxicam , and amLODipine .  Meds ordered this encounter  Medications   diptheria-tetanus toxoids (DECAVAC) 2-2 LF/0.5ML injection    Sig: Inject 0.5 mLs into the muscle once for 1 dose.    Dispense:  0.5 mL    Refill:  0    Supervising Provider:   Randie Bustle A [[id number]]   typhoid (VIVOTIF) DR capsule    Sig: (day 1, 3, 5, and 7) for a total of 4 doses; all doses should be complete at least 1 week prior to potential exposure.    Dispense:  4 capsule    Refill:  0    Supervising Provider:   Randie Bustle A [[id number]]   hepatitis A vaccine pediatric (HAVRIX ) 720 EL U/0.5ML SUSP injection    Sig: Repeat in 6-12 months    Dispense:  0.5 mL    Refill:  1    Supervising Provider:   Randie Bustle A [[id number]]   Zoster Vaccine Adjuvanted (SHINGRIX ) injection    Sig: Inject 0.5 mLs into the muscle once for 1 dose.    Dispense:  0.5 mL    Refill:  0    Supervising Provider:   Randie Bustle A [[id number]]

## 2023-09-20 NOTE — Patient Instructions (Signed)
 VISIT SUMMARY:  Mario Carpenter visited today for a pre-travel consultation for his upcoming trip to [place], [place], and [place]. We discussed necessary vaccinations and precautions to ensure a safe and healthy journey. Additionally, he mentioned a previously evaluated lump in his hand, which is currently inflamed but not painful.  YOUR PLAN:  -TRAVEL MEDICINE: Travel medicine involves preparing for health and safety during travel. For your upcoming trip, you received the influenza vaccine today. Prescriptions for Shingrix , Tetanus, and Hepatitis A vaccines have been sent to your pharmacy. Please also get your covid booster at your pharmacy. You will also start Typhoid pills two weeks before your travel. Consider getting the Chikungunya vaccine from a travel clinic or health department. Use mosquito repellent and wear long pants in mosquito-prone areas. Wear compression stockings during long flights to prevent blood clots.    INSTRUCTIONS:  Please follow up with your pharmacy to receive the Shingrix , Tetanus, and Hepatitis A vaccines. Start taking the Typhoid pills two weeks before your trip. Consider visiting a travel clinic or health department for the Chikungunya vaccine. Use mosquito repellent and wear long pants in mosquito-prone areas during your trip. Wear compression stockings during long flights to prevent blood clots.

## 2023-09-28 ENCOUNTER — Other Ambulatory Visit: Payer: Self-pay | Admitting: Family

## 2023-09-28 ENCOUNTER — Encounter: Payer: Self-pay | Admitting: Family

## 2023-09-29 MED ORDER — CIPROFLOXACIN HCL 500 MG PO TABS: 500.0000 mg | ORAL_TABLET | Freq: Two times a day (BID) | ORAL | 0 refills | Status: AC

## 2023-10-01 MED ORDER — SILDENAFIL CITRATE 100 MG PO TABS
100.0000 mg | ORAL_TABLET | Freq: Every day | ORAL | 5 refills | Status: AC | PRN
  Filled 2023-10-01: qty 10, 10d supply, fill #0

## 2023-10-01 NOTE — Addendum Note (Signed)
Addended by: Sandford Craze on: 10/01/2023 10:47 AM   Modules accepted: Orders

## 2023-10-02 ENCOUNTER — Other Ambulatory Visit (HOSPITAL_BASED_OUTPATIENT_CLINIC_OR_DEPARTMENT_OTHER): Payer: Self-pay

## 2023-10-02 ENCOUNTER — Other Ambulatory Visit: Payer: Self-pay | Admitting: Family

## 2023-10-28 ENCOUNTER — Other Ambulatory Visit: Payer: Self-pay | Admitting: Family

## 2023-11-02 ENCOUNTER — Other Ambulatory Visit: Payer: Self-pay | Admitting: Family

## 2023-12-29 ENCOUNTER — Other Ambulatory Visit: Payer: Self-pay | Admitting: Family

## 2024-01-11 ENCOUNTER — Telehealth: Payer: Self-pay | Admitting: Family

## 2024-01-11 NOTE — Telephone Encounter (Signed)
 Copied from CRM [id number]. Topic: Medicare AWV >> Jan 11, 2024 10:30 AM Juliana Ocean wrote: Reason for CRM: LVM 01/11/2024 to schedule AWV. Please schedule Virtual or Telehealth visits ONLY.   Mario Carpenter; Care Guide Ambulatory Clinical Support [facility] Direct Dial: [phone]

## 2024-01-25 ENCOUNTER — Ambulatory Visit: Payer: Self-pay

## 2024-01-25 NOTE — Telephone Encounter (Signed)
  Chief Complaint: left shoulder injury Symptoms: moderate left shoulder pain with movement, intermittent mild tingling in left arm/shoulder, left elbow scrapes Frequency: x 1 week Pertinent Negatives: Patient denies bruising, swelling, bleeding. Disposition: [] ED /[] Urgent Care (no appt availability in office) / [x] Appointment(In office/virtual)/ []  [facility]/ [] Home Care/ [] Refused Recommended Disposition /[] [facility]/ []  Follow-up with PCP Additional Notes: Patient states he went to urgent care after the fall and they did X rays and told him it isn't broken. He states he is concerned that he may have torn his rotator cuff. Patient states he can raise his left arm but only to about a 90 degree angle. Patient agreeable to acute visit tomorrow with available provider.  Copied from CRM [id number]. Topic: Clinical - Red Word Triage >> Jan 25, 2024  2:49 PM Rachelle R wrote: Red Word that prompted transfer to Nurse Triage: Patient had a fall a week ago, tripped over a curb and landed on his elbow and pushed into his left shoulder. Not able to lift arm more than parallel and still has pain. Reason for Disposition  Can't move injured shoulder normally (e.g., full range of motion, able to touch top of head)  Answer Assessment - Initial Assessment Questions 1. MECHANISM: "How did the injury happen?"     Patient states he was stepping up to the curb and he fell, landed on his left elbow. It took the brunt force of the fall. He states his elbow is fine in terms of function but his left shoulder feels jammed up since the fall.  2. ONSET: "When did the injury happen?" (Minutes or hours ago)      1 week ago 01/18/24.  3. APPEARANCE of INJURY: "What does the injury look like?"      Shoulder looks normal.  4. SEVERITY: "Can you move the shoulder normally?"      He can lift his left arm to a 90 degree angle but can not full raise it above his head.  5. SIZE: For cuts, bruises, or  swelling, ask: "How large is it?" (e.g., inches or centimeters;  entire joint)      Left elbow cuts/scrapes 1 inch wide and about 2 inches long (treating with neosporin and bandages)  6. PAIN: "Is there pain?" If Yes, ask: "How bad is the pain?"   (e.g., Scale 1-10; or mild, moderate, severe)   - MILD (1-3): doesn't interfere with normal activities   - MODERATE (4-7): interferes with normal activities (e.g., work or school) or awakens from sleep   - SEVERE (8-10): excruciating pain, unable to do any normal activities, unable to move arm at all due to pain     No pain at rest, with movement its 5-6/10.  7. TETANUS: For any breaks in the skin, ask: "When was the last tetanus booster?"     January 2025.  8. OTHER SYMPTOMS: "Do you have any other symptoms?" (e.g., loss of sensation)     Little tingling in left arm.  9. PREGNANCY: "Is there any chance you are pregnant?" "When was your last menstrual period?"     N/A.  Protocols used: Shoulder Injury-A-AH

## 2024-01-26 ENCOUNTER — Encounter: Payer: Self-pay | Admitting: Family Medicine

## 2024-01-26 ENCOUNTER — Ambulatory Visit (INDEPENDENT_AMBULATORY_CARE_PROVIDER_SITE_OTHER): Admitting: Family Medicine

## 2024-01-26 VITALS — BP 132/74 | HR 74 | Temp 98.0°F | Resp 16 | Ht 74.0 in | Wt 230.0 lb

## 2024-01-26 DIAGNOSIS — M25512 Pain in left shoulder: Secondary | ICD-10-CM

## 2024-01-26 NOTE — Patient Instructions (Signed)
 If you do not hear anything about your referral in the next few business days, call our office and ask for an update.  Heat (pad or rice pillow in microwave) over affected area, 10-15 minutes twice daily.   Ice/cold pack over area for 10-15 min twice daily.  OK to take Tylenol  1000 mg (2 extra strength tabs) or 975 mg (3 regular strength tabs) every 6 hours as needed.  Let us  know if you need anything.

## 2024-01-26 NOTE — Progress Notes (Signed)
 Musculoskeletal Exam  Patient: Mario Carpenter DOB: 1955-10-19  DOS: 01/26/2024  SUBJECTIVE:  Chief Complaint:   Chief Complaint  Patient presents with   Shoulder Injury    Left Shoulder Pain    Mario Carpenter is a 68 y.o.  male for evaluation and treatment of L shoulder pain.   Onset:  1 week ago. Had a fall tripping over the curb and landed on his L elbow.  Location: top of L shoulder Character:  sharp  Progression of issue:  is unchanged Associated symptoms: pain limits ROM, waking him up at night.  No bruising, redness, swelling.  Treatment: to date has been acetaminophen .   Neurovascular symptoms: no  Past Medical History:  Diagnosis Date   Alcoholism (HCC)    Resolved   Arthritis    Foot pain, right    History of bronchitis    History of chicken pox    Hypertension    Melanoma in situ [facility]) 2021   left shoulder (Dr. Ennis Hart. Denman Fischer).   Nocturia    Psoriasis    Urinary frequency     Objective: VITAL SIGNS: BP 132/74 (BP Location: Left Arm, Patient Position: Sitting)   Pulse 74   Temp 98 F (36.7 C) (Oral)   Resp 16   Ht 6\' 2"  (1.88 m)   Wt 230 lb (104.3 kg)   SpO2 98%   BMI 29.53 kg/m  Constitutional: Well formed, well developed. No acute distress. Thorax & Lungs: No accessory muscle use Musculoskeletal: L shoulder.   Normal active range of motion: no.   Normal passive range of motion: no Tenderness to palpation: no Deformity: no Ecchymosis: no Tests positive: Drop arm, Hawkins Tests negative:  unable to perform empty can; speed's, lift off, cross over, obrien's, neer's Neurologic: Normal sensory function.  Psychiatric: Normal mood. Age appropriate judgment and insight. Alert & oriented x 3.    Assessment:  Acute pain of left shoulder - Plan: Ambulatory referral to Orthopedic Surgery  Plan: Suspect supraspinatus tear. Refer ortho. Hold off on imaging should they want a specific order. Stretch/move shoulder 2x/day, heat,  ice, Tylenol .  F/u w reg NP as originally scheduled. The patient voiced understanding and agreement to the plan.   Shellie Dials Donalds, DO 01/26/24  3:56 PM

## 2024-01-30 ENCOUNTER — Other Ambulatory Visit: Payer: Self-pay | Admitting: Family

## 2024-01-31 ENCOUNTER — Encounter: Payer: Self-pay | Admitting: Family Medicine

## 2024-03-20 ENCOUNTER — Ambulatory Visit (INDEPENDENT_AMBULATORY_CARE_PROVIDER_SITE_OTHER)

## 2024-03-20 VITALS — BP 132/74 | Ht 74.0 in | Wt 225.0 lb

## 2024-03-20 DIAGNOSIS — Z2821 Immunization not carried out because of patient refusal: Secondary | ICD-10-CM

## 2024-03-20 DIAGNOSIS — Z0000 Encounter for general adult medical examination without abnormal findings: Secondary | ICD-10-CM

## 2024-03-20 NOTE — Progress Notes (Signed)
 Because this visit was a virtual/telehealth visit,  certain criteria was not obtained, such a blood pressure, CBG if applicable, and timed get up and go. Any medications not marked as taking were not mentioned during the medication reconciliation part of the visit. Any vitals not documented were not able to be obtained due to this being a telehealth visit or patient was unable to self-report a recent blood pressure reading due to a lack of equipment at home via telehealth. Vitals that have been documented are verbally provided by the patient.  This visit was performed by a medical professional under my direct supervision. I was immediately available for consultation/collaboration. I have reviewed and agree with the Annual Wellness Visit documentation.  Subjective:   Mario Carpenter is a 68 y.o. who presents for a Medicare Wellness preventive visit.  As a reminder, Annual Wellness Visits don't include a physical exam, and some assessments may be limited, especially if this visit is performed virtually. We may recommend an in-person follow-up visit with your provider if needed.  Visit Complete: Virtual I connected with  Mario Carpenter on 03/20/24 by a audio enabled telemedicine application and verified that I am speaking with the correct person using two identifiers.  Patient Location: Home  Provider Location: Home Office  I discussed the limitations of evaluation and management by telemedicine. The patient expressed understanding and agreed to proceed.  Vital Signs: Because this visit was a virtual/telehealth visit, some criteria may be missing or patient reported. Any vitals not documented were not able to be obtained and vitals that have been documented are patient reported.  VideoDeclined- This patient declined Librarian, academic. Therefore the visit was completed with audio only.  Persons Participating in Visit: Patient.  AWV Questionnaire: No:  Patient Medicare AWV questionnaire was not completed prior to this visit.  Cardiac Risk Factors include: advanced age (>86men, >75 women);hypertension;male gender     Objective:    Today's Vitals   03/20/24 0811  BP: 132/74  Weight: 225 lb (102.1 kg)  Height: 6' 2 (1.88 m)   Body mass index is 28.89 kg/m.     03/20/2024    8:15 AM 03/03/2023    8:22 AM 04/11/2022    2:07 PM 02/28/2022    8:34 AM 01/13/2017    7:35 AM 08/12/2016    8:30 PM 08/12/2016   12:56 PM  Advanced Directives  Does Patient Have a Medical Advance Directive? Yes Yes Yes Yes Yes  Yes  Yes   Type of Estate agent of [place];Living will Healthcare Power of [place];Living will Living will Healthcare Power of [place];Living will Healthcare Power of [place];Living will Healthcare Power of [employer] Power of Attorney  Does patient want to make changes to medical advance directive? No - Patient declined No - Patient declined No - Patient declined   No - Patient declined    Copy of Healthcare Power of Attorney in Chart? Yes - validated most recent copy scanned in chart (See row information) Yes - validated most recent copy scanned in chart (See row information)  Yes - validated most recent copy scanned in chart (See row information) No - copy requested  Yes  Yes   Would patient like information on creating a medical advance directive?      No - Patient declined       Data saved with a previous flowsheet row definition    Current Medications (verified) Outpatient Encounter Medications as of 03/20/2024  Medication Sig  amLODipine  (NORVASC ) 5 MG tablet TAKE ONE AND ONE-HALF TABLETS BY MOUTH DAILY   aspirin  81 MG tablet Take 81 mg by mouth daily.   finasteride  (PROSCAR ) 5 MG tablet TAKE ONE TABLET BY MOUTH ONCE A DAY   hepatitis A vaccine pediatric (HAVRIX ) 720 EL U/0.5ML SUSP injection Repeat in 6-12 months   meloxicam  (MOBIC ) 15 MG tablet TAKE ONE TABLET BY MOUTH ONCE A DAY   Multiple  Vitamins-Minerals (MULTIVITAMIN WITH MINERALS) tablet Take 1 tablet by mouth daily.   sildenafil  (VIAGRA ) 100 MG tablet Take 1 tablet (100 mg total) by mouth daily as needed for erectile dysfunction.   tadalafil  (CIALIS ) 5 MG tablet Take 1 tablet (5 mg total) by mouth daily at 6 (six) AM.   tamsulosin  (FLOMAX ) 0.4 MG CAPS capsule TAKE ONE CAPSULE BY MOUTH ONCE A DAY   typhoid (VIVOTIF) DR capsule (day 1, 3, 5, and 7) for a total of 4 doses; all doses should be complete at least 1 week prior to potential exposure.   No facility-administered encounter medications on file as of 03/20/2024.    Allergies (verified) Patient has no known allergies.   History: Past Medical History:  Diagnosis Date   Alcoholism (HCC)    Resolved   Arthritis    Foot pain, right    History of bronchitis    History of chicken pox    Hypertension    Melanoma in situ [facility]) 2021   left shoulder (Dr. Otho. Norleen Hurst).   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   Procedure: LEFT TOTAL HIP ARTHROPLASTY ANTERIOR APPROACH;  Surgeon: Dempsey Moan, MD;  Location: [facility];  Service: Orthopedics;  Laterality: Left;   WISDOM TOOTH EXTRACTION     Family History  Problem Relation Age of Onset   Heart disease Mother 104       Deceased   Heart defect Mother        hx of rheumatic fever, valve replacement   Diabetes Son        type 1   Diabetes Father 71       Deceased   Healthy Brother        x1   Heart attack Sister        3, stent in LAD   Hypertension Sister    Diabetes Sister    Social History   Socioeconomic History   Marital status: Married    Spouse name: Not on file   Number of children: Not on file   Years of education: Not on file   Highest education level: Not on file  Occupational History   Not on file  Tobacco Use    Smoking status: Former    Current packs/day: 2.00    Average packs/day: 2.0 packs/day for 25.0 years (50.0 ttl pk-yrs)    Types: Cigarettes    Passive exposure: Current   Smokeless tobacco: Never  Vaping Use   Vaping status: Never Used  Substance and Sexual Activity   Alcohol use: No    Comment: hx of alcoholism; quit 15 years ago    Drug use: No    Comment: cocaine,marijuana use in past quit 20 years ago    Sexual activity: Not on file  Other Topics Concern   Not  on file  Social History Narrative   Works at [employer]   2 sons (one in [place] and one in [place])   2 stepdaughters   Enjoys golf   Married (second marriage)      Social Drivers of Corporate investment banker Strain: Low Risk  (03/20/2024)   Overall Financial Resource Strain (CARDIA)    Difficulty of Paying Living Expenses: Not hard at all  Food Insecurity: No Food Insecurity (03/20/2024)   Hunger Vital Sign    Worried About Running Out of Food in the Last Year: Never true    Ran Out of Food in the Last Year: Never true  Transportation Needs: No Transportation Needs (03/20/2024)   PRAPARE - Administrator, Civil Service (Medical): No    Lack of Transportation (Non-Medical): No  Physical Activity: Sufficiently Active (03/20/2024)   Exercise Vital Sign    Days of Exercise per Week: 4 days    Minutes of Exercise per Session: 100 min  Stress: No Stress Concern Present (03/20/2024)   [employer] of Occupational Health - Occupational Stress Questionnaire    Feeling of Stress: Not at all  Social Connections: Moderately Integrated (03/20/2024)   Social Connection and Isolation Panel    Frequency of Communication with Friends and Family: More than three times a week    Frequency of Social Gatherings with Friends and Family: More than three times a week    Attends Religious Services: Never    Database administrator or Organizations: No    Attends Engineer, structural: More than 4  times per year    Marital Status: Married    Tobacco Counseling Counseling given: Not Answered    Clinical Intake:  Pre-visit preparation completed: Yes  Pain : No/denies pain     BMI - recorded: 28.89 Nutritional Status: BMI 25 -29 Overweight Nutritional Risks: None Diabetes: No  Lab Results  Component Value Date   HGBA1C 5.8 01/20/2017   HGBA1C 6.0 09/23/2014   HGBA1C 5.8 (H) 09/12/2012     How often do you need to have someone help you when you read instructions, pamphlets, or other written materials from your doctor or pharmacy?: 1 - Never  Interpreter Needed?: No  Information entered by :: Zyad Boomer,CMA   Activities of Daily Living     03/20/2024    8:14 AM  In your present state of health, do you have any difficulty performing the following activities:  Hearing? 0  Vision? 0  Difficulty concentrating or making decisions? 0  Walking or climbing stairs? 0  Dressing or bathing? 0  Doing errands, shopping? 0  Preparing Food and eating ? N  Using the Toilet? N  In the past six months, have you accidently leaked urine? Y  Do you have problems with loss of bowel control? N  Managing your Medications? N  Managing your Finances? N  Housekeeping or managing your Housekeeping? N    Patient Care Team: Daryl Setter, NP as PCP - General (Internal Medicine) Daryl Setter, NP (Internal Medicine)  I have updated your Care Teams any recent Medical Services you may have received from other providers in the past year.     Assessment:   This is a routine wellness examination for [place].  Hearing/Vision screen Hearing Screening - Comments:: No difficulties Vision Screening - Comments:: No difficulties seeing   Goals Addressed             This Visit's Progress    Patient  Stated       To retire       Depression Screen     03/20/2024    8:16 AM 03/03/2023    8:28 AM 02/28/2022    8:37 AM 02/08/2021    9:05 AM 11/30/2020    9:27  AM 10/25/2017    9:30 AM 07/22/2016    9:44 AM  PHQ 2/9 Scores  PHQ - 2 Score 0 0 0 0 0 0 0  PHQ- 9 Score 0     3     Fall Risk     03/20/2024    8:15 AM 03/03/2023    8:23 AM 02/28/2022    8:35 AM 02/08/2021    9:05 AM 07/22/2016    9:44 AM  Fall Risk   Falls in the past year? 1 0 0 0 No   Number falls in past yr: 1 0 0 0   Injury with Fall? 1 0 0 0   Risk for fall due to : History of fall(s) No Fall Risks     Follow up Falls evaluation completed;Education provided Falls evaluation completed Falls prevention discussed        Data saved with a previous flowsheet row definition    MEDICARE RISK AT HOME:  Medicare Risk at Home Any stairs in or around the home?: Yes If so, are there any without handrails?: No Home free of loose throw rugs in walkways, pet beds, electrical cords, etc?: Yes Adequate lighting in your home to reduce risk of falls?: Yes Life alert?: No Use of a cane, walker or w/c?: No Grab bars in the bathroom?: No Shower chair or bench in shower?: No Elevated toilet seat or a handicapped toilet?: Yes  TIMED UP AND GO:  Was the test performed?  No  Cognitive Function: 6CIT completed        03/20/2024    8:13 AM 03/03/2023    8:29 AM  6CIT Screen  What Year? 0 points 0 points  What month? 0 points 0 points  What time? 0 points 0 points  Count back from 20 0 points 0 points  Months in reverse 0 points 0 points  Repeat phrase 0 points 2 points  Total Score 0 points 2 points    Immunizations Immunization History  Administered Date(s) Administered   Fluad Quad(high Dose 65+) 09/26/2022   Fluad Trivalent(High Dose 65+) 09/20/2023   Influenza, Seasonal, Injecte, Preservative Fre 09/12/2012   Influenza,inj,Quad PF,6+ Mos 07/26/2015, 07/22/2016, 07/09/2018, 07/03/2019   PFIZER(Purple Top)SARS-COV-2 Vaccination 11/16/2019, 12/09/2019   Pneumococcal Polysaccharide-23 02/08/2021   Tdap 09/20/2013   Zoster Recombinant(Shingrix ) 07/11/2022    Screening  Tests Health Maintenance  Topic Date Due   COVID-19 Vaccine (3 - Pfizer risk series) 01/06/2020   Pneumococcal Vaccine: 50+ Years (2 of 2 - PCV) 02/08/2022   Zoster Vaccines- Shingrix  (2 of 2) 09/05/2022   DTaP/Tdap/Td (2 - Td or Tdap) 09/21/2023   Lung Cancer Screening  10/11/2023   INFLUENZA VACCINE  04/05/2024   Medicare Annual Wellness (AWV)  03/20/2025   Colonoscopy  01/14/2027   Hepatitis B Vaccines  Aged Out   HPV VACCINES  Aged Out   Meningococcal B Vaccine  Aged Out   Hepatitis C Screening  Discontinued    Health Maintenance  Health Maintenance Due  Topic Date Due   COVID-19 Vaccine (3 - Pfizer risk series) 01/06/2020   Pneumococcal Vaccine: 50+ Years (2 of 2 - PCV) 02/08/2022   Zoster Vaccines- Shingrix  (2 of 2) 09/05/2022  DTaP/Tdap/Td (2 - Td or Tdap) 09/21/2023   Lung Cancer Screening  10/11/2023   Health Maintenance Items Addressed:patient declined some vaccinations and said he will call [facility] to get the records for the ones he has had  Additional Screening:  Vision Screening: Recommended annual ophthalmology exams for early detection of glaucoma and other disorders of the eye. Would you like a referral to an eye doctor? No    Dental Screening: Recommended annual dental exams for proper oral hygiene  Community Resource Referral / Chronic Care Management: CRR required this visit?  No   CCM required this visit?  No   Plan:    I have personally reviewed and noted the following in the patient's chart:   Medical and social history Use of alcohol, tobacco or illicit drugs  Current medications and supplements including opioid prescriptions. Patient is not currently taking opioid prescriptions. Functional ability and status Nutritional status Physical activity Advanced directives List of other physicians Hospitalizations, surgeries, and ER visits in previous 12 months Vitals Screenings to include cognitive, depression, and falls Referrals and  appointments  In addition, I have reviewed and discussed with patient certain preventive protocols, quality metrics, and best practice recommendations. A written personalized care plan for preventive services as well as general preventive health recommendations were provided to patient.   Lyle MARLA Right, [place]   03/20/2024   After Visit Summary: (MyChart) Due to this being a telephonic visit, the after visit summary with patients personalized plan was offered to patient via MyChart   Notes: Nothing significant to report at this time.

## 2024-03-20 NOTE — Patient Instructions (Signed)
 Mario Carpenter , Thank you for taking time out of your busy schedule to complete your Annual Wellness Visit with me. I enjoyed our conversation and look forward to speaking with you again next year. I, as well as your care team,  appreciate your ongoing commitment to your health goals. Please review the following plan we discussed and let me know if I can assist you in the future. Your Game plan/ To Do List    Referrals: If you haven't heard from the office you've been referred to, please reach out to them at the phone provided.  None sent Follow up Visits: Next Medicare AWV with our clinical staff: 03/26/2025 8:00am   Have you seen your provider in the last 6 months (3 months if uncontrolled diabetes)? Yes Next Office Visit with your provider: none  Clinician Recommendations:  Aim for 30 minutes of exercise or brisk walking, 6-8 glasses of water, and 5 servings of fruits and vegetables each day.       This is a list of the screening recommended for you and due dates:  Health Maintenance  Topic Date Due   COVID-19 Vaccine (3 - Pfizer risk series) 01/06/2020   Pneumococcal Vaccine for age over 66 (2 of 2 - PCV) 02/08/2022   Zoster (Shingles) Vaccine (2 of 2) 09/05/2022   DTaP/Tdap/Td vaccine (2 - Td or Tdap) 09/21/2023   Screening for Lung Cancer  10/11/2023   Flu Shot  04/05/2024   Medicare Annual Wellness Visit  03/20/2025   Colon Cancer Screening  01/14/2027   Hepatitis B Vaccine  Aged Out   HPV Vaccine  Aged Out   Meningitis B Vaccine  Aged Out   Hepatitis C Screening  Discontinued    Advanced directives: (In Chart) A copy of your advanced directives are scanned into your chart should your provider ever need it. Advance Care Planning is important because it:  [x]  Makes sure you receive the medical care that is consistent with your values, goals, and preferences  [x]  It provides guidance to your family and loved ones and reduces their decisional burden about whether or not they  are making the right decisions based on your wishes.  Follow the link provided in your after visit summary or read over the paperwork we have mailed to you to help you started getting your Advance Directives in place. If you need assistance in completing these, please reach out to us  so that we can help you!  See attachments for Preventive Care and Fall Prevention Tips.

## 2024-04-23 ENCOUNTER — Ambulatory Visit (INDEPENDENT_AMBULATORY_CARE_PROVIDER_SITE_OTHER): Admitting: Family

## 2024-04-23 ENCOUNTER — Encounter: Payer: Self-pay | Admitting: Family

## 2024-04-23 VITALS — BP 128/67 | HR 77 | Temp 98.7°F | Resp 16 | Ht 74.0 in | Wt 228.0 lb

## 2024-04-23 DIAGNOSIS — M62561 Muscle wasting and atrophy, not elsewhere classified, right lower leg: Secondary | ICD-10-CM | POA: Diagnosis not present

## 2024-04-23 DIAGNOSIS — H9319 Tinnitus, unspecified ear: Secondary | ICD-10-CM | POA: Diagnosis not present

## 2024-04-23 DIAGNOSIS — G8929 Other chronic pain: Secondary | ICD-10-CM | POA: Diagnosis not present

## 2024-04-23 DIAGNOSIS — M5441 Lumbago with sciatica, right side: Secondary | ICD-10-CM | POA: Diagnosis not present

## 2024-04-23 NOTE — Progress Notes (Unsigned)
 *  Subjective:     Patient ID: Mario Carpenter, male    DOB: October 24, 1955, 68 y.o.   MRN: [id number]  Chief Complaint  Patient presents with   Tinnitus    Complains of left ear ringing on and off    HPI  Discussed the use of AI scribe software for clinical note transcription with the patient, who gave verbal consent to proceed.  History of Present Illness  Mario Carpenter is a 68 year old male who presents with left ear ringing and right leg atrophy.  He experiences a sensation in his left ear, described as a vibration, which began approximately one month ago in [place]. This sensation, distinct from his usual ear ringing, has almost dissipated.  He notes progressive atrophy of his right leg over the past couple of years, describing it as getting smaller with difficulty balancing. He attributes balance issues to his foot. He has persistent right ankle pain and recalls that his legs were once equal in size. He reports no significant weakness but an inability to balance on the right leg.  He experiences low back pain on the same side as the leg atrophy. He has not had recent imaging of his lower back but recalls significant back pain years ago, which improved with yoga.     Health Maintenance Due  Topic Date Due   COVID-19 Vaccine (3 - Pfizer risk series) 01/06/2020   Pneumococcal Vaccine: 50+ Years (2 of 2 - PCV) 02/08/2022   Zoster Vaccines- Shingrix  (2 of 2) 09/05/2022   DTaP/Tdap/Td (2 - Td or Tdap) 09/21/2023   Lung Cancer Screening  10/11/2023   INFLUENZA VACCINE  04/05/2024    Past Medical History:  Diagnosis Date   Alcoholism (HCC)    Resolved   Arthritis    Foot pain, right    History of bronchitis    History of chicken pox    Hypertension    Melanoma in situ (HCC) 2021   left shoulder (Dr. Otho. Norleen Hurst).   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   Procedure: LEFT TOTAL HIP ARTHROPLASTY ANTERIOR APPROACH;  Surgeon: Dempsey Moan, MD;  Location: [facility];  Service: Orthopedics;  Laterality: Left;   WISDOM TOOTH EXTRACTION      Family History  Problem Relation Age of Onset   Heart disease Mother 52       Deceased   Heart defect Mother        hx of rheumatic fever, valve replacement   Diabetes Son        type 1   Diabetes Father 87       Deceased   Healthy Brother        x1   Heart attack Sister        74, stent in LAD   Hypertension Sister    Diabetes Sister     Social History   Socioeconomic History   Marital status: Married    Spouse name: Not on file   Number of children: Not on file   Years of education: Not on file   Highest education level: Not on file  Occupational History   Not on file  Tobacco Use   Smoking status: Former    Current  packs/day: 2.00    Average packs/day: 2.0 packs/day for 25.0 years (50.0 ttl pk-yrs)    Types: Cigarettes    Passive exposure: Current   Smokeless tobacco: Never  Vaping Use   Vaping status: Never Used  Substance and Sexual Activity   Alcohol use: No    Comment: hx of alcoholism; quit 15 years ago    Drug use: No    Comment: cocaine,marijuana use in past quit 20 years ago    Sexual activity: Not on file  Other Topics Concern   Not on file  Social History Narrative   Works at [employer]   2 sons (one in [place] and one in [place])   2 stepdaughters   Enjoys golf   Married (second marriage)      Social Drivers of Corporate investment banker Strain: Low Risk  (03/20/2024)   Overall Financial Resource Strain (CARDIA)    Difficulty of Paying Living Expenses: Not hard at all  Food Insecurity: No Food Insecurity (03/20/2024)   Hunger Vital Sign    Worried About Running Out of Food in the Last Year: Never true    Ran Out of Food in the Last Year: Never true   Transportation Needs: No Transportation Needs (03/20/2024)   PRAPARE - Administrator, Civil Service (Medical): No    Lack of Transportation (Non-Medical): No  Physical Activity: Sufficiently Active (03/20/2024)   Exercise Vital Sign    Days of Exercise per Week: 4 days    Minutes of Exercise per Session: 100 min  Stress: No Stress Concern Present (03/20/2024)   [employer] of Occupational Health - Occupational Stress Questionnaire    Feeling of Stress: Not at all  Social Connections: Moderately Integrated (03/20/2024)   Social Connection and Isolation Panel    Frequency of Communication with Friends and Family: More than three times a week    Frequency of Social Gatherings with Friends and Family: More than three times a week    Attends Religious Services: Never    Database administrator or Organizations: No    Attends Engineer, structural: More than 4 times per year    Marital Status: Married  Catering manager Violence: Not At Risk (03/20/2024)   Humiliation, Afraid, Rape, and Kick questionnaire    Fear of Current or Ex-Partner: No    Emotionally Abused: No    Physically Abused: No    Sexually Abused: No    Outpatient Medications Prior to Visit  Medication Sig Dispense Refill   amLODipine  (NORVASC ) 5 MG tablet TAKE ONE AND ONE-HALF TABLETS BY MOUTH DAILY 135 tablet 0   aspirin  81 MG tablet Take 81 mg by mouth daily.     finasteride  (PROSCAR ) 5 MG tablet TAKE ONE TABLET BY MOUTH ONCE A DAY 90 tablet 0   meloxicam  (MOBIC ) 15 MG tablet TAKE ONE TABLET BY MOUTH ONCE A DAY 30 tablet 0   Multiple Vitamins-Minerals (MULTIVITAMIN WITH MINERALS) tablet Take 1 tablet by mouth daily.     sildenafil  (VIAGRA ) 100 MG tablet Take 1 tablet (100 mg total) by mouth daily as needed for erectile dysfunction. 10 tablet 5   tadalafil  (CIALIS ) 5 MG tablet Take 1 tablet (5 mg total) by mouth daily at 6 (six) AM. 30 tablet 11   tamsulosin  (FLOMAX ) 0.4 MG CAPS capsule TAKE ONE  CAPSULE BY MOUTH ONCE A DAY 90 capsule 0   hepatitis A vaccine pediatric (HAVRIX ) 720 EL U/0.5ML SUSP injection Repeat in 6-12 months  0.5 mL 1   typhoid (VIVOTIF) DR capsule (day 1, 3, 5, and 7) for a total of 4 doses; all doses should be complete at least 1 week prior to potential exposure. 4 capsule 0   No facility-administered medications prior to visit.    No Known Allergies  ROS See HPI    Objective:    Physical Exam Constitutional:      General: He is not in acute distress.    Appearance: He is well-developed.  HENT:     Head: Normocephalic and atraumatic.     Right Ear: Tympanic membrane and ear canal normal.     Left Ear: Tympanic membrane and ear canal normal.  Cardiovascular:     Rate and Rhythm: Normal rate and regular rhythm.     Heart sounds: No murmur heard. Pulmonary:     Effort: Pulmonary effort is normal. No respiratory distress.     Breath sounds: Normal breath sounds. No wheezing or rales.  Musculoskeletal:     Comments: R calf is smaller than L calf  Skin:    General: Skin is warm and dry.  Neurological:     Mental Status: He is alert and oriented to person, place, and time.     Deep Tendon Reflexes:     Reflex Scores:      Patellar reflexes are 2+ on the right side and 2+ on the left side.    Comments: Bilateral LE strength is 5/5  Psychiatric:        Behavior: Behavior normal.        Thought Content: Thought content normal.       BP 128/67 (BP Location: Right Arm, Patient Position: Sitting, Cuff Size: Large)   Pulse 77   Temp 98.7 F (37.1 C) (Oral)   Resp 16   Ht 6' 2 (1.88 m)   Wt 228 lb (103.4 kg)   SpO2 98%   BMI 29.27 kg/m  Wt Readings from Last 3 Encounters:  04/23/24 228 lb (103.4 kg)  03/20/24 225 lb (102.1 kg)  01/26/24 230 lb (104.3 kg)       Assessment & Plan:   Problem List Items Addressed This Visit       Unprioritized   Tinnitus   Much improved.  Normal ear exam. Monitor for now.      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       Atrophy of muscle of right lower leg - Primary   New. Will see about obtaining an MRI of the lumbar spine.       Relevant Orders   MR Lumbar Spine Wo Contrast    I have discontinued Lonni E. Mcever's typhoid and Havrix . I am also having him maintain his aspirin , tadalafil , multivitamin with minerals, sildenafil , meloxicam , tamsulosin , amLODipine , and finasteride .  No orders of the defined types were placed in this encounter.

## 2024-04-24 ENCOUNTER — Ambulatory Visit (HOSPITAL_BASED_OUTPATIENT_CLINIC_OR_DEPARTMENT_OTHER)
Admission: RE | Admit: 2024-04-24 | Discharge: 2024-04-24 | Disposition: A | Discharge status: Home / Self Care | Source: Ambulatory Visit | Attending: Family | Admitting: Family

## 2024-04-24 DIAGNOSIS — H9319 Tinnitus, unspecified ear: Secondary | ICD-10-CM | POA: Insufficient documentation

## 2024-04-24 DIAGNOSIS — M62561 Muscle wasting and atrophy, not elsewhere classified, right lower leg: Secondary | ICD-10-CM | POA: Insufficient documentation

## 2024-04-24 DIAGNOSIS — G8929 Other chronic pain: Secondary | ICD-10-CM | POA: Insufficient documentation

## 2024-04-24 NOTE — Patient Instructions (Signed)
 VISIT SUMMARY:  During your visit, we discussed the ringing in your left ear, the atrophy in your right leg, and your chronic low back pain. We also reviewed your ongoing treatment for a left rotator cuff tear and addressed a dermatological concern.  YOUR PLAN:  RIGHT LEG MUSCLE ATROPHY AND CHRONIC RIGHT ANKLE PAIN: You have muscle atrophy in your right leg along with chronic ankle pain and balance issues. Your neurological function is intact. -We will order an MRI of your lumbar spine to check for any nerve involvement that might be causing the muscle atrophy.  CHRONIC LOW BACK PAIN: You have chronic low back pain, especially on the right side. You previously found relief through yoga. -We will order an MRI of your lumbar spine to look for potential causes of your chronic low back pain. -I encourage you to return to yoga to help manage your back pain.  LEFT ROTATOR CUFF TEAR: You have a left rotator cuff tear and are currently undergoing physical therapy. Surgery was considered but you opted for therapy. -Continue with your physical therapy for your left rotator cuff tear.  GENERAL HEALTH MAINTENANCE: You have a history of melanoma and need a dermatological evaluation for an ear lesion. -We will refer you to a dermatologist for an evaluation of the ear lesion.

## 2024-04-24 NOTE — Assessment & Plan Note (Signed)
 Obtain MRI of the lumbar spine.

## 2024-04-24 NOTE — Assessment & Plan Note (Signed)
 Much improved.  Normal ear exam. Monitor for now.

## 2024-04-24 NOTE — Assessment & Plan Note (Signed)
 New. Will see about obtaining an MRI of the lumbar spine.

## 2024-04-26 ENCOUNTER — Other Ambulatory Visit: Payer: Self-pay | Admitting: Family

## 2024-05-08 ENCOUNTER — Encounter: Payer: Self-pay | Admitting: Family

## 2024-05-08 ENCOUNTER — Ambulatory Visit: Payer: Self-pay | Admitting: Family

## 2024-05-08 DIAGNOSIS — M4800 Spinal stenosis, site unspecified: Secondary | ICD-10-CM | POA: Insufficient documentation

## 2024-05-08 NOTE — Telephone Encounter (Signed)
Patient notified of results and referral 

## 2024-05-08 NOTE — Telephone Encounter (Signed)
 Please advise pt that MRI shows some narrowing of his spinal canal in the lumbar area as well as bulging discs and arthritis.  I would like to send him to speak with a neurosurgeon for further evaluation.

## 2024-08-20 ENCOUNTER — Other Ambulatory Visit: Payer: Self-pay | Admitting: Family

## 2024-08-26 ENCOUNTER — Encounter: Payer: Self-pay | Admitting: Neurology

## 2024-12-02 ENCOUNTER — Ambulatory Visit: Payer: Self-pay | Admitting: Neurology

## 2025-03-26 ENCOUNTER — Ambulatory Visit
